# Patient Record
Sex: Female | Born: 1935 | Race: White | Hispanic: No | Smoking: Never smoker
Health system: Southern US, Community
[De-identification: ages and names within clinical notes are randomized; demographics above are authoritative.]

## PROBLEM LIST (undated history)

## (undated) DIAGNOSIS — R9431 Abnormal electrocardiogram [ECG] [EKG]: Secondary | ICD-10-CM

## (undated) DIAGNOSIS — R51 Headache: Secondary | ICD-10-CM

## (undated) DIAGNOSIS — K219 Gastro-esophageal reflux disease without esophagitis: Secondary | ICD-10-CM

## (undated) DIAGNOSIS — N289 Disorder of kidney and ureter, unspecified: Secondary | ICD-10-CM

## (undated) DIAGNOSIS — Z860100 Personal history of colon polyps, unspecified: Secondary | ICD-10-CM

## (undated) DIAGNOSIS — M1990 Unspecified osteoarthritis, unspecified site: Secondary | ICD-10-CM

## (undated) DIAGNOSIS — E210 Primary hyperparathyroidism: Secondary | ICD-10-CM

## (undated) DIAGNOSIS — R0989 Other specified symptoms and signs involving the circulatory and respiratory systems: Secondary | ICD-10-CM

## (undated) DIAGNOSIS — E039 Hypothyroidism, unspecified: Secondary | ICD-10-CM

## (undated) DIAGNOSIS — F419 Anxiety disorder, unspecified: Secondary | ICD-10-CM

## (undated) DIAGNOSIS — I10 Essential (primary) hypertension: Secondary | ICD-10-CM

## (undated) HISTORY — DX: Primary hyperparathyroidism: E21.0

## (undated) HISTORY — DX: Anxiety disorder, unspecified: F41.9

## (undated) HISTORY — PX: TONSILLECTOMY AND ADENOIDECTOMY: SUR1326

## (undated) HISTORY — PX: APPENDECTOMY: SHX54

## (undated) HISTORY — DX: Disorder of kidney and ureter, unspecified: N28.9

## (undated) HISTORY — DX: Personal history of colonic polyps: Z86.010

## (undated) HISTORY — DX: Hypothyroidism, unspecified: E03.9

## (undated) HISTORY — DX: Headache: R51

## (undated) HISTORY — DX: Abnormal electrocardiogram (ECG) (EKG): R94.31

## (undated) HISTORY — DX: Essential (primary) hypertension: I10

## (undated) HISTORY — DX: Unspecified osteoarthritis, unspecified site: M19.90

## (undated) HISTORY — DX: Personal history of colon polyps, unspecified: Z86.0100

## (undated) HISTORY — DX: Other specified symptoms and signs involving the circulatory and respiratory systems: R09.89

## (undated) HISTORY — DX: Gastro-esophageal reflux disease without esophagitis: K21.9

---

## 2004-09-23 ENCOUNTER — Ambulatory Visit: Payer: Self-pay | Admitting: Pulmonary Disease

## 2004-10-05 ENCOUNTER — Ambulatory Visit: Payer: Self-pay | Admitting: Pulmonary Disease

## 2005-09-22 ENCOUNTER — Ambulatory Visit: Payer: Self-pay | Admitting: Pulmonary Disease

## 2006-10-16 ENCOUNTER — Ambulatory Visit: Payer: Self-pay | Admitting: Pulmonary Disease

## 2006-10-16 LAB — CONVERTED CEMR LAB
Albumin: 3.7 g/dL (ref 3.5–5.2)
Basophils Relative: 1 % (ref 0.0–1.0)
CO2: 27 meq/L (ref 19–32)
Calcium, Total (PTH): 10.7 mg/dL — ABNORMAL HIGH (ref 8.4–10.5)
Calcium: 11 mg/dL — ABNORMAL HIGH (ref 8.4–10.5)
Eosinophils Relative: 2.8 % (ref 0.0–5.0)
HCT: 41.8 % (ref 36.0–46.0)
Hemoglobin: 14.5 g/dL (ref 12.0–15.0)
Hgb A1c MFr Bld: 6.5 % — ABNORMAL HIGH (ref 4.6–6.0)
MCHC: 34.8 g/dL (ref 30.0–36.0)
MCV: 83.1 fL (ref 78.0–100.0)
Monocytes Absolute: 0.8 10*3/uL — ABNORMAL HIGH (ref 0.2–0.7)
Neutrophils Relative %: 62.1 % (ref 43.0–77.0)
PTH: 42.4 pg/mL (ref 14.0–72.0)
Platelets: 309 10*3/uL (ref 150–400)
Potassium: 3.7 meq/L (ref 3.5–5.1)
RBC: 5.03 M/uL (ref 3.87–5.11)
RDW: 12.7 % (ref 11.5–14.6)
Total Bilirubin: 0.9 mg/dL (ref 0.3–1.2)
Total Protein: 8.1 g/dL (ref 6.0–8.3)
WBC: 10.6 10*3/uL — ABNORMAL HIGH (ref 4.5–10.5)

## 2007-12-10 DIAGNOSIS — J45909 Unspecified asthma, uncomplicated: Secondary | ICD-10-CM

## 2007-12-10 DIAGNOSIS — K219 Gastro-esophageal reflux disease without esophagitis: Secondary | ICD-10-CM | POA: Insufficient documentation

## 2007-12-10 DIAGNOSIS — I10 Essential (primary) hypertension: Secondary | ICD-10-CM

## 2007-12-10 DIAGNOSIS — F419 Anxiety disorder, unspecified: Secondary | ICD-10-CM

## 2007-12-11 ENCOUNTER — Ambulatory Visit: Payer: Self-pay | Admitting: Family Medicine

## 2007-12-11 ENCOUNTER — Ambulatory Visit: Payer: Self-pay | Admitting: Pulmonary Disease

## 2007-12-11 DIAGNOSIS — D126 Benign neoplasm of colon, unspecified: Secondary | ICD-10-CM | POA: Insufficient documentation

## 2007-12-11 DIAGNOSIS — E039 Hypothyroidism, unspecified: Secondary | ICD-10-CM

## 2007-12-12 ENCOUNTER — Telehealth: Payer: Self-pay | Admitting: Pulmonary Disease

## 2007-12-23 DIAGNOSIS — M1990 Unspecified osteoarthritis, unspecified site: Secondary | ICD-10-CM | POA: Insufficient documentation

## 2007-12-23 LAB — CONVERTED CEMR LAB
BUN: 32 mg/dL — ABNORMAL HIGH (ref 6–23)
Basophils Absolute: 0.1 10*3/uL (ref 0.0–0.1)
Basophils Relative: 0.8 % (ref 0.0–1.0)
Bilirubin, Direct: 0.1 mg/dL (ref 0.0–0.3)
Calcium, Total (PTH): 10.6 mg/dL — ABNORMAL HIGH (ref 8.4–10.5)
Chloride: 105 meq/L (ref 96–112)
Eosinophils Absolute: 0.2 10*3/uL (ref 0.0–0.6)
Eosinophils Relative: 2.1 % (ref 0.0–5.0)
GFR calc Af Amer: 35 mL/min
GFR calc non Af Amer: 29 mL/min
Lymphocytes Relative: 29.9 % (ref 12.0–46.0)
MCHC: 31.9 g/dL (ref 30.0–36.0)
Monocytes Absolute: 0.5 10*3/uL (ref 0.2–0.7)
Neutro Abs: 5.8 10*3/uL (ref 1.4–7.7)
PTH: 50.2 pg/mL (ref 14.0–72.0)
Phosphorus: 3.2 mg/dL (ref 2.3–4.6)
Platelets: 247 10*3/uL (ref 150–400)
Potassium: 3.8 meq/L (ref 3.5–5.1)
RDW: 13.5 % (ref 11.5–14.6)
Sodium: 140 meq/L (ref 135–145)
Total Bilirubin: 0.8 mg/dL (ref 0.3–1.2)
VLDL: 28 mg/dL (ref 0–40)
Vit D, 1,25-Dihydroxy: 34 (ref 30–89)

## 2008-01-22 ENCOUNTER — Encounter: Payer: Self-pay | Admitting: Pulmonary Disease

## 2009-02-25 ENCOUNTER — Ambulatory Visit: Payer: Self-pay | Admitting: Pulmonary Disease

## 2009-02-26 DIAGNOSIS — N259 Disorder resulting from impaired renal tubular function, unspecified: Secondary | ICD-10-CM

## 2009-02-26 DIAGNOSIS — M810 Age-related osteoporosis without current pathological fracture: Secondary | ICD-10-CM | POA: Insufficient documentation

## 2009-02-28 LAB — CONVERTED CEMR LAB
AST: 17 units/L (ref 0–37)
Albumin: 3.9 g/dL (ref 3.5–5.2)
Basophils Absolute: 0 10*3/uL (ref 0.0–0.1)
Basophils Relative: 0.4 % (ref 0.0–3.0)
Bilirubin, Direct: 0.1 mg/dL (ref 0.0–0.3)
Calcium, Total (PTH): 11.2 mg/dL — ABNORMAL HIGH (ref 8.4–10.5)
Eosinophils Absolute: 0.2 10*3/uL (ref 0.0–0.7)
Eosinophils Relative: 2.5 % (ref 0.0–5.0)
Glucose, Bld: 138 mg/dL — ABNORMAL HIGH (ref 70–99)
Hgb A1c MFr Bld: 6 % (ref 4.6–6.5)
Lymphocytes Relative: 26.3 % (ref 12.0–46.0)
Lymphs Abs: 2.2 10*3/uL (ref 0.7–4.0)
MCV: 83 fL (ref 78.0–100.0)
Monocytes Absolute: 0.5 10*3/uL (ref 0.1–1.0)
Monocytes Relative: 5.6 % (ref 3.0–12.0)
Neutrophils Relative %: 65.2 % (ref 43.0–77.0)
Phosphorus: 3.7 mg/dL (ref 2.3–4.6)
Platelets: 259 10*3/uL (ref 150.0–400.0)
RDW: 12.6 % (ref 11.5–14.6)
TSH: 1.27 microintl units/mL (ref 0.35–5.50)
Total Bilirubin: 0.8 mg/dL (ref 0.3–1.2)
Total Protein: 7.9 g/dL (ref 6.0–8.3)

## 2009-03-31 ENCOUNTER — Ambulatory Visit: Payer: Self-pay | Admitting: Pulmonary Disease

## 2010-01-27 ENCOUNTER — Ambulatory Visit: Payer: Self-pay | Admitting: Pulmonary Disease

## 2010-01-27 DIAGNOSIS — R9431 Abnormal electrocardiogram [ECG] [EKG]: Secondary | ICD-10-CM

## 2010-01-27 DIAGNOSIS — R0989 Other specified symptoms and signs involving the circulatory and respiratory systems: Secondary | ICD-10-CM | POA: Insufficient documentation

## 2010-01-31 LAB — CONVERTED CEMR LAB
Albumin: 4.1 g/dL (ref 3.5–5.2)
Alkaline Phosphatase: 67 units/L (ref 39–117)
CO2: 28 meq/L (ref 19–32)
Chloride: 105 meq/L (ref 96–112)
Creatinine, Ser: 1.5 mg/dL — ABNORMAL HIGH (ref 0.4–1.2)
Eosinophils Relative: 0.7 % (ref 0.0–5.0)
GFR calc non Af Amer: 35.54 mL/min (ref >60)
HCT: 38.3 % (ref 36.0–46.0)
Hemoglobin: 13 g/dL (ref 12.0–15.0)
Hgb A1c MFr Bld: 5.8 % (ref 4.6–6.5)
Lymphocytes Relative: 18.9 % (ref 12.0–46.0)
MCHC: 33.8 g/dL (ref 30.0–36.0)
Neutro Abs: 6.6 10*3/uL (ref 1.4–7.7)
Neutrophils Relative %: 76.9 % (ref 43.0–77.0)
Platelets: 215 10*3/uL (ref 150.0–400.0)
Potassium: 3.6 meq/L (ref 3.5–5.1)
RBC: 4.47 M/uL (ref 3.87–5.11)
Total Protein: 7 g/dL (ref 6.0–8.3)

## 2010-02-02 LAB — CONVERTED CEMR LAB
Calcium, Total (PTH): 10.5 mg/dL (ref 8.4–10.5)
Vit D, 25-Hydroxy: 61 ng/mL (ref 30–89)

## 2010-10-17 ENCOUNTER — Encounter: Payer: Self-pay | Admitting: Pulmonary Disease

## 2010-10-27 NOTE — Assessment & Plan Note (Signed)
Summary: rov/apc   CC:  10 month ROV & review of mult medical problems....  History of Present Illness: [name] here for a follow up visit... she has mult med problems as listed below...    ~  Jun10:  she states that BP & BS have been doing well at home- taking meds regularly and tolerating well... started on Alendronate for Osteoporosis 3/09 and taking this weekly & tol well... after her last visit 3/09 she was supposed to see her GYN for check up (never went) & get an appt w/ GI for a follow up colonoscopy (never went)... she was also supposed to f/u in 6 months and never returned as scheduled... she states she's had a good yr & been very busy raising her grandkids... her CC is "my feet" c/o pain and walks w/ crutches... prev eval by [facility]- DrCollins...      ** labs ret w/ BS=138, A1c=6.0, BUN=46, Creat=2.2, otherw OK- we decided to stop Maxzide, add Prednisone 10mg /d...  ~  Jul10:  she reports feeling much better w/ the 10mg Pred- breathing "the best in yrs" & arthritis improved... due for f/u lab off the Maxzide... try to wean the Pred to 06-30-09-5...      ** labs improved w/ BS=107, Creat=1.6 off Maxzide.   ~  Jan 27, 2010:  she never ret for f/u visit & weaned the Pred on her own to 1/2 tab=5mg  Qod... she notes breathing has remained good & arthritis improved- "all except my feet" & still walks w/ canes (offered eval by [facility] & she will think about it)...  she reports Asthma stable on meds;  BP controlled on Lisinopril w/o side effects;  remains on Metformin daily & not checking BS at home;  due for f/u thyroid & parathyroid labs, etc <SEE BELOW> she refuses yearly Flu vaccines but will accept a TDAP today.    Current Problems:   ASTHMA (ICD-493.90) - she is a non-smoker... takes TheoDur 300mg /d, PROAIR HFA 2spBid &Prn, FLOVENT 220 2spBid, & the PREDNISONE 10mg /d- now 1/2 Qod... no exas, doing well, no recent URI, etc... denies cough, sputum, hemoptysis, worsening dyspnea,   wheezing, chest pains, snoring, daytime hypersomnolence, etc...  ~  baseline CXR w/ hyperinflation, tort Ao, NAD... f/u film 3/09 showed COPD, NAD.Marland Kitchen.  ~  CXR 5/11 showed enlarged heart, ectatic Ao, hyperinflation, NAD...  HYPERTENSION (ICD-401.9) - on LISINOPRIL 10mg /d... BP=138/72, tol Rx well, no problems... denies HA, fatigue, visual changes, CP, palipit, dizziness, syncope, dyspnea, etc... notes tr edema intermittently.  ~  cardiac cath 1991 was neg- norm coronaries (she may have had spasm).  ~  labs 3/09 showed K=3.8, BUN= 32, Creat= 1.8  ~  labs 6/10 showed K= 4.4, BUN= 46, Creat= 2.2.Marland Kitchen. rec> stop Maxzide & KCl, ROV 39mo.  ~  labs 7/10 showed K= 4.6, BUN= 58, Creat= 1.6.Marland Kitchen. rec> incr fluid intake, stay off Maxzide.  ABNORMAL ELECTROCARDIOGRAM (ICD-794.31) - EKG shows NSR, IVCD, LAD, poor R progression...  ~  2DEcho 5/11 showed > pending  CAROTID BRUIT (ICD-785.9) - on ASA 81mg /d... she denies cerebral ischemic symptoms...  ~  CDopplers 5/11 showed > pending  DIABETES MELLITUS (ICD-250.00) - on METFORMIN ER 500mg /d... tol well, no hypogly reactions etc...  ~  labs 1/08 showed BS=172, HgA1c=6.5 & Metformin 500mg /d started...  ~  labs 3/09 showed BS= 99, A1c= 5.7  ~  labs 6/10 showed BS= 138, A1c= 6.0.Marland Kitchen. rec> same med.  ~  labs 5/11 showed BS= 141, A1c= 5.8  HYPOTHYROIDISM (ICD-244.9) - on LEVOTHYROID 75 micrograms/d... clinically Euthyroid w/ good energy.  ~  labs 1/08 showed TSH=1.84 on 75 micrograms/d...  ~  labs 3/09 showed TSH= 5.05  ~  labs 6/10 showed TSH= 1.27  ~  labs 5/11 showed TSH= 0.64  ? of PRIMARY HYPERPARATHYROIDISM (ICD-252.01) - Ca++ levels in the 10.6-11.0 range... PO4- levels in the 2.1-2.4 range... PTH levels slowly rising 40-65... therefore following clinically w/ serial labs due now.  ~  labs 1/08 showed:  ca++ 11.0, PTH 42.4.Marland KitchenMarland KitchenMarland Kitchen  ~  labs 3/09 showed Ca= 10.3, PO4= 3.2, PTH= 50  ~  labs 6/10 showed Ca= 10.9, PO4= 3.7, PTH= 65  ~  labs 5/11 showed Ca= 10.5-10.9,  Phos= 3.0, PTH= 111... rec> continue to follow closely.  GERD (ICD-530.81) - remote hx of PUD w/ GIB in the 80's... eval by DrDBrodie in the past... prev on Zantac and Reglan but hasn't filled these since 2009... denies nausea, vomiting, heartburn, diarrhea, constipation, blood in stool, abdominal pain, swelling, gas... she is rec to use OTC Prilosec vs Zantac Prn & avoid Tums etc...  COLONIC POLYPS (ICD-211.3) - there is a pos family history of colon cancer (in brother)... last colonoscopy was 5/98 [facility]) showing diminutive polyp (tubular adenoma)... f/u planned 3-5 yrs, but she never went... she is overdue and needs to sched this important procedure & we will refer to GI for their review...  RENAL INSUFFICIENCY (ICD-588.9)   ~  labs 1/08 showed BUN= 21, Creat= 1.2  ~  labs 3/09 showed BUN= 32, Creat= 1.8  ~  labs 6/10 showed BUN= 46, Creat= 2.2.Marland KitchenMarland Kitchen rec to stop Maxzide (on 1/2 daily)  ~  labs 7/10 showed BUN= 58, Creat= 1.6.Marland Kitchen. rec> incr fluid intake.  ~  labs 5/11 showed BUN= 28, Creat= 1.5  HEADACHE (ICD-784.0)  DEGENERATIVE JOINT DISEASE (ICD-715.90) - mod DJD and walks w/ crutches "because of my ankles"... she's seen DrCollins [facility] in the past... started on low dose Pred 6/10 for this & Asthma> still on PRED 10mg - 1/2 Qod...  ~  6/10: CC= "my feet" c/o arthritic pain in feet & ankles... rec> trial Pred 10mg /d w/ ROV 76mo.  ~  7/10: pt states improved on the Pred which has been weaned...  OSTEOPOROSIS (ICD-733.00) - currently taking FOSAMAX 70mg /wk, no calcium supplement due to borderline hypercalcemia, MVI + Vit D 1000 u OTC supplement.  ~  BMD 3/09 here showed TScores -2.9 in Spine, and -2.9 in right FemNeck... started on Alendronate 70mg /wk.  ~  Vit D level 3/09 = 34... rec to start Vit D 1000 u OTC supplement.  ~  Vit D level 6/10 = 50  ~  Vit D level 5/11 = 61  ANXIETY (ICD-300.00)  HEALTH MAINTENANCE:  ~  GYN: she hasn't seen GYN (DrCollins) in yrs and needs PAP,  Mammogram, etc... 3/09- reminded to call for GYN follow up but she never went... pt reminded again 6/10 & 5/11 to call & set this up.  ~  GI:  last colonoscopy 1998 at [facility] by [facility]- sm adenoma removed... advised f/u 29yrs and she never went... referral made to [facility] for this important procedure.  ~  Immunizations:  had Pneumovax 2001... Tetanus (TDAP) given 5/11... she refuses the seasonal Flu vaccines & is reminded of the importance of these yearly vaccinations.   Current Medications (verified): 1)  Prednisone 10 Mg Tabs (Prednisone) .... Take 1 Tab By Mouth One Day & 1/2 Tab The Next... 2)  Proair Hfa 108 (90  Base) Mcg/act  Aers (Albuterol Sulfate) .... 2 Puffs Four Times A Day As Directed... 3)  Flovent Hfa 220 Mcg/act  Aero (Fluticasone Propionate  Hfa) .... Inhale 2 Puffs Two Times A Day 4)  Theophylline Cr 300 Mg  Tb12 (Theophylline) .... Take 1 By Mouth Daily.Marland KitchenMarland Kitchen 5)  Adult Aspirin Low Strength 81 Mg  Tbdp (Aspirin) .... Take 1 By Mouth Once Daily 6)  Lisinopril 10 Mg  Tabs (Lisinopril) .... Take 1 By Mouth Once Daily 7)  Metformin Hcl 500 Mg  Tb24 (Metformin Hcl) .... Take 1 Tablet By Mouth Once A Day 8)  Levothroid 75 Mcg  Tabs (Levothyroxine Sodium) .... Take 1 By Mouth Once Daily 9)  Fosamax 70 Mg  Tabs (Alendronate Sodium) .... Take One Tablet By Mouth Every Week 10)  Vitamin D 1000 Unit Tabs (Cholecalciferol) .... Take 1 Tablet By Mouth Once A Day  Allergies (verified): 1)  ! Augmentin 2)  ! Codeine 3)  ! Keflex  Past History:  Past Medical History: ASTHMA (ICD-493.90) HYPERTENSION (ICD-401.9) ABNORMAL ELECTROCARDIOGRAM (ICD-794.31) CAROTID BRUIT (ICD-785.9) DIABETES MELLITUS (ICD-250.00) HYPOTHYROIDISM (ICD-244.9) ? of PRIMARY HYPERPARATHYROIDISM (ICD-252.01) GERD (ICD-530.81) COLONIC POLYPS (ICD-211.3) RENAL INSUFFICIENCY (ICD-588.9) HEADACHE (ICD-784.0) DEGENERATIVE JOINT DISEASE (ICD-715.90) OSTEOPOROSIS (ICD-733.00) ANXIETY (ICD-300.00)  Past Surgical  History: S/P T & A S/P appendectomy  Family History: Reviewed history from 03/31/2009 and no changes required. mother deceased age 84 from a cerebral hemorrhage father deceased age 55 from pneumonia/CHF 7 Siblings- 2 Sis & 5 Bro: 1 sister alive  1 sister deceased age 41 from chf 1 brother deceased age 62 from an accidental shooting 1 brother deceased in his 49's with lung cancer 1 brother deceased in his 40's with leukemia 1 brother deceased in his 55's with colon cancer 1 brother deceased in his 36's with stomach cancer  Social History: Reviewed history from 03/31/2009 and no changes required. never smoked never used alcohol no exercise due to arthritis no caffeine use  widowed 6 children  Review of Systems      See HPI       The patient complains of decreased hearing, dyspnea on exertion, and difficulty walking.  The patient denies anorexia, fever, weight loss, weight gain, vision loss, hoarseness, chest pain, syncope, peripheral edema, prolonged cough, headaches, hemoptysis, abdominal pain, melena, hematochezia, severe indigestion/heartburn, hematuria, incontinence, muscle weakness, suspicious skin lesions, transient blindness, depression, unusual weight change, abnormal bleeding, enlarged lymph nodes, and angioedema.    Vital Signs:  Patient profile:   77 year old female Height:      66 inches Weight:      142.0 pounds BMI:     23.00 O2 Sat:      98 % on room air Temp:     98.0 degrees F oral Pulse rate:   80 / minute BP sitting:   126 / 74  (left arm) Cuff size:   regular  Vitals Entered By: Denna Haggard, CMA (Jan 27, 2010 2:31 PM)  O2 Sat at Rest %:  98% O2 Flow:  room air  Physical Exam  Additional Exam:  WD, WN, [name] in NAD... GENERAL:  Alert & oriented; pleasant & cooperative... HEENT:  [place]/AT, EOM-wnl, PERRLA, EACs-clear, TMs-wnl, NOSE-clear, THROAT-clear & wnl. NECK:  Supple w/ fairROM; no JVD; normal carotid impulses w/ 1+ bilat bruits; no  thyromegaly or nodules palpated; no lymphadenopathy. CHEST:  decr BS bilat, clear to P & A; without wheezes or rales; + scat rhonchi heard...  HEART:  Regular Rhythm; +gr1-2  sys murmur, no rubs or gallops detected... ABDOMEN:  Soft & nontender; normal bowel sounds; no organomegaly or masses palpated... EXT: without deformities, mild arthritic changes; no varicose veins/ venous insuffic/ or edema. +gait abn using 2 canes, stooped over, etc... NEURO:  CN's intact;  no focal neuro deficits... DERM:  No lesions noted; no rash etc...    CXR  Procedure date:  01/27/2010  Findings:      CHEST - 2 VIEW Comparison:  chest radiograph 12/11/1998   Findings: Stable enlarged heart silhouette with ectatic aorta. Lungs are hyperinflated.  No evidence effusion, infiltrate, or pneumothorax.  No acute bony abnormality.   IMPRESSION:   1.  Stable exam of the chest. 2.  Hyperinflation suggests emphysema.   Read By:  Genevive Bi,  M.D.   EKG  Procedure date:  01/27/2010  Findings:      Normal sinus rhythm with rate of:  88/min... Abn tracing w/ IVCD, LAD, poor R progression...  SN   MISC. Report  Procedure date:  01/27/2010  Findings:      BMP (METABOL)   Sodium                    140 mEq/L                   135-145   Potassium                 3.6 mEq/L                   3.5-5.1   Chloride                  105 mEq/L                   96-112   Carbon Dioxide            28 mEq/L                    19-32   Glucose              [H]  141 mg/dL                   57-32   BUN                  [H]  28 mg/dL                    2-02   Creatinine           [H]  1.5 mg/dL                   5.4-2.7   Calcium              [H]  10.9 mg/dL                  0.6-23.7   GFR                       35.54 mL/min                >60  Hepatic/Liver Function Panel (HEPATIC)   Total Bilirubin           0.5 mg/dL                   6.2-8.3   Direct Bilirubin          0.1  mg/dL                   3.0-8.6    Alkaline Phosphatase      67 U/L                      39-117   AST                       17 U/L                      0-37   ALT                       13 U/L                      0-35   Total Protein             7.0 g/dL                    5.7-8.4   Albumin                   4.1 g/dL                    6.9-6.2  CBC Platelet w/Diff (CBCD)   White Cell Count          8.6 K/uL                    4.5-10.5   Red Cell Count            4.47 Mil/uL                 3.87-5.11   Hemoglobin                13.0 g/dL                   95.2-84.1   Hematocrit                38.3 %                      36.0-46.0   MCV                       85.8 fl                     78.0-100.0   Platelet Count            215.0 K/uL                  150.0-400.0   Neutrophil %              76.9 %                      43.0-77.0   Lymphocyte %              18.9 %                      12.0-46.0   Monocyte %                3.2 %                       3.0-12.0  Eosinophils%              0.7 %                       0.0-5.0   Basophils %               0.3 %                       0.0-3.0  Comments:      TSH (TSH)   FastTSH                   0.64 uIU/mL                 0.35-5.50  Phosphorus (PHOS)   Phosphorus                3.0 mg/dL                   1.6-1.0  Hemoglobin A1C (A1C)   Hemoglobin A1C            5.8 %                       4.6-6.5  Vitamin D (25-Hydroxy) (96045)  Vitamin D (25-Hydroxy)                             61 ng/mL                    30-89  Parathyroid Hormone, Intact with Ca (40981)   Parathyroid Hormone  [H]  110.6 pg/mL                 14.0-72.0   Calcium                   10.5 mg/dL                  1.9-14.7   Impression & Recommendations:  Problem # 1:  ASTHMA (ICD-493.90) Stable on the PRED 5mg  Qod, Advair Bid, Theodur (she wants to continue), & the Proair Prn... we decided to continue the same Rx. Her updated medication list for this problem includes:    Prednisone 10 Mg Tabs (Prednisone)  .Marland Kitchen... Take 1/2 tab by mouth every other day.    Proair Hfa 108 (90 Base) Mcg/act Aers (Albuterol sulfate) .Marland Kitchen... 2 puffs four times a day as directed...    Flovent Hfa 220 Mcg/act Aero (Fluticasone propionate  hfa) ..... Inhale 2 puffs two times a day    Theophylline Cr 300 Mg Tb12 (Theophylline) .Marland Kitchen... Take 1 by mouth daily...  Orders: T-2 View CXR (71020TC)  Problem # 2:  HYPERTENSION (ICD-401.9) Controlled on med & tol well-  continue the same for now. Her updated medication list for this problem includes:    Lisinopril 10 Mg Tabs (Lisinopril) .Marland Kitchen... Take 1 by mouth once daily  Orders: 12 Lead EKG (12 Lead EKG) T-2 View CXR (71020TC) TLB-BMP (Basic Metabolic Panel-BMET) (80048-METABOL) TLB-Hepatic/Liver Function Pnl (80076-HEPATIC) TLB-CBC Platelet - w/Differential (85025-CBCD) TLB-TSH (Thyroid Stimulating Hormone) (84443-TSH) TLB-Phosphorus (84100-PHOS) TLB-A1C / Hgb A1C (Glycohemoglobin) (83036-A1C) T-Parathyroid Hormone, Intact w/ Calcium (82956-21308) T-Vitamin D (25-Hydroxy) (65784-69629) 2 D Echo (2 D Echo)  Problem # 3:  ABNORMAL ELECTROCARDIOGRAM (ICD-794.31) She denies CP, palpit, ch in SOB, swelling, etc... we will check 2DEcho & see if further cardiac eval is [employer]  at this time... Orders: 2 D Echo (2 D Echo)  Problem # 4:  CAROTID BRUIT (ICD-785.9) She is asymptomatic on the ASA... we will check CDopplers & notify pt of the results when avail... Orders: 2 D Echo (2 D Echo) Carotid Doppler (Carotid doppler)  Problem # 5:  DIABETES MELLITUS (ICD-250.00) Stable on the Metformin + diet Rx... continue same for now. Her updated medication list for this problem includes:    Adult Aspirin Low Strength 81 Mg Tbdp (Aspirin) .Marland Kitchen... Take 1 by mouth once daily    Lisinopril 10 Mg Tabs (Lisinopril) .Marland Kitchen... Take 1 by mouth once daily    Metformin Hcl 500 Mg Tb24 (Metformin hcl) .Marland Kitchen... Take 1 tablet by mouth once a day  Problem # 6:  HYPOTHYROIDISM (ICD-244.9) Stable on the  Levothy75-  continue same for now. Her updated medication list for this problem includes:    Levothroid 75 Mcg Tabs (Levothyroxine sodium) .Marland Kitchen... Take 1 by mouth once daily  Problem # 7:  ? of PRIMARY HYPERPARATHYROIDISM (ICD-252.01) Calcium at 10.9= no change... PTH shows steady rise c/w parathyroid adenoma & we are following...  Problem # 8:  COLONIC POLYPS (ICD-211.3) GI>  she is overdue for colonoscopy w/ hx polyps and brother w/ colon cancer... Orders: Gastroenterology Referral (GI)  Problem # 9:  RENAL INSUFFICIENCY (ICD-588.9) Labs stable to improved...  Problem # 10:  DEGENERATIVE JOINT DISEASE (ICD-715.90) Offered referral to Ortho drBednarz to check feet & she will decide... Her updated medication list for this problem includes:    Adult Aspirin Low Strength 81 Mg Tbdp (Aspirin) .Marland Kitchen... Take 1 by mouth once daily  Complete Medication List: 1)  Prednisone 10 Mg Tabs (Prednisone) .... Take 1/2 tab by mouth every other day. 2)  Proair Hfa 108 (90 Base) Mcg/act Aers (Albuterol sulfate) .... 2 puffs four times a day as directed... 3)  Flovent Hfa 220 Mcg/act Aero (Fluticasone propionate  hfa) .... Inhale 2 puffs two times a day 4)  Theophylline Cr 300 Mg Tb12 (Theophylline) .... Take 1 by mouth daily.Marland KitchenMarland Kitchen 5)  Adult Aspirin Low Strength 81 Mg Tbdp (Aspirin) .... Take 1 by mouth once daily 6)  Lisinopril 10 Mg Tabs (Lisinopril) .... Take 1 by mouth once daily 7)  Metformin Hcl 500 Mg Tb24 (Metformin hcl) .... Take 1 tablet by mouth once a day 8)  Levothroid 75 Mcg Tabs (Levothyroxine sodium) .... Take 1 by mouth once daily 9)  Fosamax 70 Mg Tabs (Alendronate sodium) .... Take one tablet by mouth every week 10)  Womens Multivitamin Plus Tabs (Multiple vitamins-minerals) .... Take 1 tab by mouth once daily. 11)  Vitamin D 1000 Unit Tabs (Cholecalciferol) .... Take 1 tablet by mouth once a day  Other Orders: Tdap => 68yrs IM (64403) Admin 1st Vaccine (47425)  Patient Instructions: 1)   Today we updated your med list- see below.... 2)  Keep the Prednisone at 1/2 tab every other day... 3)  Today we did your follow up CXR, EKG, & blood work... please call the "phone tree" in a few days for your lab results.Marland KitchenMarland Kitchen 4)  We will sched for you a 2D Echocardiogram to better assess the heart murmur & a Carotid doppler to be sure you are not at increased risk of a stroke... we will call you w/ these reports when avail.Marland KitchenMarland Kitchen 5)  We gave you the combination Tetanus shot today called the TDAP (it should be good for 31yrs)... 6)  Try to stay as active as possible... 7)  Call  for any problems... 8)  Please schedule a follow-up appointment in 6 months.   [facility] ECG  ID: [id number] Patient: Ashley Norman, Ashley Norman DOB: [birth year] Age: 77 Years Old Sex: Female Race: White Physician: Dr Kriste Basque Technician: Denna Haggard, CMA Height: 66 Weight: 142.0 Status: Unconfirmed Past Medical History:  ASTHMA (ICD-493.90) HYPERTENSION (ICD-401.9) DIABETES MELLITUS (ICD-250.00) HYPOTHYROIDISM (ICD-244.9) ? of PRIMARY HYPERPARATHYROIDISM (ICD-252.01) GERD (ICD-530.81) COLONIC POLYPS (ICD-211.3) RENAL INSUFFICIENCY (ICD-588.9) HEADACHE (ICD-784.0) DEGENERATIVE JOINT DISEASE (ICD-715.90) OSTEOPOROSIS (ICD-733.00) ANXIETY (ICD-300.00)   Recorded: 01/27/2010 3:31 PM P/PR: 113 ms / 157 ms - Heart rate (maximum exercise) QRS: 110 QT/QTc/QTd: 363 ms / 410 ms / 54 ms - Heart rate (maximum exercise)  P/QRS/T axis: 79 deg / -68 deg / 75 deg - Heart rate (maximum exercise)  Heartrate: 87 bpm  Interpretation:  Normal sinus rhythm with rate of:  88/min... Abn tracing w/ IVCD, LAD, poor R progression...  SN     Orders Added: 1)  Est. Patient Level V [99215] 2)  12 Lead EKG [12 Lead EKG] 3)  T-2 View CXR [71020TC] 4)  TLB-BMP (Basic Metabolic Panel-BMET) [80048-METABOL] 5)  TLB-Hepatic/Liver Function Pnl [80076-HEPATIC] 6)  TLB-CBC Platelet - w/Differential [85025-CBCD] 7)  TLB-TSH  (Thyroid Stimulating Hormone) [84443-TSH] 8)  TLB-Phosphorus [84100-PHOS] 9)  TLB-A1C / Hgb A1C (Glycohemoglobin) [83036-A1C] 10)  T-Parathyroid Hormone, Intact w/ Calcium [74259-56387] 11)  T-Vitamin D (25-Hydroxy) [56433-29518] 12)  2 D Echo [2 D Echo] 13)  Carotid Doppler [Carotid doppler] 14)  Tdap => 53yrs IM [90715] 15)  Admin 1st Vaccine [84166] 16)  Gastroenterology Referral [GI]    Immunizations Administered:  Tetanus Vaccine:    Vaccine Type: Tdap    Site: left deltoid    Mfr: boostrix    Dose: 0.5 ml    Route: IM    Given by: Randell Loop CMA    Exp. Date: 12/19/2011    Lot #: [id number]    VIS given: 08/14/07 version given Jan 27, 2010.

## 2010-12-02 ENCOUNTER — Ambulatory Visit: Payer: Self-pay | Admitting: Pulmonary Disease

## 2010-12-03 ENCOUNTER — Telehealth: Payer: Self-pay | Admitting: Pulmonary Disease

## 2010-12-03 ENCOUNTER — Ambulatory Visit: Payer: Self-pay | Admitting: Pulmonary Disease

## 2010-12-07 NOTE — Progress Notes (Addendum)
Summary: nos appt  Phone Note Call from Patient   Caller: [email or web address]  Call For: Any Mcneice Summary of Call: ATC pt to rsc nos from 3/8 nos vm. Initial call taken by: Darletta Moll,  December 03, 2010 10:45 AM

## 2010-12-21 ENCOUNTER — Other Ambulatory Visit: Payer: Self-pay | Admitting: Pulmonary Disease

## 2011-01-24 ENCOUNTER — Ambulatory Visit (INDEPENDENT_AMBULATORY_CARE_PROVIDER_SITE_OTHER)
Admission: RE | Admit: 2011-01-24 | Discharge: 2011-01-24 | Disposition: A | Discharge status: Home / Self Care | Payer: Medicare Other | Source: Ambulatory Visit | Attending: Pulmonary Disease | Admitting: Pulmonary Disease

## 2011-01-24 ENCOUNTER — Other Ambulatory Visit (INDEPENDENT_AMBULATORY_CARE_PROVIDER_SITE_OTHER): Payer: Medicare Other

## 2011-01-24 ENCOUNTER — Ambulatory Visit (INDEPENDENT_AMBULATORY_CARE_PROVIDER_SITE_OTHER)
Admission: RE | Admit: 2011-01-24 | Discharge: 2011-01-24 | Disposition: A | Discharge status: Home / Self Care | Payer: Medicare Other | Source: Ambulatory Visit

## 2011-01-24 ENCOUNTER — Encounter: Payer: Self-pay | Admitting: Pulmonary Disease

## 2011-01-24 ENCOUNTER — Ambulatory Visit (INDEPENDENT_AMBULATORY_CARE_PROVIDER_SITE_OTHER): Payer: Medicare Other | Admitting: Pulmonary Disease

## 2011-01-24 DIAGNOSIS — R0989 Other specified symptoms and signs involving the circulatory and respiratory systems: Secondary | ICD-10-CM

## 2011-01-24 DIAGNOSIS — N259 Disorder resulting from impaired renal tubular function, unspecified: Secondary | ICD-10-CM

## 2011-01-24 DIAGNOSIS — F411 Generalized anxiety disorder: Secondary | ICD-10-CM

## 2011-01-24 DIAGNOSIS — M810 Age-related osteoporosis without current pathological fracture: Secondary | ICD-10-CM

## 2011-01-24 DIAGNOSIS — J45909 Unspecified asthma, uncomplicated: Secondary | ICD-10-CM

## 2011-01-24 DIAGNOSIS — I10 Essential (primary) hypertension: Secondary | ICD-10-CM

## 2011-01-24 DIAGNOSIS — M1990 Unspecified osteoarthritis, unspecified site: Secondary | ICD-10-CM

## 2011-01-24 DIAGNOSIS — E039 Hypothyroidism, unspecified: Secondary | ICD-10-CM

## 2011-01-24 DIAGNOSIS — E119 Type 2 diabetes mellitus without complications: Secondary | ICD-10-CM

## 2011-01-24 DIAGNOSIS — E7800 Pure hypercholesterolemia, unspecified: Secondary | ICD-10-CM

## 2011-01-24 DIAGNOSIS — K219 Gastro-esophageal reflux disease without esophagitis: Secondary | ICD-10-CM

## 2011-01-24 DIAGNOSIS — E213 Hyperparathyroidism, unspecified: Secondary | ICD-10-CM | POA: Insufficient documentation

## 2011-01-24 LAB — BASIC METABOLIC PANEL
Calcium: 11 mg/dL — ABNORMAL HIGH (ref 8.4–10.5)
GFR: 38.39 mL/min — ABNORMAL LOW (ref >60.00)
Glucose, Bld: 100 mg/dL — ABNORMAL HIGH (ref 70–99)
Sodium: 136 mEq/L (ref 135–145)

## 2011-01-24 LAB — CBC WITH DIFFERENTIAL/PLATELET
Basophils Absolute: 0 10*3/uL (ref 0.0–0.1)
Eosinophils Relative: 6.5 % — ABNORMAL HIGH (ref 0.0–5.0)
HCT: 39.5 % (ref 36.0–46.0)
Hemoglobin: 13.5 g/dL (ref 12.0–15.0)
Lymphs Abs: 3 10*3/uL (ref 0.7–4.0)
MCV: 83.6 fl (ref 78.0–100.0)
Monocytes Absolute: 0.7 10*3/uL (ref 0.1–1.0)
Monocytes Relative: 6.4 % (ref 3.0–12.0)
Neutro Abs: 6.6 10*3/uL (ref 1.4–7.7)
RDW: 15.3 % — ABNORMAL HIGH (ref 11.5–14.6)

## 2011-01-24 LAB — HEPATIC FUNCTION PANEL
Albumin: 3.7 g/dL (ref 3.5–5.2)
Alkaline Phosphatase: 67 U/L (ref 39–117)
Total Bilirubin: 0.5 mg/dL (ref 0.3–1.2)

## 2011-01-24 LAB — LIPID PANEL
HDL: 62.3 mg/dL (ref >39.00)
LDL Cholesterol: 80 mg/dL (ref 0–99)
Total CHOL/HDL Ratio: 3
VLDL: 20.6 mg/dL (ref 0.0–40.0)

## 2011-01-24 LAB — HEMOGLOBIN A1C: Hgb A1c MFr Bld: 5.9 % (ref 4.6–6.5)

## 2011-01-24 NOTE — Patient Instructions (Signed)
Today we updated your med list in our EPIC system...    Continue your current meds the same...    For the congestion:  Be sure you are taking OTC MUCINEX 600mg  2 tabs twice daily w/ plenty of water...  Today we did your follow up fasting blood work...    Please call the PHONE TREE in a few days for your results...    Dial [id number] & when prompted enter your patient number followed by the # symbol...    Your patient number is:  [id number]#  We will also sched a follow up Bone Density test; and a Carotid Doppler to assess your risk of a stroke...    We will call you w/ these reports when avail...  Stay as active as possible... Call for any questions... Let's plan a follow up visit in 6 months.Marland KitchenMarland Kitchen

## 2011-01-24 NOTE — Progress Notes (Signed)
Subjective:    Patient ID: Ashley Norman, female    DOB: 07-16-1928, 77 y.o.   MRN: [id number]  HPI [name] here for a follow up visit... she has mult med problems as listed below...   ~  Jan 27, 2010:  she never ret for f/u visit & weaned the Pred on her own to 1/2 tab=5mg  Qod... she notes breathing has remained good & arthritis improved- "all except my feet" & still walks w/ canes (offered eval by Princess Perna & she will think about it)...  she reports Asthma stable on meds;  BP controlled on Lisinopril w/o side effects;  remains on Metformin daily & not checking BS at home;  due for f/u thyroid & parathyroid labs, etc <SEE BELOW> she refuses yearly Flu vaccines but will accept a TDAP today.  ~  January 24, 2011:  Yearly ROV "just getting older" she says;  She weaned off her Pred & hasn't taken this in months, breathing fair- some congestion, wheezing, cough... BP controlled;  Denies CP, plapit, cerebral ischemic symptoms;  She never did the CDopplers as requested last yr "I'm getting forgetful" she says;  Not checking sugars at home (last a1c= 5.8 to 6.0);  Due for f/u thyroid & parathyroid blood work as well... We will sched the CDopplers and f/u BMD at her convenience...         Problem List:    ASTHMA (ICD-493.90) - she is a non-smoker... takes TheoDur 300mg /d, PROAIR HFA 2spBid &Prn, FLOVENT 220 2spBid, & now off Pred... ~  baseline CXR w/ hyperinflation, tort Ao, NAD... f/u film 3/09 showed COPD, NAD.Marland Kitchen. ~  CXR 5/11 showed enlarged heart, ectatic Ao, hyperinflation, NAD... ~  4/12:  Notes some cough, congestion, intermittent wheezing> on Theo/ Proair/ Flovent;  Rec- Mucinex/ Fluids;  CXR at baseline- COPD, NAD...  HYPERTENSION (ICD-401.9) - on LISINOPRIL 10mg /d... ~  cardiac cath 1991 was neg- norm coronaries (she may have had spasm). ~  labs 3/09 showed K=3.8, BUN=32, Creat=1.8 ~  labs 6/10 showed K=4.4, BUN=46, Creat=2.2.Marland Kitchen. rec> stop Maxzide & KCl, ROV 92mo. ~  labs 7/10 showed K=4.6,  BUN=58, Creat=1.6.Marland Kitchen. rec> incr fluid intake, stay off Maxzide. ~  5/11:  BP=138/72, tol Rx well, no problems; denies HA, visual changes, CP, palipit, syncope, ch in dyspnea, etc; notes tr edema intermittently. ~  4/12:  BP= 138/84 & she remains essentially asymptomatic; K=3.9, BUN=25, Creat=1.4  ABNORMAL ELECTROCARDIOGRAM (ICD-794.31) - EKG shows NSR, IVCD, LAD, poor R progression... ~  2DEcho 5/11 ==> ordered but pt never returned to have this done...  CAROTID BRUIT (ICD-785.9) - on ASA 81mg /d... she denies cerebral ischemic symptoms... ~  CDopplers 5/11 ==> ordered but pt never returned to have this done...  DIABETES MELLITUS (ICD-250.00) - on METFORMIN ER 500mg /d... tol well, no hypogly reactions etc... ~  labs 1/08 showed BS=172, HgA1c=6.5 & Metformin 500mg /d started... ~  labs 3/09 showed BS= 99, A1c= 5.7 ~  labs 6/10 showed BS= 138, A1c= 6.0.Marland Kitchen. rec> same med. ~  labs 5/11 (wt=142#) showed BS= 141, A1c= 5.8 ~  Labs 4/12 (wt=129#) showed BS= 100, A1c= 5.9.Marland KitchenMarland Kitchen rec to decr Metformin to 1/2 tab each AM.  HYPOTHYROIDISM (ICD-244.9) - on LEVOTHYROID 75 micrograms/d... clinically Euthyroid w/ good energy. ~  labs 1/08 showed TSH=1.84 on 75 micrograms/d... ~  labs 3/09 showed TSH= 5.05 ~  labs 6/10 showed TSH= 1.27 ~  labs 5/11 showed TSH= 0.64 ~  Labs 4/12 showed TSH= 1.61  ? of PRIMARY HYPERPARATHYROIDISM (  ICD-252.01) - Ca++ levels in the 10.6-11.0 range... PO4- levels in the 2.1-2.4 range... PTH levels slowly rising 40-65... therefore following clinically w/ serial labs due now. ~  labs 1/08 showed:  ca++ 11.0, PTH 42.4.Marland KitchenMarland KitchenMarland Kitchen ~  labs 3/09 showed Ca= 10.3, PO4= 3.2, PTH= 50 ~  labs 6/10 showed Ca= 10.9, PO4= 3.7, PTH= 65 ~  labs 5/11 showed Ca= 10.5-10.9, Phos= 3.0, PTH= 111... rec> continue to follow closely. ~  Labs 4/12 showed Ca= 11.0, PO4= not done, PTH= 80... NOT progressive- rec no calc supplements, incr fluids.  GERD (ICD-530.81) - remote hx of PUD w/ GIB in the 80's... eval by  DrDBrodie in the past... prev on Zantac and Reglan but hasn't filled these since 2009... denies nausea, vomiting, heartburn, diarrhea, constipation, blood in stool, abdominal pain, swelling, gas... she is rec to use OTC Prilosec vs Zantac Prn & avoid Tums etc...  COLONIC POLYPS (ICD-211.3) - there is a pos family history of colon cancer (in brother)... last colonoscopy was 5/98 [facility]) showing diminutive polyp (tubular adenoma)... f/u planned 3-5 yrs, but she never went... she is overdue and needs to sched this important procedure but she refuses GI referral...  RENAL INSUFFICIENCY (ICD-588.9)  ~  labs 1/08 showed BUN= 21, Creat= 1.2 ~  labs 3/09 showed BUN= 32, Creat= 1.8 ~  labs 6/10 showed BUN= 46, Creat= 2.2.Marland KitchenMarland Kitchen rec to stop Maxzide (on 1/2 daily) ~  labs 7/10 showed BUN= 58, Creat= 1.6.Marland Kitchen. rec> incr fluid intake. ~  labs 5/11 showed BUN= 28, Creat= 1.5 ~  Labs 4/12 showed BUN=25, Creat=1.4  HEADACHE (ICD-784.0)  DEGENERATIVE JOINT DISEASE (ICD-715.90) - mod DJD and walks w/ crutches "because of my ankles"... she's seen [facility] in the past... started on low dose Pred 6/10 for this & Asthma> improved & she weaned off the Pred on her own... ~  6/10: CC= "my feet" c/o arthritic pain in feet & ankles... rec> trial Pred 10mg /d w/ ROV 82mo. ~  7/10: pt states improved on the Pred which has been weaned...  OSTEOPOROSIS (ICD-733.00) - currently taking ALENDRONATE 70mg /wk, no calcium supplement due to borderline hypercalcemia, MVI + Vit D 1000 u OTC supplement. ~  BMD 3/09 here showed TScores -2.9 in Spine, and -2.9 in right FemNeck... started on Alendronate 70mg /wk. ~  Vit D level 3/09 = 34... rec to start Vit D 1000 u OTC supplement. ~  Vit D level 6/10 = 50 ~  Vit D level 5/11 = 61 ~  5/12:  She needs f/u BMD ==> pending  ANXIETY (ICD-300.00)  HEALTH MAINTENANCE: ~  GYN: she hasn't seen GYN (DrCollins) in yrs and needs PAP, Mammogram, etc (she has been reminded to set this up w/  each subseq visit here but declines to do so; asked to let us set this up for her but she declines our assistance). ~  GI:  last colonoscopy 1998 at [facility] by [facility]- sm adenoma removed; advised f/u w/ each subseq visit here but she declines GI follow up; referral made to [facility] for this important f/u procedure but she never went. ~  Immunizations:  had Pneumovax 2001... Tetanus (TDAP) given 5/11... she refuses the seasonal Flu vaccines & is reminded of the importance of these yearly vaccinations.   No past surgical history on file.   Outpatient Encounter Prescriptions as of 01/24/2011  Medication Sig Dispense Refill  . albuterol (PROAIR HFA) 108 (90 BASE) MCG/ACT inhaler Inhale 2 puffs into the lungs every 6 (six) hours as needed.        Marland Kitchen  alendronate (FOSAMAX) 70 MG tablet Take 70 mg by mouth every 7 (seven) days. Take with a full glass of water on an empty stomach.       Marland Kitchen aspirin 81 MG tablet Take 81 mg by mouth daily.        . Cholecalciferol (VITAMIN D) 1000 UNITS capsule Take 1,000 Units by mouth daily.        . fluticasone (FLOVENT HFA) 220 MCG/ACT inhaler Inhale 1 puff into the lungs 2 (two) times daily.        Marland Kitchen LEVOTHROID 75 MCG tablet TAKE 1 TABLET BY MOUTH ONCE DAILY  30 tablet  3  . lisinopril (PRINIVIL,ZESTRIL) 10 MG tablet TAKE 1 TABLET BY MOUTH ONCE DAILY  30 tablet  3  . metFORMIN (GLUCOPHAGE-XR) 500 MG 24 hr tablet TAKE 1 TABLET BY MOUTH ONCE DAILY ==> DECR to 1/2 tab Qam.  3  . THEOPHYLLINE 300 MG 12 hr tablet TAKE 1 TABLET BY MOUTH DAILY  30 tablet  3  . DISCONTD: predniSONE (DELTASONE) 10 MG tablet          Allergies  Allergen Reactions  . Cephalexin     REACTION: rash, itching  . Codeine     REACTION: rash, itching  . LKG:[id number]+ZDGLOVFIEP Acid+Aspartame     REACTION: rash, itching    Review of Systems         See HPI - all other systems neg except as noted... The patient complains of decreased hearing, dyspnea on exertion, and difficulty  walking.  The patient denies anorexia, fever, weight loss, weight gain, vision loss, hoarseness, chest pain, syncope, peripheral edema, prolonged cough, headaches, hemoptysis, abdominal pain, melena, hematochezia, severe indigestion/heartburn, hematuria, incontinence, muscle weakness, suspicious skin lesions, transient blindness, depression, unusual weight change, abnormal bleeding, enlarged lymph nodes, and angioedema.     Objective:   Physical Exam     WD, WN, [name] in NAD... GENERAL:  Alert & oriented; pleasant & cooperative... HEENT:  [place]/AT, EOM-wnl, PERRLA, EACs-clear, TMs-wnl, NOSE-clear, THROAT-clear & wnl. NECK:  Supple w/ fairROM; no JVD; normal carotid impulses w/ 1+ bilat bruits; no thyromegaly or nodules palpated; no lymphadenopathy. CHEST:  decr BS bilat, clear to P & A; without wheezes or rales; + scat rhonchi heard...  HEART:  Regular Rhythm; +gr1-2 sys murmur, no rubs or gallops detected... ABDOMEN:  Soft & nontender; normal bowel sounds; no organomegaly or masses palpated... EXT: without deformities, mild arthritic changes; no varicose veins/ venous insuffic/ or edema. +gait abn using 2 canes, stooped over, etc... NEURO:  CN's intact;  no focal neuro deficits... DERM:  No lesions noted; no rash etc...   Assessment & Plan:   ASTHMA>  She has a component of fixed airflow obstruction from her yrs of RADS; continue Proair, Flovent, TheoDur, & add MUCINEX/ Fluids...  HBP>  Controlled on ACE & tol well, continue same...  Abn EKG/ CBruit>  She never went for prev 2DEcho/ CDopplers & seems oblivious to recommendations; discussed w/ pt> stable on ASA 81mg /d & not interested in aggressive eval or treatments, asked to consider NCB/ living will & she will discuss w/ relatives (here by herself today); asked to bring family to follow up visits please...  DM>  She has lost 13# to 129# today, states she is OK, not checking BS at home (100 & 5.9 today), discussed diet, & REC> decr  Metformin 500mg  to 1/2 tab each AM...  Hypothy>  Stable on Synth19mcg dose & TSH= 1.61  Hyperpara>  Ca up  to 11 and PTH= 80 & not progressive, therefore continue to watch carefully & REC no calc supplements etc...  Renal insuffic>  Creat stable & 1.4 today...  DJD/ Osteoporosis>  Mod severe DJD & copes well, due for f/u BMD on the Fosamax ==> pending.  Other medical issues as noted.Marland KitchenMarland Kitchen

## 2011-01-25 ENCOUNTER — Other Ambulatory Visit: Payer: Self-pay | Admitting: Pulmonary Disease

## 2011-01-25 ENCOUNTER — Encounter: Payer: Self-pay | Admitting: Pulmonary Disease

## 2011-01-25 DIAGNOSIS — R0989 Other specified symptoms and signs involving the circulatory and respiratory systems: Secondary | ICD-10-CM

## 2011-01-26 ENCOUNTER — Ambulatory Visit (INDEPENDENT_AMBULATORY_CARE_PROVIDER_SITE_OTHER): Payer: Medicare Other | Admitting: Cardiology

## 2011-01-26 DIAGNOSIS — R0989 Other specified symptoms and signs involving the circulatory and respiratory systems: Secondary | ICD-10-CM

## 2011-01-26 DIAGNOSIS — I6529 Occlusion and stenosis of unspecified carotid artery: Secondary | ICD-10-CM

## 2011-01-28 ENCOUNTER — Telehealth: Payer: Self-pay | Admitting: Pulmonary Disease

## 2011-01-28 NOTE — Telephone Encounter (Signed)
Spoke with pt's son and he wants to know why the metformin is being decreased.  I advised looks like  that since her HBG A1C looks okay, it is okay for her to cut back on her med a little.  He states that he just wants to verify with SN that this is correct. Pls advise thanks

## 2011-01-28 NOTE — Telephone Encounter (Signed)
The decrease in the metformin was due to her a1c was at 5.9.  Per SN and the message from [facility] that SN recorded --SN does want pt to cut metformin to 1/2 tablet every morning. thanks

## 2011-01-28 NOTE — Telephone Encounter (Signed)
Spoke w/ Ashley Norman and advised him per SN pt was to cut metformin down to 1/2 tab q morning. He verbalized understanding and had no questions

## 2011-02-01 ENCOUNTER — Encounter: Payer: Self-pay | Admitting: Pulmonary Disease

## 2011-02-11 ENCOUNTER — Other Ambulatory Visit: Payer: Self-pay | Admitting: Pulmonary Disease

## 2011-03-11 ENCOUNTER — Other Ambulatory Visit: Payer: Self-pay | Admitting: Pulmonary Disease

## 2011-03-14 ENCOUNTER — Encounter: Payer: Self-pay | Admitting: Adult Health

## 2011-03-23 ENCOUNTER — Telehealth: Payer: Self-pay | Admitting: Pulmonary Disease

## 2011-03-23 NOTE — Telephone Encounter (Signed)
Spoke with pt's daughter. She states that she is concerned about her mother's loss of memory, seems to be getting worse and now pt is not taking her meds regularly. She also has loss of appetite. OV with TP sched for 03/24/11 at 10:45 am.

## 2011-03-23 NOTE — Telephone Encounter (Signed)
[email or web address].Swade Shonka, CMA  

## 2011-03-25 ENCOUNTER — Other Ambulatory Visit (INDEPENDENT_AMBULATORY_CARE_PROVIDER_SITE_OTHER): Payer: Medicare Other

## 2011-03-25 ENCOUNTER — Encounter: Payer: Self-pay | Admitting: Adult Health

## 2011-03-25 ENCOUNTER — Ambulatory Visit (INDEPENDENT_AMBULATORY_CARE_PROVIDER_SITE_OTHER): Payer: Medicare Other | Admitting: Adult Health

## 2011-03-25 DIAGNOSIS — R300 Dysuria: Secondary | ICD-10-CM

## 2011-03-25 DIAGNOSIS — E119 Type 2 diabetes mellitus without complications: Secondary | ICD-10-CM

## 2011-03-25 DIAGNOSIS — D126 Benign neoplasm of colon, unspecified: Secondary | ICD-10-CM

## 2011-03-25 DIAGNOSIS — R531 Weakness: Secondary | ICD-10-CM

## 2011-03-25 DIAGNOSIS — R5381 Other malaise: Secondary | ICD-10-CM

## 2011-03-25 LAB — CBC WITH DIFFERENTIAL/PLATELET
Basophils Absolute: 0 10*3/uL (ref 0.0–0.1)
Eosinophils Absolute: 0.2 10*3/uL (ref 0.0–0.7)
HCT: 42.8 % (ref 36.0–46.0)
Hemoglobin: 14.4 g/dL (ref 12.0–15.0)
Lymphs Abs: 2 10*3/uL (ref 0.7–4.0)
MCHC: 33.7 g/dL (ref 30.0–36.0)
Monocytes Absolute: 0.7 10*3/uL (ref 0.1–1.0)
Neutro Abs: 6.7 10*3/uL (ref 1.4–7.7)
Platelets: 223 10*3/uL (ref 150.0–400.0)
RDW: 15.1 % — ABNORMAL HIGH (ref 11.5–14.6)

## 2011-03-25 LAB — URINALYSIS, ROUTINE W REFLEX MICROSCOPIC
Ketones, ur: 15
Specific Gravity, Urine: >=1.03 (ref 1.000–1.030)
Urine Glucose: NEGATIVE
Urobilinogen, UA: 1 (ref 0.0–1.0)
pH: 5.5 (ref 5.0–8.0)

## 2011-03-25 LAB — BASIC METABOLIC PANEL
CO2: 27 mEq/L (ref 19–32)
Calcium: 10.4 mg/dL (ref 8.4–10.5)
Creatinine, Ser: 1.4 mg/dL — ABNORMAL HIGH (ref 0.4–1.2)
GFR: 38.06 mL/min — ABNORMAL LOW (ref >60.00)

## 2011-03-25 NOTE — Progress Notes (Signed)
Subjective:    Patient ID: Ashley Norman, female    DOB: [birth year], 77 y.o.   MRN: [id number]  HPI  [name] with known hx of HTN, Asthma, DM, and DJD  ~  Jan 27, 2010:  she never ret for f/u visit & weaned the Pred on her own to 1/2 tab=5mg  Qod... she notes breathing has remained good & arthritis improved- "all except my feet" & still walks w/ canes (offered eval by Princess Perna & she will think about it)...  she reports Asthma stable on meds;  BP controlled on Lisinopril w/o side effects;  remains on Metformin daily & not checking BS at home;  due for f/u thyroid & parathyroid labs, etc <SEE BELOW> she refuses yearly Flu vaccines but will accept a TDAP today.  ~  January 24, 2011:  Yearly ROV "just getting older" she says;  She weaned off her Pred & hasn't taken this in months, breathing fair- some congestion, wheezing, cough... BP controlled;  Denies CP, plapit, cerebral ischemic symptoms;  She never did the CDopplers as requested last yr "I'm getting forgetful" she says;  Not checking sugars at home (last a1c= 5.8 to 6.0);  Due for f/u thyroid & parathyroid blood work as well... We will sched the CDopplers and f/u BMD at her convenience...>>labs done.   ~March 25, 2011 Work in [employer] presents with daughter today due to family concerns. Pt is very adamant that she is fine and does not need anything "this is a waste of time". After long discussion with daughter and pt it appears there is some famiily stress in her living situation. Over last week she has been eating less, did not want to take her meds. Family is concerned she is depressed. It appears this is new and was not present few weeks ago. Several family members live with her in her home including her other daughters kid that are 8 and 40  Along with her son.  Having some trouble with the kids -she is helping raise them. They deny any violence or possible abuse.  No confusion . No urinary symptoms or chest pain.  Says she just did not want to  take her meds but will take them when she gets home "if I let her get out out of  here".            Problem List:    ASTHMA (ICD-493.90) - she is a non-smoker... takes TheoDur 300mg /d, PROAIR HFA 2spBid &Prn, FLOVENT 220 2spBid, & now off Pred... ~  baseline CXR w/ hyperinflation, tort Ao, NAD... f/u film 3/09 showed COPD, NAD.Marland Kitchen. ~  CXR 5/11 showed enlarged heart, ectatic Ao, hyperinflation, NAD... ~  4/12:  Notes some cough, congestion, intermittent wheezing> on Theo/ Proair/ Flovent;  Rec- Mucinex/ Fluids;  CXR at baseline- COPD, NAD...  HYPERTENSION (ICD-401.9) - on LISINOPRIL 10mg /d... ~  cardiac cath 1991 was neg- norm coronaries (she may have had spasm). ~  labs 3/09 showed K=3.8, BUN=32, Creat=1.8 ~  labs 6/10 showed K=4.4, BUN=46, Creat=2.2.Marland Kitchen. rec> stop Maxzide & KCl, ROV 75mo. ~  labs 7/10 showed K=4.6, BUN=58, Creat=1.6.Marland Kitchen. rec> incr fluid intake, stay off Maxzide. ~  5/11:  BP=138/72, tol Rx well, no problems; denies HA, visual changes, CP, palipit, syncope, ch in dyspnea, etc; notes tr edema intermittently. ~  4/12:  BP= 138/84 & she remains essentially asymptomatic; K=3.9, BUN=25, Creat=1.4  ABNORMAL ELECTROCARDIOGRAM (ICD-794.31) - EKG shows NSR, IVCD, LAD, poor R progression... ~  2DEcho 5/11 ==>  ordered but pt never returned to have this done...  CAROTID BRUIT (ICD-785.9) - on ASA 81mg /d... she denies cerebral ischemic symptoms... ~  CDopplers 5/11 ==> ordered but pt never returned to have this done...  DIABETES MELLITUS (ICD-250.00) - on METFORMIN ER 500mg /d... tol well, no hypogly reactions etc... ~  labs 1/08 showed BS=172, HgA1c=6.5 & Metformin 500mg /d started... ~  labs 3/09 showed BS= 99, A1c= 5.7 ~  labs 6/10 showed BS= 138, A1c= 6.0.Marland Kitchen. rec> same med. ~  labs 5/11 (wt=142#) showed BS= 141, A1c= 5.8 ~  Labs 4/12 (wt=129#) showed BS= 100, A1c= 5.9.Marland KitchenMarland Kitchen rec to decr Metformin to 1/2 tab each AM.  HYPOTHYROIDISM (ICD-244.9) - on LEVOTHYROID 75 micrograms/d... clinically  Euthyroid w/ good energy. ~  labs 1/08 showed TSH=1.84 on 75 micrograms/d... ~  labs 3/09 showed TSH= 5.05 ~  labs 6/10 showed TSH= 1.27 ~  labs 5/11 showed TSH= 0.64 ~  Labs 4/12 showed TSH= 1.61  ? of PRIMARY HYPERPARATHYROIDISM (ICD-252.01) - Ca++ levels in the 10.6-11.0 range... PO4- levels in the 2.1-2.4 range... PTH levels slowly rising 40-65... therefore following clinically w/ serial labs due now. ~  labs 1/08 showed:  ca++ 11.0, PTH 42.4.Marland KitchenMarland KitchenMarland Kitchen ~  labs 3/09 showed Ca= 10.3, PO4= 3.2, PTH= 50 ~  labs 6/10 showed Ca= 10.9, PO4= 3.7, PTH= 65 ~  labs 5/11 showed Ca= 10.5-10.9, Phos= 3.0, PTH= 111... rec> continue to follow closely. ~  Labs 4/12 showed Ca= 11.0, PO4= not done, PTH= 80... NOT progressive- rec no calc supplements, incr fluids.  GERD (ICD-530.81) - remote hx of PUD w/ GIB in the 80's... eval by DrDBrodie in the past... prev on Zantac and Reglan but hasn't filled these since 2009...    COLONIC POLYPS (ICD-211.3) - there is a pos family history of colon cancer (in brother)... last colonoscopy was 5/98 [facility]) showing diminutive polyp (tubular adenoma)... f/u planned 3-5 yrs, but she never went... she is overdue and needs to sched this important procedure but she refuses GI referral...  RENAL INSUFFICIENCY (ICD-588.9)  ~  labs 1/08 showed BUN= 21, Creat= 1.2 ~  labs 3/09 showed BUN= 32, Creat= 1.8 ~  labs 6/10 showed BUN= 46, Creat= 2.2.Marland KitchenMarland Kitchen rec to stop Maxzide (on 1/2 daily) ~  labs 7/10 showed BUN= 58, Creat= 1.6.Marland Kitchen. rec> incr fluid intake. ~  labs 5/11 showed BUN= 28, Creat= 1.5 ~  Labs 4/12 showed BUN=25, Creat=1.4  HEADACHE (ICD-784.0)  DEGENERATIVE JOINT DISEASE (ICD-715.90) - mod DJD and walks w/ crutches "because of my ankles"... she's seen [facility] in the past... started on low dose Pred 6/10 for this & Asthma> improved & she weaned off the Pred on her own... ~  6/10: CC= "my feet" c/o arthritic pain in feet & ankles... rec> trial Pred 10mg /d w/ ROV  41mo. ~  7/10: pt states improved on the Pred which has been weaned...  OSTEOPOROSIS (ICD-733.00) - currently taking ALENDRONATE 70mg /wk, no calcium supplement due to borderline hypercalcemia, MVI + Vit D 1000 u OTC supplement. ~  BMD 3/09 here showed TScores -2.9 in Spine, and -2.9 in right FemNeck... started on Alendronate 70mg /wk. ~  Vit D level 3/09 = 34... rec to start Vit D 1000 u OTC supplement. ~  Vit D level 6/10 = 50 ~  Vit D level 5/11 = 61 ~  5/12:  She needs f/u BMD ==> pending  ANXIETY (ICD-300.00)  HEALTH MAINTENANCE: ~  GYN: she hasn't seen GYN (DrCollins) in yrs and needs PAP, Mammogram, etc (she has  been reminded to set this up w/ each subseq visit here but declines to do so; asked to let us set this up for her but she declines our assistance). ~  GI:  last colonoscopy 1998 at [facility] by [facility]- sm adenoma removed; advised f/u w/ each subseq visit here but she declines GI follow up; referral made to [facility] for this important f/u procedure but she never went. ~  Immunizations:  had Pneumovax 2001... Tetanus (TDAP) given 5/11... she refuses the seasonal Flu vaccines & is reminded of the importance of these yearly vaccinations.   Past Surgical History  Procedure Date  . Tonsillectomy and adenoidectomy   . Appendectomy      Outpatient Encounter Prescriptions as of 01/24/2011  Medication Sig Dispense Refill  . albuterol (PROAIR HFA) 108 (90 BASE) MCG/ACT inhaler Inhale 2 puffs into the lungs every 6 (six) hours as needed.        Marland Kitchen alendronate (FOSAMAX) 70 MG tablet Take 70 mg by mouth every 7 (seven) days. Take with a full glass of water on an empty stomach.       Marland Kitchen aspirin 81 MG tablet Take 81 mg by mouth daily.        . Cholecalciferol (VITAMIN D) 1000 UNITS capsule Take 1,000 Units by mouth daily.        . fluticasone (FLOVENT HFA) 220 MCG/ACT inhaler Inhale 1 puff into the lungs 2 (two) times daily.        Marland Kitchen LEVOTHROID 75 MCG tablet TAKE 1 TABLET BY MOUTH ONCE DAILY  30  tablet  3  . lisinopril (PRINIVIL,ZESTRIL) 10 MG tablet TAKE 1 TABLET BY MOUTH ONCE DAILY  30 tablet  3  . metFORMIN (GLUCOPHAGE-XR) 500 MG 24 hr tablet TAKE 1 TABLET BY MOUTH ONCE DAILY ==> DECR to 1/2 tab Qam.  3  . THEOPHYLLINE 300 MG 12 hr tablet TAKE 1 TABLET BY MOUTH DAILY  30 tablet  3  . DISCONTD: predniSONE (DELTASONE) 10 MG tablet          Allergies  Allergen Reactions  . Cephalexin     REACTION: rash, itching  . Codeine     REACTION: rash, itching  . JYN:[id number]+VHQIONGEX+[id number] Acid+Aspartame     REACTION: rash, itching    Review of Systems Constitutional:   No  weight loss, night sweats,  Fevers, chills, + fatigue, or  lassitude.  HEENT:   No headaches,  Difficulty swallowing,  Tooth/dental problems, or  Sore throat,                No sneezing, itching, ear ache, nasal congestion, post nasal drip,   CV:  No chest pain,  Orthopnea, PND, swelling in lower extremities, anasarca, dizziness, palpitations, syncope.   GI  No heartburn, indigestion, abdominal pain, nausea, vomiting, diarrhea, change in bowel habits,  +loss of appetite  Resp: No shortness of breath with exertion or at rest.  No excess mucus, no productive cough,  No non-productive cough,  No coughing up of blood.  No change in color of mucus.  No wheezing.  No chest wall deformity  Skin: no rash or lesions.  GU: no dysuria, change in color of urine, no urgency or frequency.  No flank pain, no hematuria   MS:  No joint pain or swelling.  No decreased range of motion.     Psych:  No change in mood or affect. No depression or anxiety.  Objective:   Physical Exam      WD, WN, [name] in NAD... GENERAL:  Alert & oriented; pleasant & cooperative... HEENT:  [place]/AT, EOM-wnl, PERRLA, EACs-clear, TMs-wnl, NOSE-clear, THROAT-clear & wnl. NECK:  Supple w/ fairROM; no JVD; normal carotid impulses w/ 1+ bilat bruits; no thyromegaly or nodules palpated; no lymphadenopathy. CHEST:   decr BS bilat, clear to P & A; without wheezes or rales; + scat rhonchi heard...  HEART:  Regular Rhythm; +gr1-2 sys murmur, no rubs or gallops detected... ABDOMEN:  Soft & nontender; normal bowel sounds; no organomegaly or masses palpated... EXT: without deformities, mild arthritic changes; no varicose veins/ venous insuffic/ or edema. +gait abn using 2 canes, stooped over, etc... NEURO:  CN's intact;  no focal neuro deficits...a/ox3,  DERM:  No lesions noted; no rash etc...   Assessment & Plan:

## 2011-03-25 NOTE — Assessment & Plan Note (Signed)
?   Weakness, anorexia and mood disturbance  Will check her labs looking for explanation along with possible UTI  Daughter is planning to taker her to her home for few days to see if this helps  Advised if symptoms persist may need to try antidepressants to help

## 2011-03-25 NOTE — Patient Instructions (Signed)
Need to restart meds as directed.  Eat small frequent meals. -softer diet  I will call with lab results.  If not improving will need to be seen back in office sooner.  Please contact office for sooner follow up if symptoms do not improve or worsen or seek emergency care  follow up Dr. Kriste Basque  In 4-6 weeks and As needed

## 2011-03-26 LAB — URINE CULTURE: Colony Count: 35000

## 2011-03-28 ENCOUNTER — Telehealth: Payer: Self-pay | Admitting: Pulmonary Disease

## 2011-03-28 DIAGNOSIS — R1310 Dysphagia, unspecified: Secondary | ICD-10-CM

## 2011-03-28 NOTE — Telephone Encounter (Signed)
Referral to GI placed per TP's request.  Will close message as pt's daughter is aware of the referral.

## 2011-03-28 NOTE — Telephone Encounter (Signed)
Called spoke with patient's daughter, Lupita Leash, advised of lab results / recs as stated by TP.  Lupita Leash verbalized her understanding but reported that pt has not improved since ov > has not been taking meds and feels that she is eating less than she was at OV, stating that "it hurts when she swallows."  Will for to TP for recs.

## 2011-03-28 NOTE — Telephone Encounter (Signed)
Spoke with daughter pt continues to have decreased appetite , poor intake.  Now feels trouble swallowing , still very low intake.  Have recommended that if this does not improve will need admission for fluid rehydration We will refer to GI ASAP for trouble swallowing , poor intake, previous pt of Dr. Juanda Chance.  Please contact office for sooner follow up if symptoms do not improve or worsen or seek emergency care

## 2011-03-29 ENCOUNTER — Telehealth: Payer: Self-pay | Admitting: Internal Medicine

## 2011-03-29 NOTE — Telephone Encounter (Signed)
Patient scheduled on 03/31/11 at 9:30 AM with Mike Gip, PA(Dr. Juanda Chance supervising) Almyra Free to notify patient.

## 2011-03-31 ENCOUNTER — Ambulatory Visit (INDEPENDENT_AMBULATORY_CARE_PROVIDER_SITE_OTHER): Payer: Medicare Other | Admitting: Internal Medicine

## 2011-03-31 ENCOUNTER — Ambulatory Visit: Payer: Medicare Other | Admitting: Physician Assistant

## 2011-03-31 ENCOUNTER — Encounter: Payer: Self-pay | Admitting: Internal Medicine

## 2011-03-31 VITALS — BP 124/80 | HR 58 | Ht 67.0 in | Wt 119.0 lb

## 2011-03-31 DIAGNOSIS — R634 Abnormal weight loss: Secondary | ICD-10-CM

## 2011-03-31 DIAGNOSIS — R1319 Other dysphagia: Secondary | ICD-10-CM

## 2011-03-31 DIAGNOSIS — R1310 Dysphagia, unspecified: Secondary | ICD-10-CM

## 2011-03-31 MED ORDER — MEGESTROL ACETATE 40 MG/ML PO SUSP: 200.0000 mg | Freq: Every day | ORAL | Status: DC

## 2011-03-31 NOTE — Patient Instructions (Addendum)
You have been scheduled for an endoscopy with dilation. Please follow written instructions given to you at your visit today. We have sent the following medications to your pharmacy for you to pick up at your convenience: Megace- Take 5 ml by mouth once daily. Dr Jodelle Green

## 2011-03-31 NOTE — Progress Notes (Signed)
Ashley Norman 04-Apr-1928 MRN [id number]      History of Present Illness:  This is an 77 year old white female brought by her family with complaints of failure to thrive, weight loss and difficulty swallowing. She has been having pill dysphagia as well as dysphagia to liquids and solids. She has a cough when she lays down at night. She lives with her son most of the time and this week with her daughter. She admits to having no appetite and not being interested in eating at all. Her weight in 2006 was 144 pounds. Last weight was 120 pounds, and today her weight is118 pounds. There has been no vomiting or diarrhea. She has never had a GI evaluation. Her colonoscopy in 1998 for family history of colon cancer in her brother showed a small polyp which was a tubular adenoma. She never returned for a repeat colonoscopy. She has had depression. She multiple medical problems which include hypertension, diabetes, renal insufficiency and anxiety. She denies abdominal pain. She has been on Fosamax 70 mg weekly.   Past Medical History  Diagnosis Date  . Asthma   . Hypertension   . Abnormal electrocardiogram   . Carotid bruit   . Diabetes mellitus   . Hypothyroidism   . Primary hyperparathyroidism   . GERD (gastroesophageal reflux disease)   . Hx of colonic polyps   . Renal insufficiency   . Headache   . DJD (degenerative joint disease)   . Osteoporosis   . Anxiety    Past Surgical History  Procedure Date  . Tonsillectomy and adenoidectomy   . Appendectomy     reports that she has never smoked. She has never used smokeless tobacco. She reports that she does not drink alcohol or use illicit drugs. family history includes Cerebral aneurysm in her mother; Colon cancer in her brother; Heart failure in her father and sister; Leukemia in her brother; Lung cancer in her brother; and Stomach cancer in her brother. Allergies  Allergen Reactions  . Cephalexin     REACTION: rash, itching  . Codeine       REACTION: rash, itching  . VOZ:[id number] Acid+Aspartame     REACTION: rash, itching        Review of Systems: Positive for dysphagia to pills solids and liquids denies chest pain change in bowel habits or abdominal pain  The remainder of the 10  point ROS is negative except as outlined in H&P   Physical Exam: General appearance depressed appearing, chronically ill, small female, responds appropriately. Eyes- non icteric. HEENT nontraumatic, normocephalic. Mouth no lesions, tongue papillated, no cheilosis, edentulous. Neck supple without adenopathy, thyroid not enlarged, no carotid bruits, no JVD. Lungs Clear to auscultation bilaterally, thoracic kyphosis.  Cor normal S1 normal S2, regular rhythm , no murmur,  quiet precordium. Abdomen Soft, nontender abdomen, normal active bowel sounds. No distention. Liver edge at costal margin.  Rectal:Not done. Extremities no pedal edema. Skin no lesions. Neurological alert and oriented x 3. Psychological depressed mood and affect.  Assessment and Plan:  Problem #1 Gradual weight loss resulting from several problems; mainly depression but also difficulty in swallowing. I suspect esophageal dysmotility but cannot rule out an esophageal stricture. This needs to be further evaluated. I have spoken with patient's daughter and granddaughter who brought her. We will start patient on Megace 200 mg daily and schedule her for an upper endoscopy with possible dilation. She at some point may need a speech pathology evaluation to rule out LPR or aspiration.  She will hold Fosamax for now because it is contraindicated in esophageal disorders. We may need to consider an antidepressant.  Problem #2 Family history of colon cancer in her brother. Her last colonoscopy was in 1998. At some point when she is able to swallow, we may consider a colonoscopy although it may not be necessary at her age of 66.     03/31/2011 [name]

## 2011-04-15 ENCOUNTER — Ambulatory Visit (HOSPITAL_COMMUNITY)
Admission: RE | Admit: 2011-04-15 | Discharge: 2011-04-15 | Disposition: A | Discharge status: Home / Self Care | Payer: Medicare Other | Source: Ambulatory Visit | Attending: Internal Medicine | Admitting: Internal Medicine

## 2011-04-15 ENCOUNTER — Encounter: Payer: Medicare Other | Admitting: Internal Medicine

## 2011-04-15 DIAGNOSIS — R1310 Dysphagia, unspecified: Secondary | ICD-10-CM | POA: Insufficient documentation

## 2011-04-15 DIAGNOSIS — R627 Adult failure to thrive: Secondary | ICD-10-CM | POA: Insufficient documentation

## 2011-04-15 DIAGNOSIS — R1319 Other dysphagia: Secondary | ICD-10-CM

## 2011-04-19 ENCOUNTER — Other Ambulatory Visit: Payer: Self-pay | Admitting: Pulmonary Disease

## 2011-04-21 ENCOUNTER — Other Ambulatory Visit: Payer: Self-pay | Admitting: Pulmonary Disease

## 2011-04-21 ENCOUNTER — Encounter: Payer: Self-pay | Admitting: Pulmonary Disease

## 2011-04-21 ENCOUNTER — Telehealth: Payer: Self-pay | Admitting: Pulmonary Disease

## 2011-04-21 ENCOUNTER — Ambulatory Visit (INDEPENDENT_AMBULATORY_CARE_PROVIDER_SITE_OTHER): Payer: Medicare Other | Admitting: Pulmonary Disease

## 2011-04-21 ENCOUNTER — Other Ambulatory Visit (INDEPENDENT_AMBULATORY_CARE_PROVIDER_SITE_OTHER): Payer: Medicare Other

## 2011-04-21 DIAGNOSIS — E538 Deficiency of other specified B group vitamins: Secondary | ICD-10-CM

## 2011-04-21 DIAGNOSIS — F32A Depression, unspecified: Secondary | ICD-10-CM

## 2011-04-21 DIAGNOSIS — E119 Type 2 diabetes mellitus without complications: Secondary | ICD-10-CM

## 2011-04-21 DIAGNOSIS — R5381 Other malaise: Secondary | ICD-10-CM

## 2011-04-21 DIAGNOSIS — E213 Hyperparathyroidism, unspecified: Secondary | ICD-10-CM

## 2011-04-21 DIAGNOSIS — R627 Adult failure to thrive: Secondary | ICD-10-CM

## 2011-04-21 DIAGNOSIS — N259 Disorder resulting from impaired renal tubular function, unspecified: Secondary | ICD-10-CM

## 2011-04-21 DIAGNOSIS — F329 Major depressive disorder, single episode, unspecified: Secondary | ICD-10-CM | POA: Insufficient documentation

## 2011-04-21 DIAGNOSIS — I10 Essential (primary) hypertension: Secondary | ICD-10-CM

## 2011-04-21 DIAGNOSIS — R531 Weakness: Secondary | ICD-10-CM

## 2011-04-21 DIAGNOSIS — K219 Gastro-esophageal reflux disease without esophagitis: Secondary | ICD-10-CM

## 2011-04-21 DIAGNOSIS — D126 Benign neoplasm of colon, unspecified: Secondary | ICD-10-CM

## 2011-04-21 DIAGNOSIS — M810 Age-related osteoporosis without current pathological fracture: Secondary | ICD-10-CM

## 2011-04-21 DIAGNOSIS — M1990 Unspecified osteoarthritis, unspecified site: Secondary | ICD-10-CM

## 2011-04-21 DIAGNOSIS — E039 Hypothyroidism, unspecified: Secondary | ICD-10-CM

## 2011-04-21 DIAGNOSIS — F3289 Other specified depressive episodes: Secondary | ICD-10-CM

## 2011-04-21 DIAGNOSIS — J45909 Unspecified asthma, uncomplicated: Secondary | ICD-10-CM

## 2011-04-21 LAB — HEPATIC FUNCTION PANEL
AST: 19 U/L (ref 0–37)
Bilirubin, Direct: 0.1 mg/dL (ref 0.0–0.3)
Total Bilirubin: 1 mg/dL (ref 0.3–1.2)

## 2011-04-21 LAB — TSH: TSH: 5.4 u[IU]/mL (ref 0.35–5.50)

## 2011-04-21 LAB — CBC WITH DIFFERENTIAL/PLATELET
Basophils Relative: 0.4 % (ref 0.0–3.0)
Eosinophils Relative: 1.6 % (ref 0.0–5.0)
HCT: 39.2 % (ref 36.0–46.0)
Lymphs Abs: 2.2 10*3/uL (ref 0.7–4.0)
MCHC: 33.4 g/dL (ref 30.0–36.0)
MCV: 84.2 fl (ref 78.0–100.0)
Monocytes Absolute: 0.6 10*3/uL (ref 0.1–1.0)
Platelets: 297 10*3/uL (ref 150.0–400.0)
WBC: 10.5 10*3/uL (ref 4.5–10.5)

## 2011-04-21 LAB — HEMOGLOBIN A1C: Hgb A1c MFr Bld: 6.2 % (ref 4.6–6.5)

## 2011-04-21 LAB — BASIC METABOLIC PANEL
BUN: 23 mg/dL (ref 6–23)
GFR: 42.55 mL/min — ABNORMAL LOW (ref >60.00)
Potassium: 3.3 mEq/L — ABNORMAL LOW (ref 3.5–5.1)
Sodium: 141 mEq/L (ref 135–145)

## 2011-04-21 LAB — VITAMIN B12: Vitamin B-12: 1373 pg/mL — ABNORMAL HIGH (ref 211–911)

## 2011-04-21 MED ORDER — SERTRALINE HCL 25 MG PO TABS: 25.0000 mg | ORAL_TABLET | Freq: Every day | ORAL | Status: DC

## 2011-04-21 NOTE — Telephone Encounter (Signed)
Per SN---he will be more than happy to see the pt and discuss issues with her and her family.  Called and spoke with donna --pts daughter and she will bring pt on over to the office for ov.

## 2011-04-21 NOTE — Progress Notes (Signed)
Subjective:    Patient ID: Ashley Norman, female    DOB: May 05, 1928, 77 y.o.   MRN: [id number]  HPI [name] here for a follow up visit... she has mult med problems as listed below...   ~  Jan 27, 2010:  she never ret for f/u visit & weaned the Pred on her own to 1/2 tab=5mg  Qod... she notes breathing has remained good & arthritis improved- "all except my feet" & still walks w/ canes (offered eval by Princess Perna & she will think about it)...  she reports Asthma stable on meds;  BP controlled on Lisinopril w/o side effects;  remains on Metformin daily & not checking BS at home;  due for f/u thyroid & parathyroid labs, etc <SEE BELOW> she refuses yearly Flu vaccines but will accept a TDAP today.  ~  January 24, 2011:  Yearly ROV "just getting older" she says;  She weaned off her Pred & hasn't taken this in months, breathing fair- some congestion, wheezing, cough... BP controlled;  Denies CP, plapit, cerebral ischemic symptoms;  She never did the CDopplers as requested last yr "I'm getting forgetful" she says;  Not checking sugars at home (last A1c= 5.8 to 6.0);  Due for f/u thyroid & parathyroid blood work as well... We will sched the CDopplers (neg w/ 0-39% bilat ICAstenoses) and f/u BMD (TScores -2.8 Spine, & -2.7 FemNecks) at her convenience...  ~  April 21, 2011:  75mo ROV & add on at family request> She has hx of Asthma/COPD, HBP, DM, Hypothyroid, Hyperparathyroid, Renal Insuffic, DJD, Osteoporosis> they relate a bizarre story going back over 4-6wks of confusion, very stubborn, refusing to take meds/ eat much/ or drink fluids; sounding depressed & some of her conversation w/ daughter had vailed suicide threats; family didn't seek med attention/ ER/ Psyche & tried to encourage her, changed her venue, & accommodate her in any way they could> but all to no avail...  They present today after an ordeal getting her into the office (took 1-2H to get her dressed & into the car, then wouldn't get out at the  office); all [name] can say is "can I leave yet", her biggest concern being that we will put her in the hosp which she refuses;  Exam reveals VS- ok, Chest- clear, gr 1/6 SEM, thin abd- nontender, diffusely weak, not making good sense, delay relaxation phase of DTRs;  They are conflicted & not sure which way to go> I suggested HOSP for full eval & Psychiatric consultation, but they prefer to try outpt RX if poss> we discussed an agreement= she will take meds, eat some, & drink Glucerna & fluids; and we will check labs & try to keep her out of the hosp if poss...    Family brought her in to see DrDBrodie w/ adult FTT, wt loss & difficulty swallowing (?dysphagia for solids, pills, & liqs vs really just refusing to swallow)> Given Megace (pt wouldn't take it due to smell & taste), & did EGD> reported neg & dilatation performed...          Problem List:    ASTHMA (ICD-493.90) - she is a non-smoker> Prev on TheoDur 300mg /d, PROAIR HFA 2spBid &Prn, FLOVENT 220 2spBid (off prev Pred);  She stopped all of her meds on her own in June2012 (?why)... ~  baseline CXR w/ hyperinflation, tort Ao, NAD... ~  4/12:  Notes some cough, congestion, intermittent wheezing> on Theo/ Proair/ Flovent;  Rec- Mucinex/ Fluids... ~  CXR 4/12  showed ectatic Ao, hyperinflation c/w emphysema, clear/ NAD.Marland Kitchen. ~  7/12:  We decided to treat w/ PRED 10mg  Bid for now + PROAIR prn wheezing...  HYPERTENSION (ICD-401.9) - prev on LISINOPRIL 10mg /d; she stopped all of her meds on her own June2012>  ~  cardiac cath 1991 was neg- norm coronaries (she may have had spasm). ~  labs 3/09 showed K=3.8, BUN=32, Creat=1.8 ~  labs 6/10 showed K=4.4, BUN=46, Creat=2.2.Marland Kitchen. rec> stop Maxzide & KCl, ROV 75mo. ~  labs 7/10 showed K=4.6, BUN=58, Creat=1.6.Marland Kitchen. rec> incr fluid intake, stay off Maxzide. ~  5/11:  BP=138/72, tol Rx well, no problems; denies HA, visual changes, CP, palipit, syncope, ch in dyspnea, etc; notes tr edema intermittently. ~  4/12:   BP= 138/84 & she remains essentially asymptomatic; K=3.9, BUN=25, Creat=1.4 ~  7/12:  BP= 122/82 off meds but poor intake, weak, wt loss, etc; K=3.3, BUN=23, Creat=1.3> rec K10caps two daily...  ABNORMAL ELECTROCARDIOGRAM (ICD-794.31) - EKG shows NSR, IVCD, LAD, poor R progression... ~  2DEcho 5/11 ==> ordered but pt never returned to have this done...  CAROTID BRUIT (ICD-785.9) - prev on ASA 81mg /d> she denies cerebral ischemic symptoms; asked to resume ASA 81mg /d... ~  CDopplers 5/11 ==> ordered but pt never returned to have this done... ~  CDopplers completed 5/12 & showed mild plaque in bulbs, 0-39% bilat ICA stenoses, antegrade vertebral flow.  DIABETES MELLITUS (ICD-250.00) - prev on METFORMIN ER 500mg - 1/2 tab daily & appeared to tolerate well (she stopped all meds on her own June2012). ~  labs 1/08 showed BS=172, HgA1c=6.5 & Metformin 500mg /d started... ~  labs 3/09 showed BS= 99, A1c= 5.7 ~  labs 6/10 showed BS= 138, A1c= 6.0.Marland Kitchen. rec> same med. ~  labs 5/11 (wt=142#) showed BS= 141, A1c= 5.8 ~  Labs 4/12 (wt=129#) showed BS= 100, A1c= 5.9.Marland KitchenMarland Kitchen rec to decr Metformin to 1/2 tab each AM. ~  Labs 7/12 (wt=?<120#) & off all meds for 1+mo showed BS=77, A1c=6.2; rec continue MetformER- 1/2Qam (pt on Pred).  HYPOTHYROIDISM (ICD-244.9) - prev on LEVOTHYROID 75 micrograms/d (w/ good energy & clinically euthyroid); she stopped all meds on her own 6/12. ~  labs 1/08 showed TSH=1.84 on 75 micrograms/d... ~  labs 3/09 showed TSH= 5.05 ~  labs 6/10 showed TSH= 1.27 ~  labs 5/11 showed TSH= 0.64 ~  Labs 4/12 on Levo75 showed TSH= 1.61 ~  Labs 7/12 off meds showed TSH= 5.40 & rec to restart 1/2 of the tab daily...  ? of PRIMARY HYPERPARATHYROIDISM (ICD-252.01) - Ca++ levels in the 10.6-11.0 range... PO4- levels in the 2.1-2.4 range... PTH levels slowly rising 40-65... therefore following clinically w/ serial labs >> ~  labs 1/08 showed:  Ca++ 11.0, PTH= 42.4.Marland KitchenMarland KitchenMarland Kitchen ~  labs 3/09 showed Ca= 10.3,  PO4= 3.2, PTH= 50 ~  labs 6/10 showed Ca= 10.9, PO4= 3.7, PTH= 65 ~  labs 5/11 showed Ca= 10.5-10.9, Phos= 3.0, PTH= 111... rec> continue to follow closely. ~  Labs 4/12 showed Ca= 11.0, PO4= not done, PTH= 80... NOT progressive- rec no calc supplements, incr fluids. ~  Labs 7/12 showe4d Ca= 9.9  GERD (ICD-530.81) - remote hx of PUD w/ GIB in the 80's... eval by DrDBrodie in the past... prev on Zantac and Reglan but hasn't filled these since 2009... denies nausea, vomiting, heartburn, diarrhea, constipation, blood in stool, abdominal pain, swelling, gas... she is rec to use OTC Prilosec vs Zantac Prn & avoid Tums etc... ~  7/12:  GI eval by DrDBrodie for adult  FTT, wt loss & difficulty swallowing (?dysphagia for solids/pills/ liqs vs really just refusing to swallow)> Given Megace (pt wouldn't take it due to smell & taste), & did EGD> reported neg & dilatation performed...   COLONIC POLYPS (ICD-211.3) - there is a pos family history of colon cancer (in brother)... last colonoscopy was 5/98 [facility]) showing diminutive polyp (tubular adenoma)... f/u planned 3-5 yrs, but she never went... she is overdue and needs to sched this important procedure but she has repeatedly refused GI referral...  RENAL INSUFFICIENCY (ICD-588.9)  ~  labs 1/08 showed BUN= 21, Creat= 1.2 ~  labs 3/09 showed BUN= 32, Creat= 1.8 ~  labs 6/10 showed BUN= 46, Creat= 2.2.Marland KitchenMarland Kitchen rec to stop Maxzide (on 1/2 daily) ~  labs 7/10 showed BUN= 58, Creat= 1.6.Marland Kitchen. rec> incr fluid intake. ~  labs 5/11 showed BUN= 28, Creat= 1.5 ~  Labs 4/12 showed BUN=25, Creat=1.4 ~  Labs 7/12 showed BUN= 23, Creat= 1.3  HEADACHE (ICD-784.0)  DEGENERATIVE JOINT DISEASE (ICD-715.90) - mod DJD and walks w/ crutches "because of my ankles"... she's seen [facility] in the past... started on low dose Pred 6/10 for this & Asthma> improved & she weaned off the Pred on her own... ~  6/10: CC= "my feet" c/o arthritic pain in feet & ankles... rec> trial  Pred 10mg /d w/ ROV 83mo. ~  7/10: pt states improved on the Pred which has been weaned... ~  7/12:  She is in wheelchair> can't vs won't stand  OSTEOPOROSIS (ICD-733.00) - currently taking ALENDRONATE 70mg /wk, no calcium supplement due to borderline hypercalcemia, MVI + Vit D 1000 u OTC supplement. ~  BMD 3/09 here showed TScores -2.9 in Spine, and -2.9 in right FemNeck... started on Alendronate 70mg /wk. ~  Vit D level 3/09 = 34... rec to start Vit D 1000 u OTC supplement. ~  Vit D level 6/10 = 50 ~  Vit D level 5/11 = 61 ~  5/12:  She needs f/u BMD ==> TScores showed -2.8 in Spine, and -2.7 in bilat FemNecks... Continue Alendronate.  ANXIETY (ICD-300.00)  HEALTH MAINTENANCE: ~  GYN: she hasn't seen GYN (DrCollins) in yrs and needs PAP, Mammogram, etc (she has been reminded to set this up w/ each subseq visit here but declines to do so; asked to let us set this up for her but she declines our assistance). ~  GI:  last colonoscopy 1998 at [facility] by [facility]- sm adenoma removed; advised f/u w/ each subseq visit here but she declines GI follow up; referral made to [facility] for this important f/u procedure but she never went. ~  Immunizations:  had Pneumovax 2001... Tetanus (TDAP) given 5/11... she refuses the seasonal Flu vaccines & is reminded of the importance of these yearly vaccinations.   Past Surgical History  Procedure Date  . Tonsillectomy and adenoidectomy   . Appendectomy     Outpatient Encounter Prescriptions as of 04/21/2011  Medication Sig Dispense Refill  . albuterol (PROAIR HFA) 108 (90 BASE) MCG/ACT inhaler  Take 2 puffs every 4-6H as needed for wheezing...      . Multiple Vitamin (MULTIVITAMIN PO) Take 1 tablet by mouth daily.        Marland Kitchen alendronate (FOSAMAX) 70 MG tablet TAKE 1 TABLET BY MOUTH EVERY WEEK  4 tablet  5  . aspirin 81 MG tablet Take 81 mg by mouth daily.        . Cholecalciferol (VITAMIN D) 1000 UNITS capsule Take 1,000 Units by mouth daily.        Marland Kitchen  LEVOTHROID 75  MCG tablet TAKE 1 TABLET BY MOUTH ONCE DAILY  30 tablet  3  . metFORMIN (GLUCOPHAGE-XR) 500 MG 24 hr tablet TAKE 1/2 TABLET BY MOUTH ONCE DAILY  30 tablet  3  . predniSONE (DELTASONE) 10 MG tablet  Take 1 tab by mouth twice daily...      . sertraline (ZOLOFT) 25 MG tablet Take 1 tablet (25 mg total) by mouth daily.  30 tablet  0  . STOP  FLOVENT      . STOP  Lisinopril      . STOP  MEGACE ORAL       . STOP  THEOPHYLLINE         Allergies  Allergen Reactions  . Cephalexin     REACTION: rash, itching  . Codeine     REACTION: rash, itching  . ZOX:[id number] Acid+Aspartame     REACTION: rash, itching    Review of Systems         See HPI - all other systems neg except as noted... The patient complains of difficulty walking, otherw she is very stoic & denies all symptoms.  The patient denies anorexia, fever, weight loss, weight gain, vision loss, hoarseness, chest pain, syncope, peripheral edema, prolonged cough, headaches, hemoptysis, abdominal pain, melena, hematochezia, severe indigestion/heartburn, hematuria, incontinence, muscle weakness, suspicious skin lesions, transient blindness, depression, unusual weight change, abnormal bleeding, enlarged lymph nodes, and angioedema.     Objective:   Physical Exam     WD, WN, [name], chronically ill appearing, in wheelchair, can't stand unaided. GENERAL:  Alert & sl confused, sl agitated & just wanted to leave... HEENT:  [place]/AT, EOM-wnl, PERRLA, EACs-clear, TMs-wnl, NOSE-clear, THROAT-clear & wnl. NECK:  Supple w/ fairROM; no JVD; normal carotid impulses w/ 1+ bilat bruits; no thyromegaly or nodules palpated; no lymphadenopathy. CHEST:  decr BS bilat, clear to P & A; without wheezes or rales; + scat rhonchi heard...  HEART:  Regular Rhythm; +gr1-2 sys murmur, no rubs or gallops detected... ABDOMEN:  Soft & nontender; normal bowel sounds; no organomegaly or masses palpated... EXT: without deformities, mild arthritic  changes; no varicose veins/ venous insuffic/ or edema. She has bilat canes but unable to stand unaided... NEURO:  CN's intact;  no focal neuro deficits, diffusely weak... DERM:  No lesions noted; no rash etc...   Assessment & Plan:   DEPRESSION/ ?Psychiatric illness/ Adult FTT/ Weakness> Change in personality, mental status, mood, family relations, etc over the past 1-2 months per family; I suggested Hosp for full eval, Brain scans, Psychiatric consultation, etc but they want to keep her out of the hosp (per pt wish) if poss> we discussed plan that REQUIRES her to take meds as outlined, take in adeq nutrition & Glucerna supplements, & drink fluids... Try ZOLOFT 25mg  Qhs to start;  If she cooperates she may be a good patient for Dr. Erick Blinks to see...  ASTHMA>  She has a component of fixed airflow obstruction from her yrs of RADS; she stopped all her prev meds (Pred, Theophyllin, Proair, Flovent); chest sounds OK & we bargained for PRED 10mg  Bid & PROAIR Q4H prn...  HBP>  Controlled off her ACE & we will continue to monitor her BP as her intake (hopefully) improves; K=3.3 & rec to start K10caps- 2 daily...  Abn EKG/ CBruit>  She never went for prev 2DEcho/ CDopplers (ordered 5/11) & seemed oblivious to recommendations; discussed w/ pt> stable on ASA 81mg /d & not interested in aggressive eval or treatments; She  finally had CDopplers done 5/12 & they were OK= mild plaque & 0-39% ICA stenoses...  DM>  She has lost more weight w/ poor intake & has been off all meds for 4-6wks; BS=77, A1c=6.2> we are going to treat w/ Pred10mg Bid & will therefore continue the Metformin ER 500mg - 1/2 tab Qam...  Hypothy>  Prev stable on Synth61mcg dose & TSH 4/12 was 1.61; TSH today off meds shows TSH= 5.40 & she is rec to restart 1/2 of the tab daily...  Hyperpara>  Ca up to 11 and PTH= 80 & not progressive, therefore continue to watch carefully & REC no calc supplements etc; Ca= 9.9 currently.  Renal  insuffic>  Creat stable & she does not appear dehydrated> Creat= 1.3 (stable)...  DJD/ Osteoporosis>  Mod severe DJD & copes well, due for f/u BMD on the Fosamax ==> sl improved, continue same.  Other medical issues as noted >> SED rate is elev at 67, nonspecific & we will monitor in light of decision to treat empirically w/ Pred 10mg  Bid for now.Marland KitchenMarland Kitchen

## 2011-04-21 NOTE — Patient Instructions (Signed)
We reviewed your medications & discussed your situation...  If you want to stay out of the hospital you MUST take your meds and you MUST take in fluids and nutrition...    If you don't- then we will have to hospitalize you for treatment...  Your meds are listed on the med sheet...    We started Zoloft 25mg /d (take at bedtime) for depression...  Today we did some follow up blood work since she has been off all her meds for the last month...    We will call you w/ the results when avail...  Call for any questions.Marland KitchenMarland Kitchen

## 2011-04-21 NOTE — Telephone Encounter (Signed)
Spoke with the pt daughter and she states that Dr. Juanda Chance spoke with Dr. Kriste Basque on Friday when she did the pt endoscopy and was told to have pt seen Dr. Kriste Basque asap. Daughter states the pt is not eating or taking her meds and she wants her to see Dr. Kriste Basque only. Please advise on possible appt time. Thanks. Carron Curie, CMA

## 2011-04-21 NOTE — Telephone Encounter (Signed)
Pt calling because she hasnt heard anything yet.

## 2011-04-22 ENCOUNTER — Other Ambulatory Visit: Payer: Self-pay | Admitting: *Deleted

## 2011-04-22 MED ORDER — POTASSIUM CHLORIDE 10 MEQ PO TBCR: 20.0000 meq | EXTENDED_RELEASE_TABLET | Freq: Every day | ORAL | Status: DC

## 2011-04-22 MED ORDER — METFORMIN HCL ER 500 MG PO TB24: ORAL_TABLET | ORAL | Status: DC

## 2011-04-25 ENCOUNTER — Telehealth: Payer: Self-pay | Admitting: Pulmonary Disease

## 2011-04-25 NOTE — Telephone Encounter (Signed)
Granddaughter is waiting in the lobby for this letter.

## 2011-04-28 ENCOUNTER — Telehealth: Payer: Self-pay | Admitting: Pulmonary Disease

## 2011-04-28 ENCOUNTER — Ambulatory Visit (INDEPENDENT_AMBULATORY_CARE_PROVIDER_SITE_OTHER): Payer: Medicare Other | Admitting: Pulmonary Disease

## 2011-04-28 DIAGNOSIS — K219 Gastro-esophageal reflux disease without esophagitis: Secondary | ICD-10-CM

## 2011-04-28 DIAGNOSIS — R627 Adult failure to thrive: Secondary | ICD-10-CM

## 2011-04-28 DIAGNOSIS — R1310 Dysphagia, unspecified: Secondary | ICD-10-CM

## 2011-04-28 DIAGNOSIS — M810 Age-related osteoporosis without current pathological fracture: Secondary | ICD-10-CM

## 2011-04-28 DIAGNOSIS — E213 Hyperparathyroidism, unspecified: Secondary | ICD-10-CM

## 2011-04-28 DIAGNOSIS — M1990 Unspecified osteoarthritis, unspecified site: Secondary | ICD-10-CM

## 2011-04-28 DIAGNOSIS — E039 Hypothyroidism, unspecified: Secondary | ICD-10-CM

## 2011-04-28 DIAGNOSIS — I10 Essential (primary) hypertension: Secondary | ICD-10-CM

## 2011-04-28 DIAGNOSIS — J45909 Unspecified asthma, uncomplicated: Secondary | ICD-10-CM

## 2011-04-28 DIAGNOSIS — F329 Major depressive disorder, single episode, unspecified: Secondary | ICD-10-CM

## 2011-04-28 DIAGNOSIS — N259 Disorder resulting from impaired renal tubular function, unspecified: Secondary | ICD-10-CM

## 2011-04-28 DIAGNOSIS — E119 Type 2 diabetes mellitus without complications: Secondary | ICD-10-CM

## 2011-04-28 MED ORDER — POTASSIUM CHLORIDE 20 MEQ/15ML (10%) PO LIQD: ORAL | Status: DC

## 2011-04-28 MED ORDER — SERTRALINE HCL 50 MG PO TABS: 50.0000 mg | ORAL_TABLET | Freq: Every day | ORAL | Status: DC

## 2011-04-28 MED ORDER — METFORMIN HCL 500 MG/5ML PO SOLN: ORAL | Status: DC

## 2011-04-28 NOTE — Telephone Encounter (Signed)
Called and spoke with the pharamcy and they are aware of change in potassium and to keep the metformin the same directions.

## 2011-04-28 NOTE — Progress Notes (Signed)
Subjective:    Patient ID: Ashley Norman, female    DOB: Sep 16, 1928, 77 y.o.   MRN: [id number]  HPI [name] here for a follow up visit... she has mult med problems as listed below...   ~  Jan 27, 2010:  she never ret for f/u visit & weaned the Pred on her own to 1/2 tab=5mg  Qod... she notes breathing has remained good & arthritis improved- "all except my feet" & still walks w/ canes (offered eval by Ashley Norman & she will think about it)...  she reports Asthma stable on meds;  BP controlled on Lisinopril w/o side effects;  remains on Metformin daily & not checking BS at home;  due for f/u thyroid & parathyroid labs, etc <SEE BELOW> she refuses yearly Flu vaccines but will accept a TDAP today.  ~  January 24, 2011:  Yearly ROV "just getting older" she says;  She weaned off her Pred & hasn't taken this in months, breathing fair- some congestion, wheezing, cough... BP controlled;  Denies CP, plapit, cerebral ischemic symptoms;  She never did the CDopplers as requested last yr "I'm getting forgetful" she says;  Not checking sugars at home (last A1c= 5.8 to 6.0);  Due for f/u thyroid & parathyroid blood work as well... We will sched the CDopplers (neg w/ 0-39% bilat ICAstenoses) and f/u BMD (TScores -2.8 Spine, & -2.7 FemNecks) at her convenience...  ~  April 21, 2011:  20mo ROV & add on at family request> She has hx of Asthma/COPD, HBP, DM, Hypothyroid, Hyperparathyroid, Renal Insuffic, DJD, Osteoporosis> they relate a bizarre story going back over 4-6wks of confusion, very stubborn, refusing to take meds/ eat much/ or drink fluids; sounding depressed & some of her conversation w/ daughter had vailed suicide threats; family didn't seek med attention/ ER/ Psyche & tried to encourage her, changed her venue, & accommodate her in any way they could> but all to no avail...  They present today after an ordeal getting her into the office (took 1-2H to get her dressed & into the car, then wouldn't get out at the  office); all Ashley Norman can say is "can I leave yet", her biggest concern being that we will put her in the hosp which she refuses;  Exam reveals VS- ok, Chest- clear, gr 1/6 SEM, thin abd- nontender, diffusely weak, not making good sense, delay relaxation phase of DTRs;  They are conflicted & not sure which way to go> I suggested HOSP for full eval & Psychiatric consultation, but they prefer to try outpt RX if poss> we discussed an agreement= she will take meds, eat some, & drink Glucerna & fluids; and we will check labs & try to keep her out of the hosp if poss...    Family brought her in to see Ashley Norman w/ adult FTT, wt loss & difficulty swallowing (?dysphagia for solids, pills, & liqs vs really just refusing to swallow)> Given Megace (pt wouldn't take it due to smell & taste), & did EGD> reported neg & dilatation performed...   ~  April 28, 2011:  1wk ROV & the promise was that she would coop w/ eating, drinking, taking meds & we would endeavor to keep her out of the hosp> family reports that she is somewhat improved- taking meds, eating, drinking, etc; but difficulty w/ big pills & wants Liq for Metformin (give RIOMET) & KCl elixir;  She is making it to the bathroom & they use depends prn (they do not want BSC);  Resting ok, bowel movements ok; on Zoloft 25mg  & seems to be helping...    We discussed incr Zoloft to 50mg /d, order home PT/ OT, she is asked to continue the better intake, etc...         Problem List:    ASTHMA (ICD-493.90) - she is a non-smoker> Prev on TheoDur 300mg /d, PROAIR HFA 2spBid &Prn, FLOVENT 220 2spBid (off prev Pred);  She stopped all of her meds on her own in June2012 (?why)... ~  baseline CXR w/ hyperinflation, tort Ao, NAD... ~  4/12:  Notes some cough, congestion, intermittent wheezing> on Theo/ Proair/ Flovent;  Rec- Mucinex/ Fluids... ~  CXR 4/12 showed ectatic Ao, hyperinflation c/w emphysema, clear/ NAD.Marland Kitchen. ~  7/12:  We decided to treat w/ PRED 10mg  Bid for now +  PROAIR prn wheezing...  HYPERTENSION (ICD-401.9) - prev on LISINOPRIL 10mg /d; she stopped all of her meds on her own June2012>  ~  cardiac cath 1991 was neg- norm coronaries (she may have had spasm). ~  labs 3/09 showed K=3.8, BUN=32, Creat=1.8 ~  labs 6/10 showed K=4.4, BUN=46, Creat=2.2.Marland Kitchen. rec> stop Maxzide & KCl, ROV 55mo. ~  labs 7/10 showed K=4.6, BUN=58, Creat=1.6.Marland Kitchen. rec> incr fluid intake, stay off Maxzide. ~  5/11:  BP=138/72, tol Rx well, no problems; denies HA, visual changes, CP, palipit, syncope, ch in dyspnea, etc; notes tr edema intermittently. ~  4/12:  BP= 138/84 & she remains essentially asymptomatic; K=3.9, BUN=25, Creat=1.4 ~  7/12:  BP= 122/82 off meds but poor intake, weak, wt loss, etc; K=3.3, BUN=23, Creat=1.3> rec K10caps two daily...  ABNORMAL ELECTROCARDIOGRAM (ICD-794.31) - EKG shows NSR, IVCD, LAD, poor R progression... ~  2DEcho 5/11 ==> ordered but pt never returned to have this done...  CAROTID BRUIT (ICD-785.9) - prev on ASA 81mg /d> she denies cerebral ischemic symptoms; asked to resume ASA 81mg /d... ~  CDopplers 5/11 ==> ordered but pt never returned to have this done... ~  CDopplers completed 5/12 & showed mild plaque in bulbs, 0-39% bilat ICA stenoses, antegrade vertebral flow.  DIABETES MELLITUS (ICD-250.00) - prev on METFORMIN ER 500mg - 1/2 tab daily & appeared to tolerate well (she stopped all meds on her own June2012). ~  labs 1/08 showed BS=172, HgA1c=6.5 & Metformin 500mg /d started... ~  labs 3/09 showed BS= 99, A1c= 5.7 ~  labs 6/10 showed BS= 138, A1c= 6.0.Marland Kitchen. rec> same med. ~  labs 5/11 (wt=142#) showed BS= 141, A1c= 5.8 ~  Labs 4/12 (wt=129#) showed BS= 100, A1c= 5.9.Marland KitchenMarland Kitchen rec to decr Metformin to 1/2 tab each AM. ~  Labs 7/12 (wt=?<120#) & off all meds for 1+mo showed BS=77, A1c=6.2; rec continue MetformER- 1/2Qam (pt on Pred).  HYPOTHYROIDISM (ICD-244.9) - prev on LEVOTHYROID 75 micrograms/d (w/ good energy & clinically euthyroid); she stopped all  meds on her own 6/12. ~  labs 1/08 showed TSH=1.84 on 75 micrograms/d... ~  labs 3/09 showed TSH= 5.05 ~  labs 6/10 showed TSH= 1.27 ~  labs 5/11 showed TSH= 0.64 ~  Labs 4/12 on Levo75 showed TSH= 1.61 ~  Labs 7/12 off meds showed TSH= 5.40 & rec to restart 1/2 of the tab daily...  ? of PRIMARY HYPERPARATHYROIDISM (ICD-252.01) - Ca++ levels in the 10.6-11.0 range... PO4- levels in the 2.1-2.4 range... PTH levels slowly rising 40-65... therefore following clinically w/ serial labs >> ~  labs 1/08 showed:  Ca++ 11.0, PTH= 42.4.Marland KitchenMarland KitchenMarland Kitchen ~  labs 3/09 showed Ca= 10.3, PO4= 3.2, PTH= 50 ~  labs 6/10 showed Ca= 10.9, PO4=  3.7, PTH= 65 ~  labs 5/11 showed Ca= 10.5-10.9, Phos= 3.0, PTH= 111... rec> continue to follow closely. ~  Labs 4/12 showed Ca= 11.0, PO4= not done, PTH= 80... NOT progressive- rec no calc supplements, incr fluids. ~  Labs 7/12 showe4d Ca= 9.9  GERD (ICD-530.81) - remote hx of PUD w/ GIB in the 80's... eval by Ashley Norman in the past... prev on Zantac and Reglan but hasn't filled these since 2009... denies nausea, vomiting, heartburn, diarrhea, constipation, blood in stool, abdominal pain, swelling, gas... she is rec to use OTC Prilosec vs Zantac Prn & avoid Tums etc... ~  7/12:  GI eval by Ashley Norman for adult FTT, wt loss & difficulty swallowing (?dysphagia for solids/pills/ liqs vs really just refusing to swallow)> Given Megace (pt wouldn't take it due to smell & taste), & did EGD> reported neg & dilatation performed...   COLONIC POLYPS (ICD-211.3) - there is a pos family history of colon cancer (in brother)... last colonoscopy was 5/98 [facility]) showing diminutive polyp (tubular adenoma)... f/u planned 3-5 yrs, but she never went... she is overdue and needs to sched this important procedure but she has repeatedly refused GI referral...  RENAL INSUFFICIENCY (ICD-588.9)  ~  labs 1/08 showed BUN= 21, Creat= 1.2 ~  labs 3/09 showed BUN= 32, Creat= 1.8 ~  labs 6/10 showed BUN= 46,  Creat= 2.2.Marland KitchenMarland Kitchen rec to stop Maxzide (on 1/2 daily) ~  labs 7/10 showed BUN= 58, Creat= 1.6.Marland Kitchen. rec> incr fluid intake. ~  labs 5/11 showed BUN= 28, Creat= 1.5 ~  Labs 4/12 showed BUN=25, Creat=1.4 ~  Labs 7/12 showed BUN= 23, Creat= 1.3  HEADACHE (ICD-784.0)  DEGENERATIVE JOINT DISEASE (ICD-715.90) - mod DJD and walks w/ crutches "because of my ankles"... she's seen [facility] in the past... started on low dose Pred 6/10 for this & Asthma> improved & she weaned off the Pred on her own... ~  6/10: CC= "my feet" c/o arthritic pain in feet & ankles... rec> trial Pred 10mg /d w/ ROV 76mo. ~  7/10: pt states improved on the Pred which has been weaned... ~  7/12:  She is in wheelchair> can't vs won't stand  OSTEOPOROSIS (ICD-733.00) - currently taking ALENDRONATE 70mg /wk, no calcium supplement due to borderline hypercalcemia, MVI + Vit D 1000 u OTC supplement. ~  BMD 3/09 here showed TScores -2.9 in Spine, and -2.9 in right FemNeck... started on Alendronate 70mg /wk. ~  Vit D level 3/09 = 34... rec to start Vit D 1000 u OTC supplement. ~  Vit D level 6/10 = 50 ~  Vit D level 5/11 = 61 ~  5/12:  She needs f/u BMD ==> TScores showed -2.8 in Spine, and -2.7 in bilat FemNecks... Continue Alendronate.  ANXIETY (ICD-300.00)  HEALTH MAINTENANCE: ~  GYN: she hasn't seen GYN (DrCollins) in yrs and needs PAP, Mammogram, etc (she has been reminded to set this up w/ each subseq visit here but declines to do so; asked to let us set this up for her but she declines our assistance). ~  GI:  last colonoscopy 1998 at [facility] by [facility]- sm adenoma removed; advised f/u w/ each subseq visit here but she declines GI follow up; referral made to [facility] for this important f/u procedure but she never went. ~  Immunizations:  had Pneumovax 2001... Tetanus (TDAP) given 5/11... she refuses the seasonal Flu vaccines & is reminded of the importance of these yearly vaccinations.   Past Surgical History  Procedure Date  .  Tonsillectomy and adenoidectomy   .  Appendectomy     Outpatient Encounter Prescriptions as of 04/21/2011  Medication Sig Dispense Refill  . albuterol (PROAIR HFA) 108 (90 BASE) MCG/ACT inhaler  Take 2 puffs every 4-6H as needed for wheezing...      . Multiple Vitamin (MULTIVITAMIN PO) Take 1 tablet by mouth daily.        Marland Kitchen alendronate (FOSAMAX) 70 MG tablet TAKE 1 TABLET BY MOUTH EVERY WEEK  4 tablet  5  . aspirin 81 MG tablet Take 81 mg by mouth daily.        . Cholecalciferol (VITAMIN D) 1000 UNITS capsule Take 1,000 Units by mouth daily.        Marland Kitchen LEVOTHROID 75 MCG tablet TAKE 1 TABLET BY MOUTH ONCE DAILY  30 tablet  3  . metFORMIN (GLUCOPHAGE-XR) 500 MG 24 hr tablet TAKE 1/2 TABLET BY MOUTH ONCE DAILY  30 tablet  3  . predniSONE (DELTASONE) 10 MG tablet  Take 1 tab by mouth twice daily...      . sertraline (ZOLOFT) 25 MG tablet Take 1 tablet (25 mg total) by mouth daily.  30 tablet  0  . STOP  FLOVENT      . STOP  Lisinopril      . STOP  MEGACE ORAL       . STOP  THEOPHYLLINE         Outpatient Encounter Prescriptions as of 04/28/2011  Medication Sig Dispense Refill  . potassium chloride 20 MEQ/15ML (10%) solution 1 tablesp by mouth every day  500 mL  5  . albuterol (PROAIR HFA) 108 (90 BASE) MCG/ACT inhaler        . alendronate (FOSAMAX) 70 MG tablet TAKE 1 TABLET BY MOUTH EVERY WEEK  4 tablet  5  . aspirin 81 MG tablet Take 81 mg by mouth daily.        . Cholecalciferol (VITAMIN D) 1000 UNITS capsule Take 1,000 Units by mouth daily.        . ferrous sulfate 325 (65 FE) MG tablet Take 325 mg by mouth daily with breakfast.        . levothyroxine (LEVOTHROID) 75 MCG tablet        . Metformin HCl 500 MG/5ML SOLN Take 1/2 tsp by mouth two times daily  473 mL  5  . Multiple Vitamin (MULTIVITAMIN PO) Take 1 tablet by mouth daily.        . predniSONE (DELTASONE) 10 MG tablet        . sertraline (ZOLOFT) 50 MG tablet Take 1 tablet (50 mg total) by mouth daily.  30 tablet  5      Allergies  Allergen Reactions  . Cephalexin     REACTION: rash, itching  . Codeine     REACTION: rash, itching  . JXB:[id number] Acid+Aspartame     REACTION: rash, itching    Review of Systems         See HPI - all other systems neg except as noted... The patient complains of difficulty walking, otherw she is very stoic & denies all symptoms.  The patient denies anorexia, fever, weight loss, weight gain, vision loss, hoarseness, chest pain, syncope, peripheral edema, prolonged cough, headaches, hemoptysis, abdominal pain, melena, hematochezia, severe indigestion/heartburn, hematuria, incontinence, muscle weakness, suspicious skin lesions, transient blindness, depression, unusual weight change, abnormal bleeding, enlarged lymph nodes, and angioedema.     Objective:   Physical Exam     WD, WN, [name], chronically ill appearing, in wheelchair> able to stand  w/ support... GENERAL:  Alert & sl confused, less agitated & more alert... HEENT:  Show Low/AT, EOM-wnl, PERRLA, EACs-clear, TMs-wnl, NOSE-clear, THROAT-clear & wnl. NECK:  Supple w/ fairROM; no JVD; normal carotid impulses w/ 1+ bilat bruits; no thyromegaly or nodules palpated; no lymphadenopathy. CHEST:  decr BS bilat, clear to P & A; without wheezes or rales; + scat rhonchi heard...  HEART:  Regular Rhythm; +gr1-2 sys murmur, no rubs or gallops detected... ABDOMEN:  Soft & nontender; normal bowel sounds; no organomegaly or masses palpated... EXT: without deformities, mild arthritic changes; no varicose veins/ venous insuffic/ or edema. She has bilat canes but unable to stand unaided... NEURO:  CN's intact;  no focal neuro deficits, diffusely weak... DERM:  No lesions noted; no rash etc...   Assessment & Plan:   ASTHMA>  She has a component of fixed airflow obstruction from her yrs of RADS; she stopped all her prev meds (Pred, Theophyllin, Proair, Flovent); chest sounds OK & we bargained for PRED 10mg  Qd  & PROAIR Q4H prn...  HBP>  Controlled off her ACE & we will continue to monitor her BP as her intake (hopefully) improves; K=3.3 & rec to start K10caps- 2 daily ==> ch to liquid per request.  Abn EKG/ CBruit>  She never went for prev 2DEcho/ CDopplers (ordered 5/11) & seemed oblivious to recommendations; discussed w/ pt> stable on ASA 81mg /d & not interested in aggressive eval or treatments; She finally had CDopplers done 5/12 & they were OK= mild plaque & 0-39% ICA stenoses...  DM>  She has lost more weight w/ poor intake & has been off all meds for 4-6wks; BS=77, A1c=6.2> we are going to treat w/ Pred10mg Bid & will therefore continue the Metformin ER 500mg - 1/2 tab Qam ==> change to RIOMET liquid 1/2 tsp bid.  Hypothy>  Prev stable on Synth59mcg dose & TSH 4/12 was 1.61; TSH today off meds shows TSH= 5.40 & she is rec to restart 1/2 of the tab daily...  Hyperpara>  Ca up to 11 and PTH= 80 & not progressive, therefore continue to watch carefully & REC no calc supplements etc; Ca= 9.9 currently.  Renal insuffic>  Creat stable & she does not appear dehydrated> Creat= 1.3 (stable)...  DJD/ Osteoporosis>  Mod severe DJD & copes well, due for f/u BMD on the Fosamax ==> sl improved, continue same.  DEPRESSION/ ?Psychiatric illness/ Adult FTT/ Weakness> Change in personality, mental status, mood, family relations, etc over the past 1-2 months per family; I suggested Hosp for full eval, Brain scans, Psychiatric consultation, etc but they want to keep her out of the hosp (per pt wish) if poss> we discussed plan that REQUIRES her to take meds as outlined, take in adeq nutrition & Glucerna supplements, & drink fluids... Try ZOLOFT 25mg  Qhs to start ==> now incr to 50mg /d.  Other medical issues as noted >> SED rate is elev at 67, nonspecific & we will monitor in light of decision to treat empirically w/ Pred 10mg  Bid for now.Marland KitchenMarland Kitchen

## 2011-04-28 NOTE — Patient Instructions (Signed)
Today we updated your meds in EPIC...    We changed the Metformin to liquid RIOMET- 1/2 tsp twice daily...    We changed the Potassium pill to liquid- 1 tsp daily...    We also increased the ZOLOFT/ Sertraline to 50mg /d (2 of the 25mg  tabs, & new Rx for the 50's)...  Keep up the good work w/ the nutrition, fluids, etc...  We will ask the home care company to do an assessment for physical therapy & safety...  Let's plan a recheck in 2 weeks, call sooner for questions or problems.Marland KitchenMarland Kitchen

## 2011-04-29 ENCOUNTER — Telehealth: Payer: Self-pay | Admitting: Pulmonary Disease

## 2011-04-29 NOTE — Telephone Encounter (Signed)
Ashley Norman called to check on the dosing of the potassium.  She stated that her paper said 1 tsp and the rx she picked up said tbsp.  i explained to her that the tbsp dose once daily was correct to give the pt the same amount of potassium that she was taking before.  Ashley Norman voiced her understanding of this and will give as directed

## 2011-05-01 ENCOUNTER — Encounter: Payer: Self-pay | Admitting: Pulmonary Disease

## 2011-05-04 ENCOUNTER — Encounter: Payer: Self-pay | Admitting: Pulmonary Disease

## 2011-05-12 ENCOUNTER — Telehealth: Payer: Self-pay | Admitting: Pulmonary Disease

## 2011-05-12 NOTE — Telephone Encounter (Signed)
I spoke with pt daughter and she states the pt has not c/o any headache, dizziness, chest pain, or vision disturbances. She states she feels like  Her BP has been "creeping" up since she stopped her meds several months ago. Today her BP was 170/90 when the physical therapists was there. Pt daughter aware that SN out of office until AM. Please advise. Carron Curie, CMA Allergies  Allergen Reactions  . Cephalexin     REACTION: rash, itching  . Codeine     REACTION: rash, itching  . ZOX:[id number] Acid+Aspartame     REACTION: rash, itching

## 2011-05-13 MED ORDER — LISINOPRIL 20 MG PO TABS: 20.0000 mg | ORAL_TABLET | Freq: Every day | ORAL | Status: DC

## 2011-05-13 NOTE — Telephone Encounter (Signed)
Spoke with pt's daughter, Betsey Holiday at [phone] and she is aware pt should restart Lisinopril 20 mg daily and pt should keep f/u with SN on Mon., 8/20. RX sent to pharmacy.

## 2011-05-13 NOTE — Telephone Encounter (Signed)
Will ask for tp's suggestions

## 2011-05-13 NOTE — Telephone Encounter (Signed)
Needs to restart lisinopril 20mg  #30 with prn refills, if not improving needs ov, call if above 160--per Jessicalynn Deshong parrett

## 2011-05-13 NOTE — Telephone Encounter (Deleted)
SN out of office today, TP - can you pls help with this?  Thanks!

## 2011-05-16 ENCOUNTER — Other Ambulatory Visit (INDEPENDENT_AMBULATORY_CARE_PROVIDER_SITE_OTHER): Payer: Medicare Other

## 2011-05-16 ENCOUNTER — Encounter: Payer: Self-pay | Admitting: Pulmonary Disease

## 2011-05-16 ENCOUNTER — Ambulatory Visit (INDEPENDENT_AMBULATORY_CARE_PROVIDER_SITE_OTHER): Payer: Medicare Other | Admitting: Pulmonary Disease

## 2011-05-16 DIAGNOSIS — M810 Age-related osteoporosis without current pathological fracture: Secondary | ICD-10-CM

## 2011-05-16 DIAGNOSIS — F329 Major depressive disorder, single episode, unspecified: Secondary | ICD-10-CM

## 2011-05-16 DIAGNOSIS — E039 Hypothyroidism, unspecified: Secondary | ICD-10-CM

## 2011-05-16 DIAGNOSIS — N259 Disorder resulting from impaired renal tubular function, unspecified: Secondary | ICD-10-CM

## 2011-05-16 DIAGNOSIS — E213 Hyperparathyroidism, unspecified: Secondary | ICD-10-CM

## 2011-05-16 DIAGNOSIS — E119 Type 2 diabetes mellitus without complications: Secondary | ICD-10-CM

## 2011-05-16 DIAGNOSIS — F32A Depression, unspecified: Secondary | ICD-10-CM

## 2011-05-16 DIAGNOSIS — I10 Essential (primary) hypertension: Secondary | ICD-10-CM

## 2011-05-16 DIAGNOSIS — M1990 Unspecified osteoarthritis, unspecified site: Secondary | ICD-10-CM

## 2011-05-16 DIAGNOSIS — J45909 Unspecified asthma, uncomplicated: Secondary | ICD-10-CM

## 2011-05-16 DIAGNOSIS — R627 Adult failure to thrive: Secondary | ICD-10-CM

## 2011-05-16 LAB — CBC WITH DIFFERENTIAL/PLATELET
Basophils Absolute: 0 10*3/uL (ref 0.0–0.1)
Eosinophils Absolute: 0 10*3/uL (ref 0.0–0.7)
HCT: 39.5 % (ref 36.0–46.0)
Lymphs Abs: 1.5 10*3/uL (ref 0.7–4.0)
MCHC: 33.5 g/dL (ref 30.0–36.0)
Monocytes Absolute: 0.5 10*3/uL (ref 0.1–1.0)
Monocytes Relative: 5.1 % (ref 3.0–12.0)
Platelets: 188 10*3/uL (ref 150.0–400.0)
RDW: 17.3 % — ABNORMAL HIGH (ref 11.5–14.6)

## 2011-05-16 LAB — SEDIMENTATION RATE: Sed Rate: 22 mm/hr (ref 0–22)

## 2011-05-16 LAB — BASIC METABOLIC PANEL
BUN: 23 mg/dL (ref 6–23)
GFR: 55.92 mL/min — ABNORMAL LOW (ref >60.00)
Glucose, Bld: 116 mg/dL — ABNORMAL HIGH (ref 70–99)
Potassium: 4.1 mEq/L (ref 3.5–5.1)

## 2011-05-16 LAB — TSH: TSH: 3.47 u[IU]/mL (ref 0.35–5.50)

## 2011-05-16 NOTE — Progress Notes (Signed)
Subjective:    Patient ID: Ashley Norman, female    DOB: 25-Jun-1928, 77 y.o.   MRN: [id number]  HPI [name] here for a follow up visit... she has mult med problems as listed below...   ~  Jan 27, 2010:  she never ret for f/u visit & weaned the Pred on her own to 1/2 tab=5mg  Qod... she notes breathing has remained good & arthritis improved- "all except my feet" & still walks w/ canes (offered eval by Princess Perna & she will think about it)...  she reports Asthma stable on meds;  BP controlled on Lisinopril w/o side effects;  remains on Metformin daily & not checking BS at home;  due for f/u thyroid & parathyroid labs, etc <SEE BELOW> she refuses yearly Flu vaccines but will accept a TDAP today.  ~  January 24, 2011:  Yearly ROV "just getting older" she says;  She weaned off her Pred & hasn't taken this in months, breathing fair- some congestion, wheezing, cough... BP controlled;  Denies CP, plapit, cerebral ischemic symptoms;  She never did the CDopplers as requested last yr "I'm getting forgetful" she says;  Not checking sugars at home (last A1c= 5.8 to 6.0);  Due for f/u thyroid & parathyroid blood work as well... We will sched the CDopplers (neg w/ 0-39% bilat ICAstenoses) and f/u BMD (TScores -2.8 Spine, & -2.7 FemNecks) at her convenience...  ~  April 21, 2011:  11mo ROV & add on at family request> She has hx of Asthma/COPD, HBP, DM, Hypothyroid, Hyperparathyroid, Renal Insuffic, DJD, Osteoporosis> they relate a bizarre story going back over 4-6wks of confusion, very stubborn, refusing to take meds/ eat much/ or drink fluids; sounding depressed & some of her conversation w/ daughter had vailed suicide threats; family didn't seek med attention/ ER/ Psyche & tried to encourage her, changed her venue, & accommodate her in any way they could> but all to no avail...  They present today after an ordeal getting her into the office (took 1-2H to get her dressed & into the car, then wouldn't get out at the  office); all [name] can say is "can I leave yet", her biggest concern being that we will put her in the hosp which she refuses;  Exam reveals VS- ok, Chest- clear, gr 1/6 SEM, thin abd- nontender, diffusely weak, not making good sense, delay relaxation phase of DTRs;  They are conflicted & not sure which way to go> I suggested HOSP for full eval & Psychiatric consultation, but they prefer to try outpt RX if poss> we discussed an agreement= she will take meds, eat some, & drink Glucerna & fluids; and we will check labs & try to keep her out of the hosp if poss...    Family brought her in to see DrDBrodie w/ adult FTT, wt loss & difficulty swallowing (?dysphagia for solids, pills, & liqs vs really just refusing to swallow)> Given Megace (pt wouldn't take it due to smell & taste), & did EGD> reported neg & dilatation performed...   ~  April 28, 2011:  1wk ROV & the promise was that she would coop w/ eating, drinking, taking meds & we would endeavor to keep her out of the hosp> family reports that she is somewhat improved- taking meds, eating, drinking, etc; but difficulty w/ big pills & wants Liq for Metformin (give RIOMET) & KCl elixir;  She is making it to the bathroom & they use depends prn (they do not want BSC);  Resting ok, bowel movements ok; on Zoloft 25mg  & seems to be helping...    We discussed incr Zoloft to 50mg /d, order home PT/ OT, she is asked to continue the better intake, etc...  ~  May 16, 2011:  2wk ROV & she says "I feel OK, can I leave now"; she continues to improve> seems sl more cooperative & interactive, home PT helping some per family; apparently no pain, no dyspnea, ambulating w/ her canes better;  They called w/ incr BP at home per PT nurse ~170/90 so we restarted her Lisinopril 10mg /d & her BP is better 110/72 today & she denies CP, palpit, syncope, edema, etc...  Still taking Zoloft 50mg /d & they note good days & bad...    LABS today> Chems wnl, BS=116 on her RIOMET 1/2 tsp  Bid, K=4.1 on KCl elixir 1T in juice daily; CBC wnl w/ Hg= 13.2; TSH norm on 1/2 of the daily; & Sed=22 now on the Pred 10mg /d...         Problem List:    ASTHMA (ICD-493.90) - she is a non-smoker> Prev on TheoDur 300mg /d, PROAIR HFA 2spBid &Prn, FLOVENT 220 2spBid;  She stopped all of her meds on her own in June2012 (?why)... ~  baseline CXR w/ hyperinflation, tort Ao, NAD... ~  4/12:  Notes some cough, congestion, intermittent wheezing> on Theo/ Proair/ Flovent;  Rec- Mucinex/ Fluids... ~  CXR 4/12 showed ectatic Ao, hyperinflation c/w emphysema, clear/ NAD.Marland Kitchen. ~  7/12:  We decided to treat w/ PRED 10mg  Bid (weaned to 10mg /d) + PROAIR prn wheezing...  HYPERTENSION (ICD-401.9) - back on LISINOPRIL 10mg /d; she had stopped all of her meds on her own June2012>  ~  cardiac cath 1991 was neg- norm coronaries (she may have had spasm). ~  labs 3/09 showed K=3.8, BUN=32, Creat=1.8 ~  labs 6/10 showed K=4.4, BUN=46, Creat=2.2.Marland Kitchen. rec> stop Maxzide & KCl, ROV 63mo. ~  labs 7/10 showed K=4.6, BUN=58, Creat=1.6.Marland Kitchen. rec> incr fluid intake, stay off Maxzide. ~  5/11:  BP=138/72, tol Rx well, no problems; denies HA, visual changes, CP, palipit, syncope, ch in dyspnea, etc; notes tr edema intermittently. ~  4/12:  BP= 138/84 & she remains essentially asymptomatic; K=3.9, BUN=25, Creat=1.4 ~  7/12:  BP= 122/82 off meds but poor intake, weak, wt loss, etc; K=3.3, BUN=23, Creat=1.3> rec K10caps two daily. ~  8/12:  BP= 110/72 back on Lisinopril 10mg /d due to incr BP at home; K=4.1 on 1tablesp KCL elixir daily.  ABNORMAL ELECTROCARDIOGRAM (ICD-794.31) - EKG shows NSR, IVCD, LAD, poor R progression... ~  2DEcho 5/11 ==> ordered but pt never returned to have this done...  CAROTID BRUIT (ICD-785.9) - back on ASA 81mg /d> she denies cerebral ischemic symptoms... ~  CDopplers 5/11 ==> ordered but pt never returned to have this done... ~  CDopplers completed 5/12 & showed mild plaque in bulbs, 0-39% bilat ICA  stenoses, antegrade vertebral flow.  DIABETES MELLITUS (ICD-250.00) - back on METFORMIN but taking RIOMET 500mg /12ml (1/2 tsp Bid) & appeared to tolerate well... ~  labs 1/08 showed BS=172, HgA1c=6.5 & Metformin 500mg /d started... ~  labs 3/09 showed BS= 99, A1c= 5.7 ~  labs 6/10 showed BS= 138, A1c= 6.0.Marland Kitchen. rec> same med. ~  labs 5/11 (wt=142#) showed BS= 141, A1c= 5.8 ~  Labs 4/12 (wt=129#) showed BS= 100, A1c= 5.9.Marland KitchenMarland Kitchen rec to decr Metformin to 1/2 tab each AM. ~  Labs 7/12 (wt=?<120#) & off all meds for 1+mo showed BS=77, A1c=6.2; rec continue MetformER- 1/2Qam (pt on  Pred). ~  8/12:  Pt switched to Liq RIOMET 1/2 tsp Bid;  Labs showed BS= 116  HYPOTHYROIDISM (ICD-244.9) - prev on LEVOTHYROID 75 micrograms/d (w/ good energy & clinically euthyroid); she stopped all meds on her own 6/12; we restarted LEVOTHYROID - 1/2 tab daily 7/12... ~  labs 1/08 showed TSH=1.84 on 75 micrograms/d... ~  labs 3/09 showed TSH= 5.05 ~  labs 6/10 showed TSH= 1.27 ~  labs 5/11 showed TSH= 0.64 ~  Labs 4/12 on Levo75 showed TSH= 1.61 ~  Labs 7/12 off meds showed TSH= 5.40 & rec to restart 1/2 of the tab daily. ~  Labs 8/12 on Levo75-1/2 showed TSH= 3.47 & rec to continue same.  ? of PRIMARY HYPERPARATHYROIDISM (ICD-252.01) - Ca++ levels in the 10.6-11.0 range... PO4- levels in the 2.1-2.4 range... PTH levels slowly rising 40-65... therefore following clinically w/ serial labs >> ~  labs 1/08 showed:  Ca++ 11.0, PTH= 42.4.Marland KitchenMarland KitchenMarland Kitchen ~  labs 3/09 showed Ca= 10.3, PO4= 3.2, PTH= 50 ~  labs 6/10 showed Ca= 10.9, PO4= 3.7, PTH= 65 ~  labs 5/11 showed Ca= 10.5-10.9, Phos= 3.0, PTH= 111... rec> continue to follow closely. ~  Labs 4/12 showed Ca= 11.0, PO4= not done, PTH= 80... NOT progressive- rec no calc supplements, incr fluids. ~  Labs 7/12 & 8/12 showe4d Ca= 9.9  GERD (ICD-530.81) - remote hx of PUD w/ GIB in the 80's... eval by DrDBrodie in the past... prev on Zantac and Reglan but hasn't filled these since  2009... denies nausea, vomiting, heartburn, diarrhea, constipation, blood in stool, abdominal pain, swelling, gas... she is rec to use OTC Prilosec vs Zantac Prn & avoid Tums etc... ~  7/12:  GI eval by DrDBrodie for adult FTT, wt loss & difficulty swallowing (?dysphagia for solids/pills/ liqs vs really just refusing to swallow)> Given Megace (pt wouldn't take it due to smell & taste), & did EGD> reported neg & dilatation performed...   COLONIC POLYPS (ICD-211.3) - there is a pos family history of colon cancer (in brother)... last colonoscopy was 5/98 [facility]) showing diminutive polyp (tubular adenoma)... f/u planned 3-5 yrs, but she never went... she is overdue and needs to sched this important procedure but she has repeatedly refused GI referral...  RENAL INSUFFICIENCY (ICD-588.9)  ~  labs 1/08 showed BUN= 21, Creat= 1.2 ~  labs 3/09 showed BUN= 32, Creat= 1.8 ~  labs 6/10 showed BUN= 46, Creat= 2.2.Marland KitchenMarland Kitchen rec to stop Maxzide (on 1/2 daily) ~  labs 7/10 showed BUN= 58, Creat= 1.6.Marland Kitchen. rec> incr fluid intake. ~  labs 5/11 showed BUN= 28, Creat= 1.5 ~  Labs 4/12 showed BUN=25, Creat=1.4 ~  Labs 7/12 showed BUN= 23, Creat= 1.3 ~  Labs 8/12 showed BUN= 23, Creat= 1.0  HEADACHE (ICD-784.0)  DEGENERATIVE JOINT DISEASE (ICD-715.90) - mod DJD and walks w/ crutches "because of my ankles"... she's seen [facility] in the past... started on low dose Pred 6/10 for this & Asthma> improved & she weaned off the Pred on her own... ~  6/10: CC= "my feet" c/o arthritic pain in feet & ankles... rec> trial Pred 10mg /d w/ ROV 90mo. ~  7/10: pt states improved on the Pred which has been weaned... ~  7/12:  She is in wheelchair> can't vs won't stand  OSTEOPOROSIS (ICD-733.00) - currently taking ALENDRONATE 70mg /wk, no calcium supplement due to borderline hypercalcemia, MVI + Vit D 1000 u OTC supplement. ~  BMD 3/09 here showed TScores -2.9 in Spine, and -2.9 in right FemNeck... started  on Alendronate  70mg /wk. ~  Vit D level 3/09 = 34... rec to start Vit D 1000 u OTC supplement. ~  Vit D level 6/10 = 50 ~  Vit D level 5/11 = 61 ~  5/12:  She needs f/u BMD ==> TScores showed -2.8 in Spine, and -2.7 in bilat FemNecks> Continue Alendronate, MVI, Vit D.  ANXIETY (ICD-300.00) >> she has a mild dementia as well w/ odd affect etc; on ZOLOFT 50mg /d & seems improved per family...  HEALTH MAINTENANCE: ~  GYN: she hasn't seen GYN (DrCollins) in yrs and needs PAP, Mammogram, etc (she has been reminded to set this up w/ each subseq visit here but declines to do so; asked to let us set this up for her but she declines our assistance). ~  GI:  last colonoscopy 1998 at [facility] by [facility]- sm adenoma removed; advised f/u w/ each subseq visit here but she declines GI follow up; referral made to [facility] for this important f/u procedure but she never went. ~  Immunizations:  had Pneumovax 2001... Tetanus (TDAP) given 5/11... she refuses the seasonal Flu vaccines & is reminded of the importance of these yearly vaccinations.   Past Surgical History  Procedure Date  . Tonsillectomy and adenoidectomy   . Appendectomy     Outpatient Encounter Prescriptions as of 05/16/2011  Medication Sig Dispense Refill  . albuterol (PROAIR HFA) 108 (90 BASE) MCG/ACT inhaler        . alendronate (FOSAMAX) 70 MG tablet TAKE 1 TABLET BY MOUTH EVERY WEEK  4 tablet  5  . aspirin 81 MG tablet Take 81 mg by mouth daily.        . Cholecalciferol (VITAMIN D) 1000 UNITS capsule Take 1,000 Units by mouth daily.        . ferrous sulfate 325 (65 FE) MG tablet Take 325 mg by mouth daily with breakfast.        . levothyroxine (LEVOTHROID) 75 MCG tablet 1/2 tablet daily      . lisinopril (PRINIVIL,ZESTRIL) 10 MG tablet Take 10 mg by mouth daily.        . Metformin HCl (RIOMET) 500 MG/5ML SOLN Take 1/2 teaspoon twice daily       . Multiple Vitamin (MULTIVITAMIN PO) Take 1 tablet by mouth daily.        . potassium chloride 20 MEQ/15ML (10%)  solution 1 tbs by mouth every day  450 mL  5  . predniSONE (DELTASONE) 10 MG tablet Take 1 tab daily...      . sertraline (ZOLOFT) 50 MG tablet Take 1 tablet (50 mg total) by mouth daily.  30 tablet  5     Allergies  Allergen Reactions  . Cephalexin     REACTION: rash, itching  . Codeine     REACTION: rash, itching  . ZOX:[id number] Acid+Aspartame     REACTION: rash, itching    Review of Systems         See HPI - all other systems neg except as noted... The patient complains of difficulty walking, otherw she is very stoic & denies all symptoms.  The patient denies anorexia, fever, weight loss, weight gain, vision loss, hoarseness, chest pain, syncope, peripheral edema, prolonged cough, headaches, hemoptysis, abdominal pain, melena, hematochezia, severe indigestion/heartburn, hematuria, incontinence, muscle weakness, suspicious skin lesions, transient blindness, depression, unusual weight change, abnormal bleeding, enlarged lymph nodes, and angioedema.     Objective:   Physical Exam     WD, WN, [name], chronically  ill appearing, in wheelchair> able to stand w/ support... GENERAL:  Alert & sl confused, less agitated & more alert... HEENT:  [place]/AT, EOM-wnl, PERRLA, EACs-clear, TMs-wnl, NOSE-clear, THROAT-clear & wnl. NECK:  Supple w/ fairROM; no JVD; normal carotid impulses w/ 1+ bilat bruits; no thyromegaly or nodules palpated; no lymphadenopathy. CHEST:  decr BS bilat, clear to P & A; without wheezes or rales; + scat rhonchi heard...  HEART:  Regular Rhythm; +gr1-2 sys murmur, no rubs or gallops detected... ABDOMEN:  Soft & nontender; normal bowel sounds; no organomegaly or masses palpated... EXT: without deformities, mild arthritic changes; no varicose veins/ venous insuffic/ or edema. She has bilat canes but unable to stand unaided... NEURO:  CN's intact;  no focal neuro deficits, diffusely weak... DERM:  No lesions noted; no rash etc...   Assessment &  Plan:   ADULT FTT>  She is definitely better, slowly improving in all spheres; continue current meds, PT, nutrition, etc...  ASTHMA>  She has a component of fixed airflow obstruction from her yrs of RADS; she stopped all her prev meds (Pred, Theophyllin, Proair, Flovent); chest sounds OK & we bargained for PRED 10mg  Qd & PROAIR Q4H prn> continue same for now...  HBP>  Controlled back on her ACE & we will continue to monitor her BP as her intake (hopefully) improves; K= 4.1 on the elixir 1 tablesp daily...  Abn EKG/ CBruit>  She never went for prev 2DEcho/ CDopplers (ordered 5/11) & seemed oblivious to recommendations; discussed w/ pt> stable on ASA 81mg /d & not interested in aggressive eval or treatments; She finally had CDopplers done 5/12 & they were OK= mild plaque & 0-39% ICA stenoses> rec continue ASA.  DM>  Her wt today= 116# & she looks better, eating better per family; BS= 116 on the Pred 10mg /d & the RIOMET 500mg /26ml taking 1/2 tsp bid, continue same...  Hypothy>  Prev stable on Synth9mcg dose & TSH 4/12 was 1.61; TSH today off meds = 5.40 & she restarted 1/2 of the tab daily; f/u TSH 8/12 = 3.47 & stable, continue same...  Hyperpara>  Prev Ca up to 11 and PTH= 80 & not progressive, therefore continue to watch carefully & REC no calc supplements etc; Ca= 9.9 currently & stable...  Renal insuffic>  Creat has improved w/ better intake to 1.0 today...  DJD/ Osteoporosis>  Mod severe DJD & copes well, due for f/u BMD on the Fosamax ==> sl improved, continue same.  DEPRESSION/ ?Psychiatric illness/ Adult FTT/ Weakness> Change in personality, mental status, mood, family relations, etc over the past 1-2 months per family; I suggested Hosp for full eval, Brain scans, Psychiatric consultation, etc but they want to keep her out of the hosp (per pt wish)> we discussed plan that REQUIRES her to take meds as outlined, take in adeq nutrition & Glucerna supplements, & drink fluids... Try ZOLOFT  25mg  Qhs to start ==> now incr to 50mg /d & slowly improved...  Other medical issues as noted >> SED rate is elev at 67 ==> 22 on the Pred etc..Marland Kitchen

## 2011-05-16 NOTE — Patient Instructions (Signed)
Today we updated your med list in EPIC...    Continue your current meds the same for now...  Today we did your follow up blood work...    Please call the PHONE TREE in a few days for your results...    Dial [id number] & when prompted enter your patient number followed by the # symbol...    Your patient number is:   [id number]#  Call for any questions...  Let's plan a follow up visit in about 1 month.Marland KitchenMarland Kitchen

## 2011-05-27 ENCOUNTER — Telehealth: Payer: Self-pay | Admitting: Pulmonary Disease

## 2011-05-27 NOTE — Telephone Encounter (Signed)
Spoke with [employer]. She states that she has been unable to reach the pt after several attempts. Wanted to let SN know this as FYI.

## 2011-05-27 NOTE — Telephone Encounter (Signed)
[id number] 

## 2011-06-04 ENCOUNTER — Other Ambulatory Visit: Payer: Self-pay | Admitting: Pulmonary Disease

## 2011-06-20 ENCOUNTER — Ambulatory Visit: Payer: Medicare Other | Admitting: Pulmonary Disease

## 2011-07-27 ENCOUNTER — Ambulatory Visit: Payer: Medicare Other | Admitting: Pulmonary Disease

## 2011-08-03 ENCOUNTER — Other Ambulatory Visit: Payer: Self-pay | Admitting: Pulmonary Disease

## 2011-09-01 ENCOUNTER — Other Ambulatory Visit: Payer: Self-pay | Admitting: Pulmonary Disease

## 2011-09-07 ENCOUNTER — Other Ambulatory Visit: Payer: Self-pay | Admitting: Pulmonary Disease

## 2011-09-09 ENCOUNTER — Other Ambulatory Visit: Payer: Self-pay | Admitting: Pulmonary Disease

## 2011-09-13 ENCOUNTER — Other Ambulatory Visit: Payer: Self-pay | Admitting: Pulmonary Disease

## 2011-09-21 ENCOUNTER — Other Ambulatory Visit: Payer: Self-pay | Admitting: Pulmonary Disease

## 2011-09-26 ENCOUNTER — Other Ambulatory Visit (INDEPENDENT_AMBULATORY_CARE_PROVIDER_SITE_OTHER): Payer: Medicare Other

## 2011-09-26 ENCOUNTER — Ambulatory Visit (INDEPENDENT_AMBULATORY_CARE_PROVIDER_SITE_OTHER): Payer: Medicare Other | Admitting: Pulmonary Disease

## 2011-09-26 ENCOUNTER — Ambulatory Visit (INDEPENDENT_AMBULATORY_CARE_PROVIDER_SITE_OTHER)
Admission: RE | Admit: 2011-09-26 | Discharge: 2011-09-26 | Disposition: A | Discharge status: Home / Self Care | Payer: Medicare Other | Source: Ambulatory Visit | Attending: Pulmonary Disease | Admitting: Pulmonary Disease

## 2011-09-26 ENCOUNTER — Encounter: Payer: Self-pay | Admitting: Pulmonary Disease

## 2011-09-26 ENCOUNTER — Other Ambulatory Visit: Payer: Self-pay | Admitting: Pulmonary Disease

## 2011-09-26 VITALS — BP 110/68 | HR 113 | Temp 97.8°F | Ht 68.0 in | Wt 113.0 lb

## 2011-09-26 DIAGNOSIS — E119 Type 2 diabetes mellitus without complications: Secondary | ICD-10-CM

## 2011-09-26 DIAGNOSIS — F411 Generalized anxiety disorder: Secondary | ICD-10-CM

## 2011-09-26 DIAGNOSIS — M1990 Unspecified osteoarthritis, unspecified site: Secondary | ICD-10-CM

## 2011-09-26 DIAGNOSIS — R0989 Other specified symptoms and signs involving the circulatory and respiratory systems: Secondary | ICD-10-CM

## 2011-09-26 DIAGNOSIS — R0600 Dyspnea, unspecified: Secondary | ICD-10-CM

## 2011-09-26 DIAGNOSIS — J45901 Unspecified asthma with (acute) exacerbation: Secondary | ICD-10-CM

## 2011-09-26 DIAGNOSIS — M810 Age-related osteoporosis without current pathological fracture: Secondary | ICD-10-CM

## 2011-09-26 DIAGNOSIS — I10 Essential (primary) hypertension: Secondary | ICD-10-CM

## 2011-09-26 DIAGNOSIS — E213 Hyperparathyroidism, unspecified: Secondary | ICD-10-CM

## 2011-09-26 DIAGNOSIS — D126 Benign neoplasm of colon, unspecified: Secondary | ICD-10-CM

## 2011-09-26 DIAGNOSIS — K219 Gastro-esophageal reflux disease without esophagitis: Secondary | ICD-10-CM

## 2011-09-26 DIAGNOSIS — E039 Hypothyroidism, unspecified: Secondary | ICD-10-CM

## 2011-09-26 DIAGNOSIS — R1310 Dysphagia, unspecified: Secondary | ICD-10-CM

## 2011-09-26 DIAGNOSIS — R627 Adult failure to thrive: Secondary | ICD-10-CM

## 2011-09-26 DIAGNOSIS — R0609 Other forms of dyspnea: Secondary | ICD-10-CM

## 2011-09-26 LAB — CBC WITH DIFFERENTIAL/PLATELET
Basophils Relative: 0.5 % (ref 0.0–3.0)
Eosinophils Absolute: 0.6 10*3/uL (ref 0.0–0.7)
MCHC: 33.2 g/dL (ref 30.0–36.0)
MCV: 87 fl (ref 78.0–100.0)
Monocytes Absolute: 0.3 10*3/uL (ref 0.1–1.0)
Neutrophils Relative %: 58 % (ref 43.0–77.0)
RBC: 4.77 Mil/uL (ref 3.87–5.11)

## 2011-09-26 LAB — BASIC METABOLIC PANEL
BUN: 19 mg/dL (ref 6–23)
CO2: 28 mEq/L (ref 19–32)
Chloride: 104 mEq/L (ref 96–112)
Creatinine, Ser: 1.1 mg/dL (ref 0.4–1.2)

## 2011-09-26 LAB — BRAIN NATRIURETIC PEPTIDE: Pro B Natriuretic peptide (BNP): 61 pg/mL (ref 0.0–100.0)

## 2011-09-26 MED ORDER — FLUTICASONE PROPIONATE HFA 220 MCG/ACT IN AERO: 2.0000 | INHALATION_SPRAY | Freq: Two times a day (BID) | RESPIRATORY_TRACT | Status: DC

## 2011-09-26 MED ORDER — ALBUTEROL SULFATE HFA 108 (90 BASE) MCG/ACT IN AERS: 2.0000 | INHALATION_SPRAY | Freq: Four times a day (QID) | RESPIRATORY_TRACT | Status: DC | PRN

## 2011-09-26 MED ORDER — TIOTROPIUM BROMIDE MONOHYDRATE 18 MCG IN CAPS: 18.0000 ug | ORAL_CAPSULE | Freq: Every day | RESPIRATORY_TRACT | Status: DC

## 2011-09-26 MED ORDER — AZITHROMYCIN 250 MG PO TABS: ORAL_TABLET | ORAL | Status: AC

## 2011-09-26 NOTE — Patient Instructions (Signed)
Today we updated your med list in our EPIC system...    We decided to add SPIRIVA to your COPD regimen> inhale the contents of 1 capsule via HANDIHALER every day...    Increase the PREDNISONE back to 10mg  per day...    Add the OTC MUCINEX DM 1-2 tabs twice daily w/ fluids...    Continue the Proair/ Flovent combination twice daily on a regular schedule...  We also wrote a new prescription for a ZPak to take over the next 5d as directed on the pack...  Today we did your follow up CXR & blood work...    Please call the PHONE TREE in a few days for your results...    Dial [id number] & when prompted enter your patient number followed by the # symbol...    Your patient number is:  [id number]#  Be sure to eat a little something at each mealtime & take the ENSURE betw meals as a supplement...  Call for any questions...  Let's plan a follow up visit in 2 months for recheck.Marland KitchenMarland Kitchen

## 2011-09-26 NOTE — Progress Notes (Signed)
Subjective:    Patient ID: Ashley Norman, female    DOB: 07-04-1928, 77 y.o.   MRN: [id number]  HPI [name] here for a follow up visit... she has mult med problems as listed below...   ~  January 24, 2011:  Yearly ROV "just getting older" she says;  She weaned off her Pred & hasn't taken this in months, breathing fair- some congestion, wheezing, cough... BP controlled;  Denies CP, plapit, cerebral ischemic symptoms;  She never did the CDopplers as requested last yr "I'm getting forgetful" she says;  Not checking sugars at home (last A1c= 5.8 to 6.0);  Due for f/u thyroid & parathyroid blood work as well... We will sched the CDopplers (neg w/ 0-39% bilat ICAstenoses) and f/u BMD (TScores -2.8 Spine, & -2.7 FemNecks) at her convenience...  ~  April 21, 2011:  32mo ROV & add on at family request> She has hx of Asthma/COPD, HBP, DM, Hypothyroid, Hyperparathyroid, Renal Insuffic, DJD, Osteoporosis> they relate a bizarre story going back over 4-6wks of confusion, very stubborn, refusing to take meds/ eat much/ or drink fluids; sounding depressed & some of her conversation w/ daughter had vailed suicide threats; family didn't seek med attention/ ER/ Psyche & tried to encourage her, changed her venue, & accommodate her in any way they could> but all to no avail...  They present today after an ordeal getting her into the office (took 1-2H to get her dressed & into the car, then wouldn't get out at the office); all Ashley Norman can say is "can I leave yet", her biggest concern being that we will put her in the hosp which she refuses;  Exam reveals VS- ok, Chest- clear, gr 1/6 SEM, thin abd- nontender, diffusely weak, not making good sense, delay relaxation phase of DTRs;  They are conflicted & not sure which way to go> I suggested HOSP for full eval & Psychiatric consultation, but they prefer to try outpt RX if poss> we discussed an agreement= she will take meds, eat some, & drink Glucerna & fluids; and we will check  labs & try to keep her out of the hosp if poss...    Family brought her in to see Ashley Norman w/ adult FTT, wt loss & difficulty swallowing (?dysphagia for solids, pills, & liqs vs really just refusing to swallow)> Given Ashley Norman (pt wouldn't take it due to smell & taste), & did EGD> reported neg & dilatation performed...   ~  April 28, 2011:  1wk ROV & the promise was that she would coop w/ eating, drinking, taking meds & we would endeavor to keep her out of the hosp> family reports that she is somewhat improved- taking meds, eating, drinking, etc; but difficulty w/ big pills & wants Liq for Metformin (give RIOMET) & KCl elixir;  She is making it to the bathroom & they use depends prn (they do not want BSC);  Resting ok, bowel movements ok; on Zoloft 25mg  & seems to be helping...    We discussed incr Zoloft to 50mg /d, order home PT/ OT, she is asked to continue the better intake, etc...  ~  May 16, 2011:  2wk ROV & she says "I feel OK, can I leave now"; she continues to improve> seems sl more cooperative & interactive, home PT helping some per family; apparently no pain, no dyspnea, ambulating w/ her canes better;  They called w/ incr BP at home per PT nurse ~170/90 so we restarted her Lisinopril 10mg /d & her  BP is better 110/72 today & she denies CP, palpit, syncope, edema, etc...  Still taking Zoloft 50mg /d & they note good days & bad...    LABS today> Chems wnl, BS=116 on her RIOMET 1/2 tsp Bid, K=4.1 on KCl elixir 1T in juice daily; CBC wnl w/ Hg= 13.2; TSH norm on 1/2 of the daily; & Sed=22 now on the Pred 10mg /d...  ~  September 26, 2011:  22mo ROV & she's had a mild exac w/ sl cough, sm amt beige sputum, & incr SOB w/ wheezing; they have restarted most of her prev meds including: PROAIR/ FLOVENT220- 2spbid, PRED10/d, THEODUR 300mg /d;  but they weaned the Pred10 to 1/2 tab daily;  We discussed adding Spiriva, incr Pred back to 10mg /d, continue Proair/ Flovent 2sp Bid, & add ZPak + MucinexDM +  Fluids etc... Reminded to bing all meds to each office visit>>  Asthma> as above; she has a mild AB exac & we will incr med rx as indicated; CXR today showed hyperinflation but clear lungs...  HBP> on Lisinopril10; BP=110/68 & she denies CP, palpit, syncope, edema; she is SOB due to the AB exac... BNP is normal at 61.  CBruit> on ASA 81; she denies cerebral ischemic symptoms...  DM> on RIOMET 500mg /64ml taking 1/2 tspBid; prev A1c=6.2... BS today= 134.  Hypothy> on Levothyroid78mcg- taking 1/2 daily... TSH today= 4.87  Primary Hyperpara> last 2 calcium readings have been wnl...  GERD/ Polyps> she has trouble swallowing big pills; eating soft foods ok; they note appetite fair & wt down 3# to 113#; asked to eat 3 meals/d & take Ensure between meals as supplement...  DJD/ Osteoporosis> on Fosamax weekly + MVI/ VitD1000u; ?family has been letting her take her own meds & I indicated that they nneded to dispense & control her meds for her...  Dementia/ Anxiety> on Zoloft50         Problem List:    ASTHMA (ICD-493.90) - she is a non-smoker> Prev on TheoDur 300mg /d, PROAIR HFA 2spBid &Prn, FLOVENT 220 2spBid;  She stopped all of her meds on her own in June2012 (?why)... ~  baseline CXR w/ hyperinflation, tort Ao, NAD... ~  4/12:  Notes some cough, congestion, intermittent wheezing> on Theo/ Proair/ Flovent;  Rec- Mucinex/ Fluids... ~  CXR 4/12 showed ectatic Ao, hyperinflation c/w emphysema, clear/ NAD.Marland Kitchen. ~  7/12:  We decided to treat w/ PRED 10mg  Bid (weaned to 10mg /d) + PROAIR prn wheezing... ~  12/12: she presented w/ a mild AB exac; family needs to control all her meds; we added ZPak, Spiriva, Mucinex, Fluids... ~  CXR 12/12 showed hyperinflation but clear, osteopenia, NAD...  HYPERTENSION (ICD-401.9) - back on LISINOPRIL 10mg /d; she had stopped all of her meds on her own June2012>  ~  cardiac cath 1991 was neg- norm coronaries (she may have had spasm). ~  labs 3/09 showed K=3.8, BUN=32,  Creat=1.8 ~  labs 6/10 showed K=4.4, BUN=46, Creat=2.2.Marland Kitchen. rec> stop Maxzide & KCl, ROV 68mo. ~  labs 7/10 showed K=4.6, BUN=58, Creat=1.6.Marland Kitchen. rec> incr fluid intake, stay off Maxzide. ~  5/11:  BP=138/72, tol Rx well, no problems; denies HA, visual changes, CP, palipit, syncope, ch in dyspnea, etc; notes tr edema intermittently. ~  4/12:  BP= 138/84 & she remains essentially asymptomatic; K=3.9, BUN=25, Creat=1.4 ~  7/12:  BP= 122/82 off meds but poor intake, weak, wt loss, etc; K=3.3, BUN=23, Creat=1.3> rec K10caps two daily. ~  8/12:  BP= 110/72 back on Lisinopril 10mg /d due to incr BP  at home; K=4.1 on 1tablesp KCL elixir daily. ~  12/12:  BP= 110/68 on Lisinopril monotherapy... Note: BNP= 61.  ABNORMAL ELECTROCARDIOGRAM (ICD-794.31) - EKG shows NSR, IVCD, LAD, poor R progression... ~  2DEcho 5/11 ==> ordered but pt never returned to have this done...  CAROTID BRUIT (ICD-785.9) - back on ASA 81mg /d> she denies cerebral ischemic symptoms... ~  CDopplers 5/11 ==> ordered but pt never returned to have this done... ~  CDopplers completed 5/12 & showed mild plaque in bulbs, 0-39% bilat ICA stenoses, antegrade vertebral flow.  DIABETES MELLITUS (ICD-250.00) - back on METFORMIN but taking RIOMET 500mg /66ml (1/2 tsp Bid) & appeared to tolerate well... ~  labs 1/08 showed BS=172, HgA1c=6.5 & Metformin 500mg /d started... ~  labs 3/09 showed BS= 99, A1c= 5.7 ~  labs 6/10 showed BS= 138, A1c= 6.0.Marland Kitchen. rec> same med. ~  labs 5/11 (wt=142#) showed BS= 141, A1c= 5.8 ~  Labs 4/12 (wt=129#) showed BS= 100, A1c= 5.9.Marland KitchenMarland Kitchen rec to decr Metformin to 1/2 tab each AM. ~  Labs 7/12 (wt=?<120#) & off all meds for 1+mo showed BS=77, A1c=6.2; rec continue MetformER- 1/2Qam (pt on Pred). ~  8/12:  Pt switched to Liq RIOMET 1/2 tsp Bid;  Labs showed BS= 116 ~  Labs 12/12 on Riomet-1/2tspBid showed BS= 134  HYPOTHYROIDISM (ICD-244.9) - prev on LEVOTHYROID 75 micrograms/d (w/ good energy & clinically euthyroid); she stopped  all meds on her own 6/12; we restarted LEVOTHYROID - 1/2 tab daily 7/12... ~  labs 1/08 showed TSH=1.84 on 75 micrograms/d... ~  labs 3/09 showed TSH= 5.05 ~  labs 6/10 showed TSH= 1.27 ~  labs 5/11 showed TSH= 0.64 ~  Labs 4/12 on Levo75 showed TSH= 1.61 ~  Labs 7/12 off meds showed TSH= 5.40 & rec to restart 1/2 of the tab daily. ~  Labs 8/12 on Levo75-1/2 showed TSH= 3.47 & rec to continue same. ~  Labs 12/12 on Levothy75-1/2 showed TSH= 4.87  ? of PRIMARY HYPERPARATHYROIDISM (ICD-252.01) - Ca++ levels in the 10.6-11.0 range... PO4- levels in the 2.1-2.4 range... PTH levels slowly rising 40-65... therefore following clinically w/ serial labs >> ~  labs 1/08 showed:  Ca++ 11.0, PTH= 42.4.Marland KitchenMarland KitchenMarland Kitchen ~  labs 3/09 showed Ca= 10.3, PO4= 3.2, PTH= 50 ~  labs 6/10 showed Ca= 10.9, PO4= 3.7, PTH= 65 ~  labs 5/11 showed Ca= 10.5-10.9, Phos= 3.0, PTH= 111... rec> continue to follow closely. ~  Labs 4/12 showed Ca= 11.0, PO4= not done, PTH= 80... NOT progressive- rec no calc supplements, incr fluids. ~  Labs 7/12 & 8/12 showed Ca= 9.9 ~  Labs 12/12 showed Ca= 10.2  GERD (ICD-530.81) - remote hx of PUD w/ GIB in the 80's... eval by Ashley Norman in the past... prev on Zantac and Reglan but hasn't filled these since 2009... denies nausea, vomiting, heartburn, diarrhea, constipation, blood in stool, abdominal pain, swelling, gas... she is rec to use OTC Prilosec vs Zantac Prn & avoid Tums etc... ~  7/12:  GI eval by Ashley Norman for adult FTT, wt loss & difficulty swallowing (?dysphagia for solids/pills/ liqs vs really just refusing to swallow)> Given Ashley Norman (pt wouldn't take it due to smell & taste), & did EGD> reported neg & dilatation performed...   COLONIC POLYPS (ICD-211.3) - there is a pos family history of colon cancer (in brother)... last colonoscopy was 5/98 [facility]) showing diminutive polyp (tubular adenoma)... f/u planned 3-5 yrs, but she never went... she is overdue and needs to sched this  important procedure but she has  repeatedly refused GI referral...  RENAL INSUFFICIENCY (ICD-588.9)  ~  labs 1/08 showed BUN= 21, Creat= 1.2 ~  labs 3/09 showed BUN= 32, Creat= 1.8 ~  labs 6/10 showed BUN= 46, Creat= 2.2.Marland KitchenMarland Kitchen rec to stop Maxzide (on 1/2 daily) ~  labs 7/10 showed BUN= 58, Creat= 1.6.Marland Kitchen. rec> incr fluid intake. ~  labs 5/11 showed BUN= 28, Creat= 1.5 ~  Labs 4/12 showed BUN=25, Creat=1.4 ~  Labs 7/12 showed BUN= 23, Creat= 1.3 ~  Labs 8/12 showed BUN= 23, Creat= 1.0 ~  Labs 12/12 showed BUN= 19, Creat= 1.1  HEADACHE (ICD-784.0)  DEGENERATIVE JOINT DISEASE (ICD-715.90) - mod DJD and walks w/ crutches "because of my ankles"... she's seen [facility] in the past... started on low dose Pred 6/10 for this & Asthma> improved & she weaned off the Pred on her own... ~  6/10: CC= "my feet" c/o arthritic pain in feet & ankles... rec> trial Pred 10mg /d w/ ROV 55mo. ~  7/10: pt states improved on the Pred which has been weaned... ~  7/12:  She is in wheelchair> can't vs won't stand  OSTEOPOROSIS (ICD-733.00) - currently taking ALENDRONATE 70mg /wk, no calcium supplement due to borderline hypercalcemia, MVI + Vit D 1000 u OTC supplement. ~  BMD 3/09 here showed TScores -2.9 in Spine, and -2.9 in right FemNeck... started on Alendronate 70mg /wk. ~  Vit D level 3/09 = 34... rec to start Vit D 1000 u OTC supplement. ~  Vit D level 6/10 = 50 ~  Vit D level 5/11 = 61 ~  5/12:  She needs f/u BMD ==> TScores showed -2.8 in Spine, and -2.7 in bilat FemNecks> Continue Alendronate, MVI, Vit D.  ANXIETY (ICD-300.00) >> she has a mild dementia as well w/ odd affect etc; on ZOLOFT 50mg /d & seems improved per family...  HEALTH MAINTENANCE: ~  GYN: she hasn't seen GYN (DrCollins) in yrs and needs PAP, Mammogram, etc (she has been reminded to set this up w/ each subseq visit here but declines to do so; asked to let us set this up for her but she declines our assistance). ~  GI:  last colonoscopy  1998 at [facility] by [facility]- sm adenoma removed; advised f/u w/ each subseq visit here but she declines GI follow up; referral made to [facility] for this important f/u procedure but she never went. ~  Immunizations:  had Pneumovax 2001... Tetanus (TDAP) given 5/11... she refuses the seasonal Flu vaccines & is reminded of the importance of these yearly vaccinations.   Past Surgical History  Procedure Date  . Tonsillectomy and adenoidectomy   . Appendectomy     Outpatient Encounter Prescriptions as of 09/26/2011  Medication Sig Dispense Refill  . albuterol (PROAIR HFA) 108 (90 BASE) MCG/ACT inhaler        . alendronate (FOSAMAX) 70 MG tablet TAKE 1 TABLET BY MOUTH EVERY WEEK  4 tablet  5  . aspirin 81 MG tablet Take 81 mg by mouth daily.        . Cholecalciferol (VITAMIN D) 1000 UNITS capsule Take 1,000 Units by mouth daily.        . ferrous sulfate 325 (65 FE) MG tablet Take 325 mg by mouth daily with breakfast.        . FLOVENT HFA 220 MCG/ACT inhaler inhale 2 puffs by mouth twice a day  12 g  0  . levothyroxine (LEVOTHROID) 75 MCG tablet 1/2 tablet daily      . lisinopril (PRINIVIL,ZESTRIL) 20 MG tablet  Take 10 mg by mouth daily.        . Metformin HCl (RIOMET) 500 MG/5ML SOLN Take 1/2 teaspoon twice daily       . Multiple Vitamin (MULTIVITAMIN PO) Take 1 tablet by mouth daily.        . potassium chloride 20 MEQ/15ML (10%) solution 1 tbs  by mouth every day  450 mL  5  . predniSONE (DELTASONE) 10 MG tablet 5 mg daily.       . predniSONE (DELTASONE) 10 MG tablet TAKE 1 TABLET BY MOUTH ONCE DAILY IN THE MORNING  30 tablet  3  . PROAIR HFA 108 (90 BASE) MCG/ACT inhaler INHALE 2 PUFFS BY MOUTH FOUR TIMES A DAY AS DIRECTED  8.5 g  4  . sertraline (ZOLOFT) 50 MG tablet Take 1 tablet (50 mg total) by mouth daily.  30 tablet  5  . theophylline (THEODUR) 300 MG 12 hr tablet TAKE 1 TABLET BY MOUTH DAILY  30 tablet  0    Allergies  Allergen Reactions  . Cephalexin     REACTION: rash, itching  .  Codeine     REACTION: rash, itching  . ZOX:[id number] Acid+Aspartame     REACTION: rash, itching    Current Medications, Allergies, Past Medical History, Past Surgical History, Family History, and Social History were reviewed in [employer] record.    Review of Systems         See HPI - all other systems neg except as noted... The patient complains of difficulty walking, otherw she is very stoic & denies all symptoms.  The patient denies anorexia, fever, weight loss, weight gain, vision loss, hoarseness, chest pain, syncope, peripheral edema, prolonged cough, headaches, hemoptysis, abdominal pain, melena, hematochezia, severe indigestion/heartburn, hematuria, incontinence, muscle weakness, suspicious skin lesions, transient blindness, depression, unusual weight change, abnormal bleeding, enlarged lymph nodes, and angioedema.     Objective:   Physical Exam     WD, Thin, [name], chronically ill appearing, but stronger & now able to ambulate w/ her canes... GENERAL:  Alert & sl confused, less agitated & more alert... HEENT:  [place]/AT, EOM-wnl, PERRLA, EACs-clear, TMs-wnl, NOSE-clear, THROAT-clear & wnl. NECK:  Supple w/ fairROM; no JVD; normal carotid impulses w/ 1+ bilat bruits; no thyromegaly or nodules palpated; no lymphadenopathy. CHEST:  decr BS bilat, clear to P & A; without wheezes or rales; + scat rhonchi heard...  HEART:  Regular Rhythm; +gr1-2 sys murmur, no rubs or gallops detected... ABDOMEN:  Soft & nontender; normal bowel sounds; no organomegaly or masses palpated... EXT: without deformities, mild arthritic changes; no varicose veins/ venous insuffic/ or edema. She has bilat canes but unable to stand unaided... NEURO:  CN's intact;  no focal neuro deficits, diffusely weak... DERM:  No lesions noted; no rash etc...  RADIOLOGY DATA:  Reviewed in the EPIC EMR & discussed w/ the patient...  LABORATORY DATA:  Reviewed in the EPIC EMR  & discussed w/ the patient...   Assessment & Plan:   ADULT FTT>  She is definitely better, slowly improving in all spheres; continue current meds, PT, nutrition, etc...  ASTHMA>  She has a component of fixed airflow obstruction from her yrs of RADS; she prev stopped all her meds (Pred, Theophyllin, Proair, Flovent); we prev bargained for PRED 10mg  Qd & PROAIR Q4H prn> then they gradually restarted all her other meds in the interim; now w/ a mild exac & we discussed the need to incr Pred back to 10mg /d, Add  Spiriva, continue Symbicort, Proair, etc...  HBP>  Controlled back on her ACE & we will continue to monitor her BP as her intake (hopefully) improves; K= 4.1 on the elixir 1 tablesp daily...  Abn EKG/ CBruit>  She never went for prev 2DEcho/ CDopplers (ordered 5/11) & seemed oblivious to recommendations; discussed w/ pt> stable on ASA 81mg /d & not interested in aggressive eval or treatments; She finally had CDopplers done 5/12 & they were OK= mild plaque & 0-39% ICA stenoses> rec continue ASA.  DM>  Her wt today= 113# & she looks better, eating better per family; BS=134  on the Pred 5mg /d now & the RIOMET 500mg /62ml taking 1/2 tsp bid, continue same...  Hypothy>  Prev stable on Synth45mcg dose & TSH 4/12 was 1.61; TSH today off meds = 5.40 & she restarted 1/2 of the tab daily; f/u TSH 12/12 = 4.87 & stable, continue same...  Hyperpara>  Prev Ca up to 11 and PTH= 80 & not progressive, therefore continue to watch carefully & REC no calc supplements etc; Ca= 10.2 currently & stable...  Renal insuffic>  Creat has improved w/ better intake to 1.0 today...  DJD/ Osteoporosis>  Mod severe DJD & copes well, due for f/u BMD on the Fosamax ==> sl improved, continue same.  DEPRESSION/ ?Psychiatric illness/ Adult FTT/ Weakness> Change in personality, mental status, mood, family relations, etc over the past several months; we discussed plan that REQUIRES her to take meds as outlined, take in adeq  nutrition & Glucerna supplements, & drink fluids... On Zoloft 50mg /d & slowly improved...  Other medical issues as noted >> SED rate is elev at 67 ==> 22 on the Pred etc...   Patient's Medications  New Prescriptions   AZITHROMYCIN (ZITHROMAX) 250 MG TABLET    Take 2 tablets (500 mg) on  Day 1,  followed by 1 tablet (250 mg) once daily on Days 2 through 5.   TIOTROPIUM (SPIRIVA HANDIHALER) 18 MCG INHALATION CAPSULE    Place 1 capsule (18 mcg total) into inhaler and inhale daily.  Previous Medications   ALENDRONATE (FOSAMAX) 70 MG TABLET    TAKE 1 TABLET BY MOUTH EVERY WEEK   ASPIRIN 81 MG TABLET    Take 81 mg by mouth daily.     CHOLECALCIFEROL (VITAMIN D) 1000 UNITS CAPSULE    Take 1,000 Units by mouth daily.     FERROUS SULFATE 325 (65 FE) MG TABLET    Take 325 mg by mouth daily with breakfast.     LEVOTHYROXINE (LEVOTHROID) 75 MCG TABLET    1/2 tablet daily   LISINOPRIL (PRINIVIL,ZESTRIL) 20 MG TABLET    Take 10 mg by mouth daily.     METFORMIN HCL (RIOMET) 500 MG/5ML SOLN    Take 1/2 teaspoon twice daily    MULTIPLE VITAMIN (MULTIVITAMIN PO)    Take 1 tablet by mouth daily.     POTASSIUM CHLORIDE 20 MEQ/15ML (10%) SOLUTION    1 tbs  by mouth every day   PREDNISONE (DELTASONE) 10 MG TABLET    Take 10 mg by mouth daily.    SERTRALINE (ZOLOFT) 50 MG TABLET    Take 1 tablet (50 mg total) by mouth daily.   THEOPHYLLINE (THEODUR) 300 MG 12 HR TABLET    TAKE 1 TABLET BY MOUTH DAILY  Modified Medications   Modified Medication Previous Medication   ALBUTEROL (PROAIR HFA) 108 (90 BASE) MCG/ACT INHALER PROAIR HFA 108 (90 BASE) MCG/ACT inhaler      Inhale 2  puffs into the lungs every 6 (six) hours as needed for wheezing.    INHALE 2 PUFFS BY MOUTH FOUR TIMES A DAY AS DIRECTED   FLUTICASONE (FLOVENT HFA) 220 MCG/ACT INHALER FLOVENT HFA 220 MCG/ACT inhaler      Inhale 2 puffs into the lungs 2 (two) times daily.    inhale 2 puffs by mouth twice a day  Discontinued Medications

## 2011-10-27 ENCOUNTER — Telehealth: Payer: Self-pay | Admitting: Pulmonary Disease

## 2011-10-27 MED ORDER — LEVOFLOXACIN 500 MG PO TABS: 500.0000 mg | ORAL_TABLET | Freq: Every day | ORAL | Status: AC

## 2011-10-27 NOTE — Telephone Encounter (Signed)
Spoke with pt's daughter Lupita Leash. She states that the pt has had cough x 3 days- prod for the past 2 days with large amounts of green to yellow sputum. She states no fever or changes in her breathing. Would like something called in. Please advise, thanks! Allergies  Allergen Reactions  . Cephalexin     REACTION: rash, itching  . Codeine     REACTION: rash, itching  . JXB:[id number] Acid+Aspartame     REACTION: rash, itching

## 2011-10-27 NOTE — Telephone Encounter (Signed)
Per SN-give patient Levaquin 500mg  #7 take 1 po qd no refills and use Mucinex OTC BID, and increased fluids. Pt is aware that Rx's have been sent to pharmacy-Jennifer called the patient for me.

## 2011-11-28 ENCOUNTER — Encounter: Payer: Self-pay | Admitting: Pulmonary Disease

## 2011-11-28 ENCOUNTER — Ambulatory Visit (INDEPENDENT_AMBULATORY_CARE_PROVIDER_SITE_OTHER): Payer: TRICARE For Life (TFL) | Admitting: Pulmonary Disease

## 2011-11-28 DIAGNOSIS — K219 Gastro-esophageal reflux disease without esophagitis: Secondary | ICD-10-CM

## 2011-11-28 DIAGNOSIS — R627 Adult failure to thrive: Secondary | ICD-10-CM

## 2011-11-28 DIAGNOSIS — F32A Depression, unspecified: Secondary | ICD-10-CM

## 2011-11-28 DIAGNOSIS — I10 Essential (primary) hypertension: Secondary | ICD-10-CM

## 2011-11-28 DIAGNOSIS — D126 Benign neoplasm of colon, unspecified: Secondary | ICD-10-CM

## 2011-11-28 DIAGNOSIS — M1990 Unspecified osteoarthritis, unspecified site: Secondary | ICD-10-CM

## 2011-11-28 DIAGNOSIS — F329 Major depressive disorder, single episode, unspecified: Secondary | ICD-10-CM

## 2011-11-28 DIAGNOSIS — E039 Hypothyroidism, unspecified: Secondary | ICD-10-CM

## 2011-11-28 DIAGNOSIS — F411 Generalized anxiety disorder: Secondary | ICD-10-CM

## 2011-11-28 DIAGNOSIS — J45909 Unspecified asthma, uncomplicated: Secondary | ICD-10-CM

## 2011-11-28 DIAGNOSIS — M810 Age-related osteoporosis without current pathological fracture: Secondary | ICD-10-CM

## 2011-11-28 DIAGNOSIS — E119 Type 2 diabetes mellitus without complications: Secondary | ICD-10-CM

## 2011-11-28 DIAGNOSIS — E213 Hyperparathyroidism, unspecified: Secondary | ICD-10-CM

## 2011-11-28 MED ORDER — MOMETASONE FURO-FORMOTEROL FUM 200-5 MCG/ACT IN AERO: 2.0000 | INHALATION_SPRAY | Freq: Two times a day (BID) | RESPIRATORY_TRACT | Status: DC

## 2011-11-28 MED ORDER — MEGESTROL ACETATE 40 MG/ML PO SUSP: ORAL | Status: DC

## 2011-11-28 NOTE — Patient Instructions (Addendum)
Today we updated your med list in our EPIC system...    We decided to change the Flovent to [facility]- 2 sprays twice daily...    And use the PROAIR as a rescue inhaler as needed if tight & wheezing...  We wrote a new prescription for MEGACE to take 1 tsp w/ each meal for appetite...    Don't forget the ALIGN probiotic one daily for her bowels (& the Activia yogurt if she likes it)...   Encourage oral intake & nutritional support w/ the Ensure etc...  Call for any problems...  Let's plan a recheck in 2-3 months & we will repeat her blood work at that time.Marland KitchenMarland Kitchen

## 2011-11-29 ENCOUNTER — Encounter: Payer: Self-pay | Admitting: Pulmonary Disease

## 2011-11-29 NOTE — Progress Notes (Signed)
Subjective:    Patient ID: Ashley Norman, female    DOB: 07-04-1928, 78 y.o.   MRN: [id number]  HPI 78 y/o WF here for a follow up visit... she has mult med problems as listed below...   ~  January 24, 2011:  Yearly ROV "just getting older" she says;  She weaned off her Pred & hasn't taken this in months, breathing fair- some congestion, wheezing, cough... BP controlled;  Denies CP, plapit, cerebral ischemic symptoms;  She never did the CDopplers as requested last yr "I'm getting forgetful" she says;  Not checking sugars at home (last A1c= 5.8 to 6.0);  Due for f/u thyroid & parathyroid blood work as well... We will sched the CDopplers (neg w/ 0-39% bilat ICAstenoses) and f/u BMD (TScores -2.8 Spine, & -2.7 FemNecks) at her convenience...  ~  April 21, 2011:  32mo ROV & add on at family request> She has hx of Asthma/COPD, HBP, DM, Hypothyroid, Hyperparathyroid, Renal Insuffic, DJD, Osteoporosis> they relate a bizarre story going back over 4-6wks of confusion, very stubborn, refusing to take meds/ eat much/ or drink fluids; sounding depressed & some of her conversation w/ daughter had vailed suicide threats; family didn't seek med attention/ ER/ Psyche & tried to encourage her, changed her venue, & accommodate her in any way they could> but all to no avail...  They present today after an ordeal getting her into the office (took 1-2H to get her dressed & into the car, then wouldn't get out at the office); all Ashley Norman can say is "can I leave yet", her biggest concern being that we will put her in the hosp which she refuses;  Exam reveals VS- ok, Chest- clear, gr 1/6 SEM, thin abd- nontender, diffusely weak, not making good sense, delay relaxation phase of DTRs;  They are conflicted & not sure which way to go> I suggested HOSP for full eval & Psychiatric consultation, but they prefer to try outpt RX if poss> we discussed an agreement= she will take meds, eat some, & drink Glucerna & fluids; and we will check  labs & try to keep her out of the hosp if poss...    Family brought her in to see DrDBrodie w/ adult FTT, wt loss & difficulty swallowing (?dysphagia for solids, pills, & liqs vs really just refusing to swallow)> Given Megace (pt wouldn't take it due to smell & taste), & did EGD> reported neg & dilatation performed...   ~  April 28, 2011:  1wk ROV & the promise was that she would coop w/ eating, drinking, taking meds & we would endeavor to keep her out of the hosp> family reports that she is somewhat improved- taking meds, eating, drinking, etc; but difficulty w/ big pills & wants Liq for Metformin (give RIOMET) & KCl elixir;  She is making it to the bathroom & they use depends prn (they do not want BSC);  Resting ok, bowel movements ok; on Zoloft 25mg  & seems to be helping...    We discussed incr Zoloft to 50mg /d, order home PT/ OT, she is asked to continue the better intake, etc...  ~  May 16, 2011:  2wk ROV & she says "I feel OK, can I leave now"; she continues to improve> seems sl more cooperative & interactive, home PT helping some per family; apparently no pain, no dyspnea, ambulating w/ her canes better;  They called w/ incr BP at home per PT nurse ~170/90 so we restarted her Lisinopril 10mg /d & her  BP is better 110/72 today & she denies CP, palpit, syncope, edema, etc...  Still taking Zoloft 50mg /d & they note good days & bad...    LABS today> Chems wnl, BS=116 on her RIOMET 1/2 tsp Bid, K=4.1 on KCl elixir 1T in juice daily; CBC wnl w/ Hg= 13.2; TSH norm on 1/2 of the daily; & Sed=22 now on the Pred 10mg /d...  ~  September 26, 2011:  20mo ROV & she's had a mild exac w/ sl cough, sm amt beige sputum, & incr SOB w/ wheezing; they have restarted most of her prev meds including: PROAIR/ FLOVENT220- 2spbid, PRED10/d, THEODUR 300mg /d;  but they weaned the Pred10 to 1/2 tab daily;  We discussed adding Spiriva, incr Pred back to 10mg /d, continue Proair/ Flovent 2sp Bid, & add ZPak + MucinexDM +  Fluids etc... Reminded to bing all meds to each office visit>>  Asthma> as above; she has a mild AB exac & we will incr med rx as indicated; CXR today showed hyperinflation but clear lungs...  HBP> on Lisinopril10; BP=110/68 & she denies CP, palpit, syncope, edema; she is SOB due to the AB exac... BNP is normal at 61.  CBruit> on ASA 81; she denies cerebral ischemic symptoms...  DM> on RIOMET 500mg /32ml taking 1/2 tspBid; prev A1c=6.2... BS today= 134.  Hypothy> on Levothyroid52mcg- taking 1/2 daily... TSH today= 4.87  Primary Hyperpara> last 2 calcium readings have been wnl...  GERD/ Polyps> she has trouble swallowing big pills; eating soft foods ok; they note appetite fair & wt down 3# to 113#; asked to eat 3 meals/d & take Ensure between meals as supplement...  DJD/ Osteoporosis> on Fosamax weekly + MVI/ VitD1000u; ?family has been letting her take her own meds & I indicated that they nneded to dispense & control her meds for her...  Dementia/ Anxiety> on Zoloft50  ~  November 28, 2011:  73mo ROV & Ashley Norman has really "fallen off" not eating well per family, trying to take Ensure but obviously not enough her wt is down 10# to 103# today;  We discussed restarting MEGACE for appetite & careful supervision of meals for encouragement, food prefs, etc and continue the supplements;  She is still very negative & contrary> clearly not exercising or even moving much & appears to have a good bit of musc atrophy> she refuses formal PT & wouldn't allow home therapy in the past; says she knows what to do just doesn't want to do it & want to be left alone to sleep as much as she can; on Zoloft but still asking for DrKavorkian; offered Psychiatric referral but they decline & prefer to continue conservative supportive therapy & comfort measures...    Last OV we added Spiriva + Pred 10mg /d and Mucinex 1-2Bid; she has had monthly antibiotic calls w/ ZPak the Levaquin called in for bronchitic exac; chest remains sl  congested w/ rhonchi bilat & they are encouraged to use the Mucinex 2Bid + Fluids and we decided to change the Flovent/ Proair to DULERA200- 2spBid + Proair rescue..    BP is low at 94/60 w/ her wt loss on Lisinopril 10mg /d; asked to monitor BP at home & hold Lisinopril if BP<110 sys... Prev LABS & XRays are reviewed...         Problem List:    ADULT FTT >> see above  ASTHMA (ICD-493.90) - she is a non-smoker> Prev on TheoDur 300mg /d, PROAIR HFA 2spBid &Prn, FLOVENT 220 2spBid;  She stopped all of her meds on her  own in June2012 (?why); then we restarted her Rx as below>> ~  baseline CXR w/ hyperinflation, tort Ao, NAD... ~  4/12:  Notes some cough, congestion, intermittent wheezing> on Theo/ Proair/ Flovent;  Rec- Mucinex/ Fluids... ~  CXR 4/12 showed ectatic Ao, hyperinflation c/w emphysema, clear/ NAD.Marland Kitchen. ~  7/12:  We decided to treat w/ PRED 10mg  Bid (weaned to 10mg /d) + PROAIR prn wheezing... ~  12/12: she presented w/ a mild AB exac; family needs to control all her meds; we added ZPak, Spiriva, Mucinex, Fluids... ~  CXR 12/12 showed hyperinflation but clear, osteopenia, NAD... ~  3/13:  We adjusted meds to DULERA200-2spBid, SPIRIVA-once daily, PRED 10mg /d, THEODUR 300mg /d, PROAIR-prn, & MUCINEX-2Bid w/ fluids...  HYPERTENSION (ICD-401.9) - back on LISINOPRIL 10mg /d; she had stopped all of her meds on her own June2012>  ~  cardiac cath 1991 was neg- norm coronaries (she may have had spasm). ~  labs 3/09 showed K=3.8, BUN=32, Creat=1.8 ~  labs 6/10 showed K=4.4, BUN=46, Creat=2.2.Marland Kitchen. rec> stop Maxzide & KCl, ROV 3mo. ~  labs 7/10 showed K=4.6, BUN=58, Creat=1.6.Marland Kitchen. rec> incr fluid intake, stay off Maxzide. ~  5/11:  BP=138/72, tol Rx well, no problems; denies HA, visual changes, CP, palipit, syncope, ch in dyspnea, etc; notes tr edema intermittently. ~  4/12:  BP= 138/84 & she remains essentially asymptomatic; K=3.9, BUN=25, Creat=1.4 ~  7/12:  BP= 122/82 off meds but poor intake, weak, wt  loss, etc; K=3.3, BUN=23, Creat=1.3> rec K10caps two daily. ~  8/12:  BP= 110/72 back on Lisinopril 10mg /d due to incr BP at home; K=4.1 on 1tablesp KCL elixir daily. ~  12/12:  BP= 110/68 on Lisinopril monotherapy... Note: BNP= 61. ~  3/13:  BP= 94/60 on Lisin10 & asked to monitor BP at home & hold if BP<110 sys...  ABNORMAL ELECTROCARDIOGRAM (ICD-794.31) - EKG shows NSR, IVCD, LAD, poor R progression... ~  2DEcho 5/11 ==> ordered but pt never returned to have this done...  CAROTID BRUIT (ICD-785.9) - back on ASA 81mg /d> she denies cerebral ischemic symptoms... ~  CDopplers 5/11 ==> ordered but pt never returned to have this done... ~  CDopplers completed 5/12 & showed mild plaque in bulbs, 0-39% bilat ICA stenoses, antegrade vertebral flow.  DIABETES MELLITUS (ICD-250.00) - back on METFORMIN but taking RIOMET 500mg /49ml (1/2 tsp Bid) & appeared to tolerate well... ~  labs 1/08 showed BS=172, HgA1c=6.5 & Metformin 500mg /d started... ~  labs 3/09 showed BS= 99, A1c= 5.7 ~  labs 6/10 showed BS= 138, A1c= 6.0.Marland Kitchen. rec> same med. ~  labs 5/11 (wt=142#) showed BS= 141, A1c= 5.8 ~  Labs 4/12 (wt=129#) showed BS= 100, A1c= 5.9.Marland KitchenMarland Kitchen rec to decr Metformin to 1/2 tab each AM. ~  Labs 7/12 (wt=?<120#) & off all meds for 1+mo showed BS=77, A1c=6.2; rec continue MetformER- 1/2Qam (pt on Pred). ~  8/12:  Pt switched to Liq RIOMET 1/2 tsp Bid;  Labs showed BS= 116 ~  Labs 12/12 on Riomet-1/2tspBid showed BS= 134  HYPOTHYROIDISM (ICD-244.9) - prev on LEVOTHYROID 75 micrograms/d (w/ good energy & clinically euthyroid); she stopped all meds on her own 6/12; we restarted LEVOTHYROID - 1/2 tab daily 7/12... ~  labs 1/08 showed TSH=1.84 on 75 micrograms/d... ~  labs 3/09 showed TSH= 5.05 ~  labs 6/10 showed TSH= 1.27 ~  labs 5/11 showed TSH= 0.64 ~  Labs 4/12 on Levo75 showed TSH= 1.61 ~  Labs 7/12 off meds showed TSH= 5.40 & rec to restart 1/2 of the tab daily. ~  Labs 8/12 on Levo75-1/2 showed TSH=  3.47 & rec to continue same. ~  Labs 12/12 on Levothy75-1/2 showed TSH= 4.87  ? of PRIMARY HYPERPARATHYROIDISM (ICD-252.01) - Ca++ levels in the 10.6-11.0 range... PO4- levels in the 2.1-2.4 range... PTH levels slowly rising 40-65... therefore following clinically w/ serial labs >> ~  labs 1/08 showed:  Ca++ 11.0, PTH= 42.4.Marland KitchenMarland KitchenMarland Kitchen ~  labs 3/09 showed Ca= 10.3, PO4= 3.2, PTH= 50 ~  labs 6/10 showed Ca= 10.9, PO4= 3.7, PTH= 65 ~  labs 5/11 showed Ca= 10.5-10.9, Phos= 3.0, PTH= 111... rec> continue to follow closely. ~  Labs 4/12 showed Ca= 11.0, PO4= not done, PTH= 80... NOT progressive- rec no calc supplements, incr fluids. ~  Labs 7/12 & 8/12 showed Ca= 9.9 ~  Labs 12/12 showed Ca= 10.2  GERD (ICD-530.81) - remote hx of PUD w/ GIB in the 80's... eval by DrDBrodie in the past... prev on Zantac and Reglan but hasn't filled these since 2009... denies nausea, vomiting, heartburn, diarrhea, constipation, blood in stool, abdominal pain, swelling, gas... she is rec to use OTC Prilosec vs Zantac Prn & avoid Tums etc... ~  7/12:  GI eval by DrDBrodie for adult FTT, wt loss & difficulty swallowing (?dysphagia for solids/pills/ liqs vs really just refusing to swallow)> Given Megace (pt wouldn't take it due to smell & taste), & did EGD> reported neg & dilatation performed...   COLONIC POLYPS (ICD-211.3) - there is a pos family history of colon cancer (in brother)... last colonoscopy was 5/98 [facility]) showing diminutive polyp (tubular adenoma)... f/u planned 3-5 yrs, but she never went... she is overdue and needs to sched this important procedure but she has repeatedly refused GI referral...  RENAL INSUFFICIENCY (ICD-588.9)  ~  labs 1/08 showed BUN= 21, Creat= 1.2 ~  labs 3/09 showed BUN= 32, Creat= 1.8 ~  labs 6/10 showed BUN= 46, Creat= 2.2.Marland KitchenMarland Kitchen rec to stop Maxzide (on 1/2 daily) ~  labs 7/10 showed BUN= 58, Creat= 1.6.Marland Kitchen. rec> incr fluid intake. ~  labs 5/11 showed BUN= 28, Creat= 1.5 ~  Labs 4/12 showed  BUN=25, Creat=1.4 ~  Labs 7/12 showed BUN= 23, Creat= 1.3 ~  Labs 8/12 showed BUN= 23, Creat= 1.0 ~  Labs 12/12 showed BUN= 19, Creat= 1.1  HEADACHE (ICD-784.0)  DEGENERATIVE JOINT DISEASE (ICD-715.90) - mod DJD and walks w/ crutches "because of my ankles"... she's seen [facility] in the past... started on low dose Pred 6/10 for this & Asthma> improved & she weaned off the Pred on her own... ~  6/10: CC= "my feet" c/o arthritic pain in feet & ankles... rec> trial Pred 10mg /d w/ ROV 54mo. ~  7/10: pt states improved on the Pred which has been weaned... ~  7/12:  She is in wheelchair> can't vs won't stand  OSTEOPOROSIS (ICD-733.00) - currently taking ALENDRONATE 70mg /wk, no calcium supplement due to borderline hypercalcemia, MVI + Vit D 1000 u OTC supplement. ~  BMD 3/09 here showed TScores -2.9 in Spine, and -2.9 in right FemNeck... started on Alendronate 70mg /wk. ~  Vit D level 3/09 = 34... rec to start Vit D 1000 u OTC supplement. ~  Vit D level 6/10 = 50 ~  Vit D level 5/11 = 61 ~  5/12:  She needs f/u BMD ==> TScores showed -2.8 in Spine, and -2.7 in bilat FemNecks> Continue Alendronate, MVI, Vit D.  ANXIETY (ICD-300.00) >> she has a mild dementia as well w/ odd affect etc; on ZOLOFT 50mg /d & seems improved per family.Marland KitchenMarland Kitchen  HEALTH MAINTENANCE: ~  GYN: she hasn't seen GYN (DrCollins) in yrs and needs PAP, Mammogram, etc (she has been reminded to set this up w/ each subseq visit here but declines to do so; asked to let us set this up for her but she declines our assistance). ~  GI:  last colonoscopy 1998 at [facility] by [facility]- sm adenoma removed; advised f/u w/ each subseq visit here but she declines GI follow up; referral made to [facility] for this important f/u procedure but she never went. ~  Immunizations:  had Pneumovax 2001... Tetanus (TDAP) given 5/11... she refuses the seasonal Flu vaccines & is reminded of the importance of these yearly vaccinations.   Past Surgical History    Procedure Date  . Tonsillectomy and adenoidectomy   . Appendectomy     Outpatient Encounter Prescriptions as of 11/28/2011  Medication Sig Dispense Refill  . albuterol (PROAIR HFA) 108 (90 BASE) MCG/ACT inhaler Inhale 2 puffs into the lungs every 6 (six) hours as needed for wheezing.  8.5 g  6  . alendronate (FOSAMAX) 70 MG tablet TAKE 1 TABLET BY MOUTH EVERY WEEK  4 tablet  5  . aspirin 81 MG tablet Take 81 mg by mouth daily.        . Cholecalciferol (VITAMIN D) 1000 UNITS capsule Take 1,000 Units by mouth daily.        . ferrous sulfate 325 (65 FE) MG tablet Take 325 mg by mouth daily with breakfast.        . levothyroxine (LEVOTHROID) 75 MCG tablet 1/2 tablet daily      . lisinopril (PRINIVIL,ZESTRIL) 20 MG tablet Take 10 mg by mouth daily.        . Metformin HCl (RIOMET) 500 MG/5ML SOLN Take 1/2 teaspoon twice daily       . Multiple Vitamin (MULTIVITAMIN PO) Take 1 tablet by mouth daily.        . potassium chloride 20 MEQ/15ML (10%) solution 1 tbs  by mouth every day  450 mL  5  . predniSONE (DELTASONE) 10 MG tablet Take 10 mg by mouth daily.       . sertraline (ZOLOFT) 50 MG tablet Take 1 tablet (50 mg total) by mouth daily.  30 tablet  5  . theophylline (THEODUR) 300 MG 12 hr tablet TAKE 1 TABLET BY MOUTH DAILY  30 tablet  0  . tiotropium (SPIRIVA HANDIHALER) 18 MCG inhalation capsule Place 1 capsule (18 mcg total) into inhaler and inhale daily.  30 capsule  11  . DISCONTD: fluticasone (FLOVENT HFA) 220 MCG/ACT inhaler Inhale 2 puffs into the lungs 2 (two) times daily.  12 g  6  . megestrol (MEGACE ORAL) 40 MG/ML suspension Take 1 teaspoon by mouth three times daily with meals  240 mL  prn  . Mometasone Furo-Formoterol Fum (DULERA) 200-5 MCG/ACT AERO Inhale 2 puffs into the lungs 2 (two) times daily.  1 Inhaler  11  . DISCONTD: lisinopril (PRINIVIL,ZESTRIL) 20 MG tablet take 1 tablet by mouth once daily  30 tablet  6    Allergies  Allergen Reactions  . Cephalexin     REACTION:  rash, itching  . Codeine     REACTION: rash, itching  . XBJ:[id number]+QMVHQIONG+[id number] Acid+Aspartame     REACTION: rash, itching    Current Medications, Allergies, Past Medical History, Past Surgical History, Family History, and Social History were reviewed in [employer] record.    Review of Systems  See HPI - all other systems neg except as noted... The patient complains of difficulty walking, otherw she is very stoic & denies all symptoms.  The patient denies anorexia, fever, weight loss, weight gain, vision loss, hoarseness, chest pain, syncope, peripheral edema, prolonged cough, headaches, hemoptysis, abdominal pain, melena, hematochezia, severe indigestion/heartburn, hematuria, incontinence, muscle weakness, suspicious skin lesions, transient blindness, depression, unusual weight change, abnormal bleeding, enlarged lymph nodes, and angioedema.     Objective:   Physical Exam     WD, Thin, 78 y/o WF, chronically ill appearing, now weaker, in wheelchair, difficulty standing unaided, & cachectic w/o focal localizing symptoms... GENERAL:  Alert & sl confused, less agitated & more alert... HEENT:  [place]/AT, EOM-wnl, PERRLA, EACs-clear, TMs-wnl, NOSE-clear, THROAT-clear & wnl. NECK:  Supple w/ fairROM; no JVD; normal carotid impulses w/ 1+ bilat bruits; no thyromegaly or nodules palpated; no lymphadenopathy. CHEST:  decr BS bilat, clear to P & A; without wheezes or rales; + scat rhonchi heard...  HEART:  Regular Rhythm; +gr1-2 sys murmur, no rubs or gallops detected... ABDOMEN:  Soft & nontender; normal bowel sounds; no organomegaly or masses palpated... EXT: without deformities, mild arthritic changes; no varicose veins/ venous insuffic/ or edema. She has bilat canes but unable to stand unaided... NEURO:  CN's intact;  no focal neuro deficits, diffusely weak... DERM:  No lesions noted; no rash etc...  RADIOLOGY DATA:  Reviewed in the EPIC EMR &  discussed w/ the patient...  LABORATORY DATA:  Reviewed in the EPIC EMR & discussed w/ the patient...   Assessment & Plan:   ADULT FTT>  She has taken a dramatic turn for the worse, lost 10#, just not eating, doesn't care, family doesn't know what to do but everyone is inclined towards conservative approach, comfort care, honoring her wishes to be left alone, etc... We have rec restarting MEGACE for appetite, monitor all meals & encourage, continue Ensure etc, encourage exercise program at home etc...  ASTHMA>  She has a component of fixed airflow obstruction from her yrs of RADS; she prev stopped all her meds (Pred, Theophyllin, Proair, Flovent); we prev bargained for PRED 10mg  Qd & PROAIR Q4H prn> then they gradually restarted all her other meds in the interim; now w/ adjustment in meds to include Dulera200-2spBid, Spiriva daily, Pred10mg /d, Mucinex-2Bid, fluids, etc; we will try to wean down the Theodur...  HBP>  Controlled back on her ACE & we will continue to monitor her BP as her intake (hopefully) improves; K= 4.1 on the elixir 1 tablesp daily...  Abn EKG/ CBruit>  She never went for prev 2DEcho/ CDopplers (ordered 5/11) & seemed oblivious to recommendations; discussed w/ pt> stable on ASA 81mg /d & not interested in aggressive eval or treatments; She finally had CDopplers done 5/12 & they were OK= mild plaque & 0-39% ICA stenoses> rec continue ASA.  DM>  Her wt today= 113# & she looks better, eating better per family; BS=134  on the Pred 5mg /d now & the RIOMET 500mg /35ml taking 1/2 tsp bid, continue same...  Hypothy>  Prev stable on Synth81mcg dose & TSH 4/12 was 1.61; TSH today off meds = 5.40 & she restarted 1/2 of the tab daily; f/u TSH 12/12 = 4.87 & stable, continue same...  Hyperpara>  Prev Ca up to 11 and PTH= 80 & not progressive, therefore continue to watch carefully & REC no calc supplements etc; Ca= 10.2 currently & stable...  Renal insuffic>  Creat has improved w/ better  intake to 1.0 today.Marland KitchenMarland Kitchen  DJD/ Osteoporosis>  Mod severe DJD & copes well, due for f/u BMD on the Fosamax ==> sl improved, continue same.  DEPRESSION/ ?Psychiatric illness/ Adult FTT/ Weakness> Change in personality, mental status, mood, family relations, etc over the past several months; we discussed plan that REQUIRES her to take meds as outlined, take in adeq nutrition & Glucerna supplements, & drink fluids... On Zoloft 50mg /d & slowly improved...  Other medical issues as noted >> SED rate is elev at 67 ==> 22 on the Pred etc...   Patient's Medications  New Prescriptions   MEGESTROL (MEGACE ORAL) 40 MG/ML SUSPENSION    Take 1 teaspoon by mouth three times daily with meals   MOMETASONE FURO-FORMOTEROL FUM (DULERA) 200-5 MCG/ACT AERO    Inhale 2 puffs into the lungs 2 (two) times daily.  Previous Medications   ALBUTEROL (PROAIR HFA) 108 (90 BASE) MCG/ACT INHALER    Inhale 2 puffs into the lungs every 6 (six) hours as needed for wheezing.   ALENDRONATE (FOSAMAX) 70 MG TABLET    TAKE 1 TABLET BY MOUTH EVERY WEEK   ASPIRIN 81 MG TABLET    Take 81 mg by mouth daily.     CHOLECALCIFEROL (VITAMIN D) 1000 UNITS CAPSULE    Take 1,000 Units by mouth daily.     FERROUS SULFATE 325 (65 FE) MG TABLET    Take 325 mg by mouth daily with breakfast.     LEVOTHYROXINE (LEVOTHROID) 75 MCG TABLET    1/2 tablet daily   LISINOPRIL (PRINIVIL,ZESTRIL) 20 MG TABLET    Take 10 mg by mouth daily.     METFORMIN HCL (RIOMET) 500 MG/5ML SOLN    Take 1/2 teaspoon twice daily    MULTIPLE VITAMIN (MULTIVITAMIN PO)    Take 1 tablet by mouth daily.     POTASSIUM CHLORIDE 20 MEQ/15ML (10%) SOLUTION    1 tbs  by mouth every day   PREDNISONE (DELTASONE) 10 MG TABLET    Take 10 mg by mouth daily.    SERTRALINE (ZOLOFT) 50 MG TABLET    Take 1 tablet (50 mg total) by mouth daily.   THEOPHYLLINE (THEODUR) 300 MG 12 HR TABLET    TAKE 1 TABLET BY MOUTH DAILY   TIOTROPIUM (SPIRIVA HANDIHALER) 18 MCG INHALATION CAPSULE    Place 1  capsule (18 mcg total) into inhaler and inhale daily.  Modified Medications   No medications on file  Discontinued Medications   FLUTICASONE (FLOVENT HFA) 220 MCG/ACT INHALER    Inhale 2 puffs into the lungs 2 (two) times daily.   LISINOPRIL (PRINIVIL,ZESTRIL) 20 MG TABLET    take 1 tablet by mouth once daily

## 2011-12-26 ENCOUNTER — Other Ambulatory Visit: Payer: Self-pay | Admitting: Pulmonary Disease

## 2011-12-26 DIAGNOSIS — M25531 Pain in right wrist: Secondary | ICD-10-CM

## 2012-01-23 ENCOUNTER — Other Ambulatory Visit: Payer: Self-pay | Admitting: Pulmonary Disease

## 2012-01-31 ENCOUNTER — Ambulatory Visit: Payer: Medicare Other | Admitting: Pulmonary Disease

## 2012-03-19 ENCOUNTER — Telehealth: Payer: Self-pay | Admitting: Pulmonary Disease

## 2012-03-19 ENCOUNTER — Ambulatory Visit: Payer: Medicare Other | Admitting: Pulmonary Disease

## 2012-03-19 NOTE — Telephone Encounter (Signed)
SN has no other aval. appts this week.  Ok to offer July 3 at 3:30.  Called and lmom for Ashley Norman.

## 2012-03-19 NOTE — Telephone Encounter (Signed)
Spoke with Ashley Norman and appt has been rescheduled for the pt for July 22 at 3pm.  Nothing further needed.

## 2012-03-21 ENCOUNTER — Other Ambulatory Visit: Payer: Self-pay | Admitting: Pulmonary Disease

## 2012-04-16 ENCOUNTER — Ambulatory Visit (INDEPENDENT_AMBULATORY_CARE_PROVIDER_SITE_OTHER): Payer: Medicare Other | Admitting: Pulmonary Disease

## 2012-04-16 ENCOUNTER — Other Ambulatory Visit (INDEPENDENT_AMBULATORY_CARE_PROVIDER_SITE_OTHER): Payer: Medicare Other

## 2012-04-16 ENCOUNTER — Encounter: Payer: Self-pay | Admitting: Pulmonary Disease

## 2012-04-16 VITALS — BP 142/82 | HR 70 | Temp 97.0°F

## 2012-04-16 DIAGNOSIS — E119 Type 2 diabetes mellitus without complications: Secondary | ICD-10-CM

## 2012-04-16 DIAGNOSIS — I10 Essential (primary) hypertension: Secondary | ICD-10-CM

## 2012-04-16 DIAGNOSIS — E213 Hyperparathyroidism, unspecified: Secondary | ICD-10-CM

## 2012-04-16 DIAGNOSIS — M25561 Pain in right knee: Secondary | ICD-10-CM

## 2012-04-16 DIAGNOSIS — F411 Generalized anxiety disorder: Secondary | ICD-10-CM

## 2012-04-16 DIAGNOSIS — K219 Gastro-esophageal reflux disease without esophagitis: Secondary | ICD-10-CM

## 2012-04-16 DIAGNOSIS — D126 Benign neoplasm of colon, unspecified: Secondary | ICD-10-CM

## 2012-04-16 DIAGNOSIS — M25569 Pain in unspecified knee: Secondary | ICD-10-CM

## 2012-04-16 DIAGNOSIS — R627 Adult failure to thrive: Secondary | ICD-10-CM

## 2012-04-16 DIAGNOSIS — M810 Age-related osteoporosis without current pathological fracture: Secondary | ICD-10-CM

## 2012-04-16 DIAGNOSIS — M1990 Unspecified osteoarthritis, unspecified site: Secondary | ICD-10-CM

## 2012-04-16 DIAGNOSIS — E039 Hypothyroidism, unspecified: Secondary | ICD-10-CM

## 2012-04-16 DIAGNOSIS — R531 Weakness: Secondary | ICD-10-CM

## 2012-04-16 DIAGNOSIS — J45909 Unspecified asthma, uncomplicated: Secondary | ICD-10-CM

## 2012-04-16 LAB — CBC WITH DIFFERENTIAL/PLATELET
Basophils Absolute: 0 10*3/uL (ref 0.0–0.1)
Basophils Relative: 0.3 % (ref 0.0–3.0)
Eosinophils Absolute: 0.5 10*3/uL (ref 0.0–0.7)
Lymphocytes Relative: 27.5 % (ref 12.0–46.0)
MCHC: 33.4 g/dL (ref 30.0–36.0)
Neutrophils Relative %: 60.2 % (ref 43.0–77.0)
RBC: 4.22 Mil/uL (ref 3.87–5.11)
WBC: 8.7 10*3/uL (ref 4.5–10.5)

## 2012-04-16 LAB — HEPATIC FUNCTION PANEL
ALT: 8 U/L (ref 0–35)
Bilirubin, Direct: 0 mg/dL (ref 0.0–0.3)
Total Protein: 6.7 g/dL (ref 6.0–8.3)

## 2012-04-16 LAB — HEMOGLOBIN A1C: Hgb A1c MFr Bld: 5.6 % (ref 4.6–6.5)

## 2012-04-16 LAB — BASIC METABOLIC PANEL
BUN: 15 mg/dL (ref 6–23)
CO2: 29 mEq/L (ref 19–32)
Chloride: 105 mEq/L (ref 96–112)
Creatinine, Ser: 1.1 mg/dL (ref 0.4–1.2)

## 2012-04-16 LAB — TSH: TSH: 3.97 u[IU]/mL (ref 0.35–5.50)

## 2012-04-16 MED ORDER — POTASSIUM CHLORIDE ER 10 MEQ PO TBCR: 10.0000 meq | EXTENDED_RELEASE_TABLET | Freq: Two times a day (BID) | ORAL | Status: DC

## 2012-04-16 NOTE — Progress Notes (Signed)
Subjective:    Patient ID: Ashley Norman, female    DOB: 07-04-1928, 78 y.o.   MRN: [id number]  HPI 78 y/o WF here for a follow up visit... she has mult med problems as listed below...   ~  January 24, 2011:  Yearly ROV "just getting older" she says;  She weaned off her Pred & hasn't taken this in months, breathing fair- some congestion, wheezing, cough... BP controlled;  Denies CP, plapit, cerebral ischemic symptoms;  She never did the CDopplers as requested last yr "I'm getting forgetful" she says;  Not checking sugars at home (last A1c= 5.8 to 6.0);  Due for f/u thyroid & parathyroid blood work as well... We will sched the CDopplers (neg w/ 0-39% bilat ICAstenoses) and f/u BMD (TScores -2.8 Spine, & -2.7 FemNecks) at her convenience...  ~  April 21, 2011:  32mo ROV & add on at family request> She has hx of Asthma/COPD, HBP, DM, Hypothyroid, Hyperparathyroid, Renal Insuffic, DJD, Osteoporosis> they relate a bizarre story going back over 4-6wks of confusion, very stubborn, refusing to take meds/ eat much/ or drink fluids; sounding depressed & some of her conversation w/ daughter had vailed suicide threats; family didn't seek med attention/ ER/ Psyche & tried to encourage her, changed her venue, & accommodate her in any way they could> but all to no avail...  They present today after an ordeal getting her into the office (took 1-2H to get her dressed & into the car, then wouldn't get out at the office); all Ashley Norman can say is "can I leave yet", her biggest concern being that we will put her in the hosp which she refuses;  Exam reveals VS- ok, Chest- clear, gr 1/6 SEM, thin abd- nontender, diffusely weak, not making good sense, delay relaxation phase of DTRs;  They are conflicted & not sure which way to go> I suggested HOSP for full eval & Psychiatric consultation, but they prefer to try outpt RX if poss> we discussed an agreement= she will take meds, eat some, & drink Glucerna & fluids; and we will check  labs & try to keep her out of the hosp if poss...    Family brought her in to see DrDBrodie w/ adult FTT, wt loss & difficulty swallowing (?dysphagia for solids, pills, & liqs vs really just refusing to swallow)> Given Megace (pt wouldn't take it due to smell & taste), & did EGD> reported neg & dilatation performed...   ~  April 28, 2011:  1wk ROV & the promise was that she would coop w/ eating, drinking, taking meds & we would endeavor to keep her out of the hosp> family reports that she is somewhat improved- taking meds, eating, drinking, etc; but difficulty w/ big pills & wants Liq for Metformin (give RIOMET) & KCl elixir;  She is making it to the bathroom & they use depends prn (they do not want BSC);  Resting ok, bowel movements ok; on Zoloft 25mg  & seems to be helping...    We discussed incr Zoloft to 50mg /d, order home PT/ OT, she is asked to continue the better intake, etc...  ~  May 16, 2011:  2wk ROV & she says "I feel OK, can I leave now"; she continues to improve> seems sl more cooperative & interactive, home PT helping some per family; apparently no pain, no dyspnea, ambulating w/ her canes better;  They called w/ incr BP at home per PT nurse ~170/90 so we restarted her Lisinopril 10mg /d & her  BP is better 110/72 today & she denies CP, palpit, syncope, edema, etc...  Still taking Zoloft 50mg /d & they note good days & bad...    LABS today> Chems wnl, BS=116 on her RIOMET 1/2 tsp Bid, K=4.1 on KCl elixir 1T in juice daily; CBC wnl w/ Hg= 13.2; TSH norm on 1/2 of the daily; & Sed=22 now on the Pred 10mg /d...  ~  September 26, 2011:  527mo ROV & she's had a mild exac w/ sl cough, sm amt beige sputum, & incr SOB w/ wheezing; they have restarted most of her prev meds including: PROAIR/ FLOVENT220- 2spbid, PRED10/d, THEODUR 300mg /d;  but they weaned the Pred10 to 1/2 tab daily;  We discussed adding Spiriva, incr Pred back to 10mg /d, continue Proair/ Flovent 2sp Bid, & add ZPak + MucinexDM +  Fluids etc... Reminded to bing all meds to each office visit>>  Asthma> as above; she has a mild AB exac & we will incr med rx as indicated; CXR today showed hyperinflation but clear lungs...  HBP> on Lisinopril10; BP=110/68 & she denies CP, palpit, syncope, edema; she is SOB due to the AB exac... BNP is normal at 61.  CBruit> on ASA 81; she denies cerebral ischemic symptoms...  DM> on RIOMET 500mg /30ml taking 1/2 tspBid; prev A1c=6.2... BS today= 134.  Hypothy> on Levothyroid38mcg- taking 1/2 daily... TSH today= 4.87  Primary Hyperpara> last 2 calcium readings have been wnl...  GERD/ Polyps> she has trouble swallowing big pills; eating soft foods ok; they note appetite fair & wt down 3# to 113#; asked to eat 3 meals/d & take Ensure between meals as supplement...  DJD/ Osteoporosis> on Fosamax weekly + MVI/ VitD1000u; ?family has been letting her take her own meds & I indicated that they nneded to dispense & control her meds for her...  Dementia/ Anxiety> on Zoloft50  ~  November 28, 2011:  27mo ROV & Ashley Norman has really "fallen off" not eating well per family, trying to take Ensure but obviously not enough her wt is down 10# to 103# today;  We discussed restarting MEGACE for appetite & careful supervision of meals for encouragement, food prefs, etc and continue the supplements;  She is still very negative & contrary> clearly not exercising or even moving much & appears to have a good bit of musc atrophy> she refuses formal PT & wouldn't allow home therapy in the past; says she knows what to do just doesn't want to do it & want to be left alone to sleep as much as she can; on Zoloft but still asking for DrKavorkian; offered Psychiatric referral but they decline & prefer to continue conservative supportive therapy & comfort measures...    Last OV we added Spiriva + Pred 10mg /d and Mucinex 1-2Bid; she has had monthly antibiotic calls w/ ZPak the Levaquin called in for bronchitic exac; chest remains sl  congested w/ rhonchi bilat & they are encouraged to use the Mucinex 2Bid + Fluids and we decided to change the Flovent/ Proair to DULERA200- 2spBid + Proair rescue..    BP is low at 94/60 w/ her wt loss on Lisinopril 10mg /d; asked to monitor BP at home & hold Lisinopril if BP<110 sys... Prev LABS & XRays are reviewed...  ~  April 16, 2012:  4-627mo ROV & family reports that she has refused to get up out of chair or try to stand for ~77mo now; weaker, can't get weight, asked to take Megace & Ensure supplements regularly;  They note she has  painful right knee & this may be part of the reason she won't get up> on Pred 10mg /d but only using Tylenol for pain; exam shows knee sl swollen , warm, tender> we will incr Pred to 20mg /d for 10d & refer to [facility] for shot in the knee for relief;  Offered home PT but she declines & children will let me know...    We reviewed prob list, meds, xrays and labs> see below for updates >> LABS 7/13:  Chems- wnl w/ BS=92 A1c=5.6;  CBC- wnl w/ Hg=12.1;  TSH=3.97         Problem List:    ADULT FTT >> see above  ASTHMA (ICD-493.90) - she is a non-smoker> Prev on TheoDur 300mg /d, PROAIR HFA 2spBid &Prn, FLOVENT 220 2spBid;  She stopped all of her meds on her own in June2012 (?why); then we restarted her Rx as below>> ~  baseline CXR w/ hyperinflation, tort Ao, NAD... ~  4/12:  Notes some cough, congestion, intermittent wheezing> on Theo/ Proair/ Flovent;  Rec- Mucinex/ Fluids... ~  CXR 4/12 showed ectatic Ao, hyperinflation c/w emphysema, clear/ NAD.Marland Kitchen. ~  7/12:  We decided to treat w/ PRED 10mg  Bid (weaned to 10mg /d) + PROAIR prn wheezing... ~  12/12: she presented w/ a mild AB exac; family needs to control all her meds; we added ZPak, Spiriva, Mucinex, Fluids... ~  CXR 12/12 showed hyperinflation but clear, osteopenia, NAD... ~  3/13:  We adjusted meds to DULERA200-2spBid, SPIRIVA-once daily, PRED 10mg /d, THEODUR 300mg /d, PROAIR-prn, & MUCINEX-2Bid w/  fluids...  HYPERTENSION (ICD-401.9) - back on LISINOPRIL 10mg /d (20mg  tab- 1/2); she had stopped all of her meds on her own June2012>  ~  cardiac cath 1991 was neg- norm coronaries (she may have had spasm). ~  labs 3/09 showed K=3.8, BUN=32, Creat=1.8 ~  labs 6/10 showed K=4.4, BUN=46, Creat=2.2.Marland Kitchen. rec> stop Maxzide & KCl, ROV 24mo. ~  labs 7/10 showed K=4.6, BUN=58, Creat=1.6.Marland Kitchen. rec> incr fluid intake, stay off Maxzide. ~  5/11:  BP=138/72, tol Rx well, no problems; denies HA, visual changes, CP, palipit, syncope, ch in dyspnea, etc; notes tr edema intermittently. ~  4/12:  BP= 138/84 & she remains essentially asymptomatic; K=3.9, BUN=25, Creat=1.4 ~  7/12:  BP= 122/82 off meds but poor intake, weak, wt loss, etc; K=3.3, BUN=23, Creat=1.3> rec K10caps two daily. ~  8/12:  BP= 110/72 back on Lisinopril 10mg /d due to incr BP at home; K=4.1 on 1tablesp KCL elixir daily. ~  12/12:  BP= 110/68 on Lisinopril monotherapy... Note: BNP= 61. ~  3/13:  BP= 94/60 on Lisin10 & asked to monitor BP at home & hold if BP<110 sys... ~  7/13:  BP= 142/82 on Lisin10 & asked to continue daily med Rx...  ABNORMAL ELECTROCARDIOGRAM (ICD-794.31) - EKG shows NSR, IVCD, LAD, poor R progression... ~  2DEcho 5/11 ==> ordered but pt never returned to have this done...  CAROTID BRUIT (ICD-785.9) - back on ASA 81mg /d> she denies cerebral ischemic symptoms... ~  CDopplers 5/11 ==> ordered but pt never returned to have this done... ~  CDopplers completed 5/12 & showed mild plaque in bulbs, 0-39% bilat ICA stenoses, antegrade vertebral flow.  DIABETES MELLITUS (ICD-250.00) - back on METFORMIN but taking RIOMET 500mg /46ml (1/2 tsp Bid) & appeared to tolerate well... ~  labs 1/08 showed BS=172, HgA1c=6.5 & Metformin 500mg /d started... ~  labs 3/09 showed BS= 99, A1c= 5.7 ~  labs 6/10 showed BS= 138, A1c= 6.0.Marland Kitchen. rec> same med. ~  labs 5/11 (wt=142#)  showed BS= 141, A1c= 5.8 ~  Labs 4/12 (wt=129#) showed BS= 100, A1c= 5.9.Marland KitchenMarland Kitchen  rec to decr Metformin to 1/2 tab each AM. ~  Labs 7/12 (wt=?<120#) & off all meds for 1+mo showed BS=77, A1c=6.2; rec continue MetformER- 1/2Qam (pt on Pred). ~  8/12:  Pt switched to Liq RIOMET 1/2 tsp Bid;  Labs showed BS= 116 ~  Labs 12/12 on Riomet-1/2tspBid showed BS= 134 ~  Labs 7/13 on Riomet showed BS= 92, a1c= 5.6.Marland KitchenMarland Kitchen rec to STOP Riomet & Rx w/ diet alone for now...  HYPOTHYROIDISM (ICD-244.9) - prev on LEVOTHYROID 75 micrograms/d (w/ good energy & clinically euthyroid); she stopped all meds on her own 6/12; we restarted LEVOTHYROID - 1/2 tab daily 7/12... ~  labs 1/08 showed TSH=1.84 on 75 micrograms/d... ~  labs 3/09 showed TSH= 5.05 ~  labs 6/10 showed TSH= 1.27 ~  labs 5/11 showed TSH= 0.64 ~  Labs 4/12 on Levo75 showed TSH= 1.61 ~  Labs 7/12 off meds showed TSH= 5.40 & rec to restart 1/2 of the tab daily. ~  Labs 8/12 on Levo75-1/2 showed TSH= 3.47 & rec to continue same. ~  Labs 12/12 on Levothy75-1/2 showed TSH= 4.87 ~  Labs 7/13on Levothy75-1/2 showed TSH= 3.97  ? of PRIMARY HYPERPARATHYROIDISM (ICD-252.01) - Ca++ levels in the 10.6-11.0 range... PO4- levels in the 2.1-2.4 range... PTH levels slowly rising 40-65... therefore following clinically w/ serial labs >> ~  labs 1/08 showed:  Ca++ 11.0, PTH= 42.4.Marland KitchenMarland KitchenMarland Kitchen ~  labs 3/09 showed Ca= 10.3, PO4= 3.2, PTH= 50 ~  labs 6/10 showed Ca= 10.9, PO4= 3.7, PTH= 65 ~  labs 5/11 showed Ca= 10.5-10.9, Phos= 3.0, PTH= 111... rec> continue to follow closely. ~  Labs 4/12 showed Ca= 11.0, PO4= not done, PTH= 80... NOT progressive- rec no calc supplements, incr fluids. ~  Labs 7/12 & 8/12 showed Ca= 9.9 ~  Labs 12/12 showed Ca= 10.2 ~  Labs 7/13 showed Ca= 9.9  GERD (ICD-530.81) - remote hx of PUD w/ GIB in the 80's... eval by DrDBrodie in the past... prev on Zantac and Reglan but hasn't filled these since 2009... denies nausea, vomiting, heartburn, diarrhea, constipation, blood in stool, abdominal pain, swelling, gas... she is  rec to use OTC Prilosec vs Zantac Prn & avoid Tums etc... ~  7/12:  GI eval by DrDBrodie for adult FTT, wt loss & difficulty swallowing (?dysphagia for solids/pills/ liqs vs really just refusing to swallow)> Given Megace (pt wouldn't take it due to smell & taste), & did EGD> reported neg & dilatation performed...   COLONIC POLYPS (ICD-211.3) - there is a pos family history of colon cancer (in brother)... last colonoscopy was 5/98 [facility]) showing diminutive polyp (tubular adenoma)... f/u planned 3-5 yrs, but she never went... she is overdue and needs to sched this important procedure but she has repeatedly refused GI referral...  RENAL INSUFFICIENCY (ICD-588.9)  ~  labs 1/08 showed BUN= 21, Creat= 1.2 ~  labs 3/09 showed BUN= 32, Creat= 1.8 ~  labs 6/10 showed BUN= 46, Creat= 2.2.Marland KitchenMarland Kitchen rec to stop Maxzide (on 1/2 daily) ~  labs 7/10 showed BUN= 58, Creat= 1.6.Marland Kitchen. rec> incr fluid intake. ~  labs 5/11 showed BUN= 28, Creat= 1.5 ~  Labs 4/12 showed BUN=25, Creat=1.4 ~  Labs 7/12 showed BUN= 23, Creat= 1.3 ~  Labs 8/12 showed BUN= 23, Creat= 1.0 ~  Labs 12/12 showed BUN= 19, Creat= 1.1 ~  Labs 7/13 showed BUN= 15, Creat= 1.1  HEADACHE (ICD-784.0)  DEGENERATIVE  JOINT DISEASE (ICD-715.90) - mod DJD and walks w/ crutches "because of my ankles"... she's seen [facility] in the past... started on low dose Pred 6/10 for this & Asthma> improved & she weaned off the Pred on her own... ~  6/10: CC= "my feet" c/o arthritic pain in feet & ankles... rec> trial Pred 10mg /d w/ ROV 71mo. ~  7/10: pt states improved on the Pred which has been weaned... ~  7/12:  She is in wheelchair> can't vs won't stand ~  7/13:  Family states she's refused to stand, walk, exercise for ~74mo now; exam shows swollen tender right knee; we will incr Pred20mg /d & refer to Ortho.  OSTEOPOROSIS (ICD-733.00) - currently taking ALENDRONATE 70mg /wk, no calcium supplement due to borderline hypercalcemia, MVI + Vit D 1000 u OTC  supplement. ~  BMD 3/09 here showed TScores -2.9 in Spine, and -2.9 in right FemNeck... started on Alendronate 70mg /wk. ~  Vit D level 3/09 = 34... rec to start Vit D 1000 u OTC supplement. ~  Vit D level 6/10 = 50 ~  Vit D level 5/11 = 61 ~  5/12:  f/u BMD ==> TScores showed -2.8 in Spine, and -2.7 in bilat FemNecks> Continue Alendronate, MVI, Vit D.  ANXIETY (ICD-300.00) >> she has a mild dementia as well w/ odd affect etc; on ZOLOFT 50mg /d & seems improved per family...  HEALTH MAINTENANCE: ~  GYN: she hasn't seen GYN (DrCollins) in yrs and needs PAP, Mammogram, etc (she has been reminded to set this up w/ each subseq visit here but declines to do so; asked to let us set this up for her but she declines our assistance). ~  GI:  last colonoscopy 1998 at [facility] by [facility]- sm adenoma removed; advised f/u w/ each subseq visit here but she declines GI follow up; referral made to [facility] for this important f/u procedure but she never went. ~  Immunizations:  had Pneumovax 2001... Tetanus (TDAP) given 5/11... she refuses the seasonal Flu vaccines & is reminded of the importance of these yearly vaccinations.   Past Surgical History  Procedure Date  . Tonsillectomy and adenoidectomy   . Appendectomy     Outpatient Encounter Prescriptions as of 04/16/2012  Medication Sig Dispense Refill  . alendronate (FOSAMAX) 70 MG tablet TAKE 1 TABLET BY MOUTH EVERY WEEK  4 tablet  5  . aspirin 81 MG tablet Take 81 mg by mouth daily.        . Cholecalciferol (VITAMIN D) 1000 UNITS capsule Take 1,000 Units by mouth daily.        . ferrous sulfate 325 (65 FE) MG tablet Take 325 mg by mouth daily with breakfast.        . levothyroxine (SYNTHROID, LEVOTHROID) 75 MCG tablet take 1 tablet by mouth once daily  30 tablet  3  . lisinopril (PRINIVIL,ZESTRIL) 20 MG tablet Take 10 mg by mouth daily.        . megestrol (MEGACE ORAL) 40 MG/ML suspension Take 1 teaspoon by mouth three times daily with meals  240 mL  prn  .  Metformin HCl (RIOMET) 500 MG/5ML SOLN Take 1/2 teaspoon twice daily       . Mometasone Furo-Formoterol Fum (DULERA) 200-5 MCG/ACT AERO Inhale 2 puffs into the lungs 2 (two) times daily.  1 Inhaler  11  . Multiple Vitamin (MULTIVITAMIN PO) Take 1 tablet by mouth daily.        . predniSONE (DELTASONE) 10 MG tablet TAKE 1 TABLET BY  MOUTH ONCE DAILY IN THE MORNING  30 tablet  3  . PROAIR HFA 108 (90 BASE) MCG/ACT inhaler inhale 2 puffs by mouth every 6 hours if needed for wheezing  1 Inhaler  4  . sertraline (ZOLOFT) 50 MG tablet Take 1 tablet (50 mg total) by mouth daily.  30 tablet  5  . theophylline (THEODUR) 300 MG 12 hr tablet TAKE 1 TABLET BY MOUTH DAILY  30 tablet  0  . tiotropium (SPIRIVA HANDIHALER) 18 MCG inhalation capsule Place 1 capsule (18 mcg total) into inhaler and inhale daily.  30 capsule  11  . DISCONTD: potassium chloride 20 MEQ/15ML (10%) solution 1 tbs  by mouth every day  450 mL  5  . potassium chloride (K-DUR) 10 MEQ tablet Take 1 tablet (10 mEq total) by mouth 2 (two) times daily.  60 tablet  5  . DISCONTD: predniSONE (DELTASONE) 10 MG tablet Take 10 mg by mouth daily.         Allergies  Allergen Reactions  . Amoxicillin-Pot Clavulanate     REACTION: rash, itching  . Cephalexin     REACTION: rash, itching  . Codeine     REACTION: rash, itching    Current Medications, Allergies, Past Medical History, Past Surgical History, Family History, and Social History were reviewed in [employer] record.    Review of Systems         See HPI - all other systems neg except as noted... The patient complains of difficulty walking, otherw she is very stoic & denies all symptoms.  The patient denies anorexia, fever, weight loss, weight gain, vision loss, hoarseness, chest pain, syncope, peripheral edema, prolonged cough, headaches, hemoptysis, abdominal pain, melena, hematochezia, severe indigestion/heartburn, hematuria, incontinence, muscle weakness,  suspicious skin lesions, transient blindness, depression, unusual weight change, abnormal bleeding, enlarged lymph nodes, and angioedema.     Objective:   Physical Exam     WD, Thin, 78 y/o WF, chronically ill appearing, now weaker, in wheelchair, difficulty standing unaided, & cachectic w/o focal localizing symptoms... GENERAL:  Alert & sl confused, less agitated & more alert... HEENT:  Calvin/AT, EOM-wnl, PERRLA, EACs-clear, TMs-wnl, NOSE-clear, THROAT-clear & wnl. NECK:  Supple w/ fairROM; no JVD; normal carotid impulses w/ 1+ bilat bruits; no thyromegaly or nodules palpated; no lymphadenopathy. CHEST:  decr BS bilat, clear to P & A; without wheezes or rales; + scat rhonchi heard...  HEART:  Regular Rhythm; +gr1-2 sys murmur, no rubs or gallops detected... ABDOMEN:  Soft & nontender; normal bowel sounds; no organomegaly or masses palpated... EXT: without deformities, mild arthritic changes; no varicose veins/ venous insuffic/ or edema. She has bilat canes but unable to stand unaided... NEURO:  CN's intact;  no focal neuro deficits, diffusely weak... DERM:  No lesions noted; no rash etc...  RADIOLOGY DATA:  Reviewed in the EPIC EMR & discussed w/ the patient...  LABORATORY DATA:  Reviewed in the EPIC EMR & discussed w/ the patient...   Assessment & Plan:   ADULT FTT>  FTT persists despite all efforts by [place] & family;  Continue Megace, ensure, encouragement;  They are not optimistic that she will walk again...   ASTHMA>  She has a component of fixed airflow obstruction from her yrs of RADS; she prev stopped all her meds (Pred, Theophyllin, Proair, Flovent); we prev bargained for PRED 10mg  Qd & PROAIR Q4H prn> then they gradually restarted all her other meds in the interim; now w/ adjustment in meds to include Dulera200-2spBid,  Spiriva daily, Pred10mg /d, Mucinex-2Bid, fluids, etc; we will try to wean down the Theodur...  HBP>  Controlled back on her ACE & we will continue to monitor her BP as  her intake (hopefully) improves; K= 4.1 on the elixir 1 tablesp daily==> they say she won't drink it anymore, therefore change to K10caps-2Qd...  Abn EKG/ CBruit>  She never went for prev 2DEcho/ CDopplers (ordered 5/11) & seemed oblivious to recommendations; discussed w/ pt> stable on ASA 81mg /d & not interested in aggressive eval or treatments; She finally had CDopplers done 5/12 & they were OK= mild plaque & 0-39% ICA stenoses> rec continue ASA.  DM>  On RIOMET 500mg /54ml taking 1/2 tsp bid, eating poorly, on Pred too, labs 7/13 showed BS=92 A1c=5.6 and we decided to STOP the Riomet.  Hypothy>  Prev stable on Synth74mcg dose & TSH 4/12 was 1.61; TSH off meds = 5.40 & she restarted 1/2 of the tab daily; f/u TSH 12/12 & 7/13 = ok on the 1/2 tab, continue same.  ?Hyperpara>  Prev Ca up to 11 and PTH= 80 & not progressive, therefore continue to watch carefully & REC no calc supplements etc; Ca= 10.2, 9.9 currently & stable...  Hx Renal insuffic>  Creat has improved w/ better intake to 1.1 today...  DJD/ Osteoporosis>  Mod severe DJD & now w/ right knee pain, warm, swelling- we decided to incr Pred to 20mg  x10d 7 refer to Ortho; f/u BMD on the Fosamax ==> sl improved, continue same.  DEPRESSION/ ?Psychiatric illness/ Adult FTT/ Weakness> Change in personality, mental status, mood, family relations, etc over the past several months; we discussed plan that REQUIRES her to take meds as outlined, take in adeq nutrition & Glucerna supplements, & drink fluids... On Zoloft 50mg /d & slowly improved...  Other medical issues as noted >> SED rate was elev at 67 ==> 22 on the Pred etc...   Patient's Medications  New Prescriptions   POTASSIUM CHLORIDE (K-DUR) 10 MEQ TABLET    Take 1 tablet (10 mEq total) by mouth 2 (two) times daily.  Previous Medications   ALENDRONATE (FOSAMAX) 70 MG TABLET    TAKE 1 TABLET BY MOUTH EVERY WEEK   ASPIRIN 81 MG TABLET    Take 81 mg by mouth daily.     CHOLECALCIFEROL  (VITAMIN D) 1000 UNITS CAPSULE    Take 1,000 Units by mouth daily.     FERROUS SULFATE 325 (65 FE) MG TABLET    Take 325 mg by mouth daily with breakfast.     LEVOTHYROXINE (SYNTHROID, LEVOTHROID) 75 MCG TABLET    take 1 tablet by mouth once daily   LISINOPRIL (PRINIVIL,ZESTRIL) 20 MG TABLET    Take 10 mg by mouth daily.     MEGESTROL (MEGACE ORAL) 40 MG/ML SUSPENSION    Take 1 teaspoon by mouth three times daily with meals   METFORMIN HCL (RIOMET) 500 MG/5ML SOLN    Take 1/2 teaspoon twice daily    MOMETASONE FURO-FORMOTEROL FUM (DULERA) 200-5 MCG/ACT AERO    Inhale 2 puffs into the lungs 2 (two) times daily.   MULTIPLE VITAMIN (MULTIVITAMIN PO)    Take 1 tablet by mouth daily.     PREDNISONE (DELTASONE) 10 MG TABLET    TAKE 1 TABLET BY MOUTH ONCE DAILY IN THE MORNING   PROAIR HFA 108 (90 BASE) MCG/ACT INHALER    inhale 2 puffs by mouth every 6 hours if needed for wheezing   SERTRALINE (ZOLOFT) 50 MG TABLET  Take 1 tablet (50 mg total) by mouth daily.   THEOPHYLLINE (THEODUR) 300 MG 12 HR TABLET    TAKE 1 TABLET BY MOUTH DAILY   TIOTROPIUM (SPIRIVA HANDIHALER) 18 MCG INHALATION CAPSULE    Place 1 capsule (18 mcg total) into inhaler and inhale daily.  Modified Medications   No medications on file  Discontinued Medications   POTASSIUM CHLORIDE 20 MEQ/15ML (10%) SOLUTION    1 tbs  by mouth every day   PREDNISONE (DELTASONE) 10 MG TABLET    Take 10 mg by mouth daily.

## 2012-04-16 NOTE — Patient Instructions (Addendum)
Today we updated your med list in our EPIC system...    We decided to change the KCl liq to K10 capsules- take 2 caps daily...    We also decided to try to increase the Prednisone transiently from 10mg /d to 20mg  /d for the next 10d...  We will arrange for an Orthopedic consultation regarding her swollen painful right knee (hopefully a shot will help)...  The most important thing remains exercise & physical therapy...     Let me know if she is willing to give it a go at home (home therapy several days per week)...  Today we did some follow up blood work...    We will cal you w/ the results when avail...  Call for any questions or if we can be of service in any way...  Let's plan a follow up visit in 4 months.Marland KitchenMarland Kitchen

## 2012-04-24 ENCOUNTER — Other Ambulatory Visit: Payer: Self-pay | Admitting: Pulmonary Disease

## 2012-05-30 ENCOUNTER — Other Ambulatory Visit: Payer: Self-pay | Admitting: Pulmonary Disease

## 2012-05-31 ENCOUNTER — Other Ambulatory Visit: Payer: Self-pay | Admitting: Pulmonary Disease

## 2012-06-03 ENCOUNTER — Other Ambulatory Visit: Payer: Self-pay | Admitting: Pulmonary Disease

## 2012-06-19 ENCOUNTER — Other Ambulatory Visit: Payer: Self-pay | Admitting: Pulmonary Disease

## 2012-07-23 ENCOUNTER — Other Ambulatory Visit: Payer: Self-pay | Admitting: Pulmonary Disease

## 2012-11-17 ENCOUNTER — Other Ambulatory Visit: Payer: Self-pay | Admitting: Pulmonary Disease

## 2012-11-22 ENCOUNTER — Ambulatory Visit: Payer: Medicare Other | Admitting: Adult Health

## 2013-01-04 ENCOUNTER — Other Ambulatory Visit (INDEPENDENT_AMBULATORY_CARE_PROVIDER_SITE_OTHER): Payer: Medicare Other

## 2013-01-04 ENCOUNTER — Ambulatory Visit (INDEPENDENT_AMBULATORY_CARE_PROVIDER_SITE_OTHER): Payer: Medicare Other | Admitting: Pulmonary Disease

## 2013-01-04 ENCOUNTER — Encounter: Payer: Self-pay | Admitting: Pulmonary Disease

## 2013-01-04 ENCOUNTER — Other Ambulatory Visit: Payer: Self-pay | Admitting: Pulmonary Disease

## 2013-01-04 ENCOUNTER — Ambulatory Visit (INDEPENDENT_AMBULATORY_CARE_PROVIDER_SITE_OTHER)
Admission: RE | Admit: 2013-01-04 | Discharge: 2013-01-04 | Disposition: A | Discharge status: Home / Self Care | Payer: Medicare Other | Source: Ambulatory Visit | Attending: Pulmonary Disease | Admitting: Pulmonary Disease

## 2013-01-04 VITALS — BP 122/80 | HR 99 | Temp 97.4°F

## 2013-01-04 DIAGNOSIS — R0989 Other specified symptoms and signs involving the circulatory and respiratory systems: Secondary | ICD-10-CM

## 2013-01-04 DIAGNOSIS — K219 Gastro-esophageal reflux disease without esophagitis: Secondary | ICD-10-CM

## 2013-01-04 DIAGNOSIS — R627 Adult failure to thrive: Secondary | ICD-10-CM

## 2013-01-04 DIAGNOSIS — R7309 Other abnormal glucose: Secondary | ICD-10-CM

## 2013-01-04 DIAGNOSIS — R7303 Prediabetes: Secondary | ICD-10-CM

## 2013-01-04 DIAGNOSIS — R0609 Other forms of dyspnea: Secondary | ICD-10-CM

## 2013-01-04 DIAGNOSIS — R0689 Other abnormalities of breathing: Secondary | ICD-10-CM

## 2013-01-04 DIAGNOSIS — R0600 Dyspnea, unspecified: Secondary | ICD-10-CM

## 2013-01-04 DIAGNOSIS — I10 Essential (primary) hypertension: Secondary | ICD-10-CM

## 2013-01-04 DIAGNOSIS — N259 Disorder resulting from impaired renal tubular function, unspecified: Secondary | ICD-10-CM

## 2013-01-04 DIAGNOSIS — J45909 Unspecified asthma, uncomplicated: Secondary | ICD-10-CM

## 2013-01-04 DIAGNOSIS — E039 Hypothyroidism, unspecified: Secondary | ICD-10-CM

## 2013-01-04 DIAGNOSIS — D126 Benign neoplasm of colon, unspecified: Secondary | ICD-10-CM

## 2013-01-04 DIAGNOSIS — E213 Hyperparathyroidism, unspecified: Secondary | ICD-10-CM

## 2013-01-04 DIAGNOSIS — M1990 Unspecified osteoarthritis, unspecified site: Secondary | ICD-10-CM

## 2013-01-04 DIAGNOSIS — M810 Age-related osteoporosis without current pathological fracture: Secondary | ICD-10-CM

## 2013-01-04 DIAGNOSIS — F411 Generalized anxiety disorder: Secondary | ICD-10-CM

## 2013-01-04 LAB — CBC WITH DIFFERENTIAL/PLATELET
Basophils Relative: 0.5 % (ref 0.0–3.0)
Eosinophils Relative: 4.7 % (ref 0.0–5.0)
Hemoglobin: 15.6 g/dL — ABNORMAL HIGH (ref 12.0–15.0)
Lymphocytes Relative: 22.6 % (ref 12.0–46.0)
Monocytes Relative: 4.8 % (ref 3.0–12.0)
Neutro Abs: 7.9 10*3/uL — ABNORMAL HIGH (ref 1.4–7.7)
RBC: 5.4 Mil/uL — ABNORMAL HIGH (ref 3.87–5.11)
WBC: 11.7 10*3/uL — ABNORMAL HIGH (ref 4.5–10.5)

## 2013-01-04 LAB — BASIC METABOLIC PANEL
CO2: 26 mEq/L (ref 19–32)
Calcium: 10.3 mg/dL (ref 8.4–10.5)
GFR: 37.89 mL/min — ABNORMAL LOW (ref >60.00)
Sodium: 136 mEq/L (ref 135–145)

## 2013-01-04 MED ORDER — MEGESTROL ACETATE 40 MG/ML PO SUSP: ORAL | Status: DC

## 2013-01-04 MED ORDER — LEVALBUTEROL HCL 0.63 MG/3ML IN NEBU: 1.0000 | INHALATION_SOLUTION | Freq: Three times a day (TID) | RESPIRATORY_TRACT | Status: DC

## 2013-01-04 MED ORDER — PREDNISONE 20 MG PO TABS: 20.0000 mg | ORAL_TABLET | Freq: Every day | ORAL | Status: DC

## 2013-01-04 NOTE — Patient Instructions (Addendum)
Today we updated your med list in our EPIC system...     We gave you a nebulizer treatment in the office> & arranged a nebulizer for home use...    Use it 3 times daily w/ the XOPENEX...  We decided to increase the Prednisone to 20mg /d for now...  Today we did  follow up CXR & lab work...    We will contact you w/ the results when available...   Call for any questions...  Let's plan a follow up visit in 103mo, sooner if needed for problems.Marland KitchenMarland Kitchen

## 2013-01-04 NOTE — Progress Notes (Signed)
Subjective:    Patient ID: Ashley Norman, female    DOB: 1927/12/18, 79 y.o.   MRN: [id number]  HPI 79 y/o WF here for a follow up visit... she has mult med problems as listed below...   ~  September 26, 2011:  43mo ROV & she's had a mild exac w/ sl cough, sm amt beige sputum, & incr SOB w/ wheezing; they have restarted most of her prev meds including: PROAIR/ FLOVENT220- 2spbid, PRED10/d, THEODUR 300mg /d;  but they weaned the Pred10 to 1/2 tab daily;  We discussed adding Spiriva, incr Pred back to 10mg /d, continue Proair/ Flovent 2sp Bid, & add ZPak + MucinexDM + Fluids etc... Reminded to bing all meds to each office visit>>    Asthma> as above; she has a mild AB exac & we will incr med rx as indicated; CXR today showed hyperinflation but clear lungs...    HBP> on Lisinopril10; BP=110/68 & she denies CP, palpit, syncope, edema; she is SOB due to the AB exac... BNP is normal at 61.    CBruit> on ASA 81; she denies cerebral ischemic symptoms...    DM> on RIOMET 500mg /57ml taking 1/2 tspBid; prev A1c=6.2... BS today= 134.    Hypothy> on Levothyroid65mcg- taking 1/2 daily... TSH today= 4.87    Primary Hyperpara> last 2 calcium readings have been wnl...    GERD/ Polyps> she has trouble swallowing big pills; eating soft foods ok; they note appetite fair & wt down 3# to 113#; asked to eat 3 meals/d & take Ensure between meals as supplement...    DJD/ Osteoporosis> on Fosamax weekly + MVI/ VitD1000u; ?family has been letting her take her own meds & I indicated that they nneded to dispense & control her meds for her...    Dementia/ Anxiety> on Zoloft50  ~  November 28, 2011:  33mo ROV & Ashley Norman has really "fallen off" not eating well per family, trying to take Ensure but obviously not enough her wt is down 10# to 103# today;  We discussed restarting MEGACE for appetite & careful supervision of meals for encouragement, food prefs, etc and continue the supplements;  She is still very negative & contrary>  clearly not exercising or even moving much & appears to have a good bit of musc atrophy> she refuses formal PT & wouldn't allow home therapy in the past; says she knows what to do just doesn't want to do it & want to be left alone to sleep as much as she can; on Zoloft but still asking for DrKavorkian; offered Psychiatric referral but they decline & prefer to continue conservative supportive therapy & comfort measures...    Last OV we added Spiriva + Pred 10mg /d and Mucinex 1-2Bid; she has had monthly antibiotic calls w/ ZPak the Levaquin called in for bronchitic exac; chest remains sl congested w/ rhonchi bilat & they are encouraged to use the Mucinex 2Bid + Fluids and we decided to change the Flovent/ Proair to DULERA200- 2spBid + Proair rescue..    BP is low at 94/60 w/ her wt loss on Lisinopril 10mg /d; asked to monitor BP at home & hold Lisinopril if BP<110 sys... Prev LABS & XRays are reviewed...  ~  April 16, 2012:  4-68mo ROV & family reports that she has refused to get up out of chair or try to stand for ~60mo now; weaker, can't get weight, asked to take Megace & Ensure supplements regularly;  They note she has painful right knee & this may be part  of the reason she won't get up> on Pred 10mg /d but only using Tylenol for pain; exam shows knee sl swollen , warm, tender> we will incr Pred to 20mg /d for 10d & refer to [facility] for shot in the knee for relief;  Offered home PT but she declines & children will let me know...    We reviewed prob list, meds, xrays and labs> see below for updates >> LABS 7/13:  Chems- wnl w/ BS=92 A1c=5.6;  CBC- wnl w/ Hg=12.1;  TSH=3.97  ~  January 04, 2013:  79mo ROV & [name] continues to deteriorate according to her family- less cooperation, less mobile, needs help getting up & about/ bathing/ toileting/ etc; In addition she's had incr SOB w/ some cough, wheezing, & "hard breathing"; they are wondering about home Oxygen but her RA resting O2 sat= 92% & as noted she is not  active (we will order ONO to check nocturnal sats);  We gave her a NEB treatment in office w/ some benmefit & will arrange for a Home NEB unit + XOPENEX Tid; & incr her Pred to 20mg /d...      Asthma w/ exac> on Pred10, Theo300-1/2Bid, Dulera2Bid, Spiriva, Proair prn; c/o recent exac w/ incr SOB, cough, wheezing; CXR-NAD and we decided to try Depo, incr Pred to 20mg /d & home NEB w/ XopenexTid...    HBP> on Lisinopril10 + K10; BP=122/80 & she denies CP, palpit, syncope, edema; she is SOB due to the AB exac...    CBruit> on ASA 81; she denies cerebral ischemic symptoms...    DM> off Riomet, on diet alone;  Labs today showed BS= 134; asked to incr oral fluids & attention to nutrition...    Hypothy> ?on Levothyroid93mcg- 1/2 or 1 daily?... TSH 7/13 = 3.97 (they did not bring med list or bottles to this OV).Marland KitchenMarland Kitchen    ?Primary Hyperpara> last several calcium readings have been wnl...    GERD/ Polyps> she has trouble swallowing big pills; eating soft foods ok; they note appetite fair & she can't stand unsupported for a weight; asked to eat 3 meals/d & take Ensure between meals as supplement...    DJD/ Osteoporosis> on Fosamax weekly + MVI/ VitD1000u; ? of compliance but family is supposed to be supervising all meds...    Dementia/ Anxiety> on Zoloft50; she has a senile dementia, less cooperative, family is committed to home care, not yet ready for hospice consideration...    Adult FTT> the sum total of all her problems & her inability to cooperate & assist in her own care/ decisions...  We reviewed prob list, meds, xrays and labs> see below for updates >>  CXR 4/14 showed normal heart size, hyperexpanded, clear lungs, NAD... LABS 4/14:  Chems- ok x BS=134, BUN=40, Cr=1.4;  CBC- ok w/ Hg=15.6, Wbc=11.7.Marland KitchenMarland Kitchen          Problem List:    ADULT FTT >> see above  ASTHMA (ICD-493.90) - she is a non-smoker> Prev on TheoDur 300mg /d, PROAIR HFA 2spBid &Prn, FLOVENT 220 2spBid;  She stopped all of her meds on her own in  June2012 (?why); then we restarted her Rx as below>> ~  baseline CXR w/ hyperinflation, tort Ao, NAD... ~  4/12:  Notes some cough, congestion, intermittent wheezing> on Theo/ Proair/ Flovent;  Rec- Mucinex/ Fluids... ~  CXR 4/12 showed ectatic Ao, hyperinflation c/w emphysema, clear/ NAD.Marland Kitchen. ~  7/12:  We decided to treat w/ PRED 10mg  Bid (weaned to 10mg /d) + PROAIR prn wheezing... ~  12/12: she presented  w/ a mild AB exac; family needs to control all her meds; we added ZPak, Spiriva, Mucinex, Fluids... ~  CXR 12/12 showed hyperinflation but clear, osteopenia, NAD... ~  3/13:  We adjusted meds to DULERA200-2spBid, SPIRIVA-once daily, PRED 10mg /d, THEODUR 300mg /d, PROAIR-prn, & MUCINEX-2Bid w/ fluids... ~  4/14: on Pred10, Theo300-1/2Bid, Dulera2Bid, Spiriva, Proair prn; c/o recent exac w/ incr SOB, cough, wheezing; CXR-NAD and we decided to try Depo, incr Pred to 20mg /d & home NEB w/ XopenexTid.  HYPERTENSION (ICD-401.9) - back on LISINOPRIL 10mg /d (20mg  tab- 1/2); she had stopped all of her meds on her own June2012>  ~  cardiac cath 1991 was neg- norm coronaries (she may have had spasm). ~  labs 3/09 showed K=3.8, BUN=32, Creat=1.8 ~  labs 6/10 showed K=4.4, BUN=46, Creat=2.2.Marland Kitchen. rec> stop Maxzide & KCl, ROV 32mo. ~  labs 7/10 showed K=4.6, BUN=58, Creat=1.6.Marland Kitchen. rec> incr fluid intake, stay off Maxzide. ~  5/11:  BP=138/72, tol Rx well, no problems; denies HA, visual changes, CP, palipit, syncope, ch in dyspnea, etc; notes tr edema intermittently. ~  4/12:  BP= 138/84 & she remains essentially asymptomatic; K=3.9, BUN=25, Creat=1.4 ~  7/12:  BP= 122/82 off meds but poor intake, weak, wt loss, etc; K=3.3, BUN=23, Creat=1.3> rec K10caps two daily. ~  8/12:  BP= 110/72 back on Lisinopril 10mg /d due to incr BP at home; K=4.1 on 1tablesp KCL elixir daily. ~  12/12:  BP= 110/68 on Lisinopril monotherapy... Note: BNP= 61. ~  3/13:  BP= 94/60 on Lisin10 & asked to monitor BP at home & hold if BP<110  sys... ~  7/13:  BP= 142/82 on Lisin10 & asked to continue daily med Rx... ~  4/14: on Lisinopril10 + K10; BP=122/80 & she denies CP, palpit, syncope, edema; she is SOB due to the AB exac.  ABNORMAL ELECTROCARDIOGRAM (ICD-794.31) - EKG shows NSR, IVCD, LAD, poor R progression... ~  2DEcho 5/11 ==> ordered but pt never returned to have this done...  CAROTID BRUIT (ICD-785.9) - back on ASA 81mg /d> she denies cerebral ischemic symptoms... ~  CDopplers 5/11 ==> ordered but pt never returned to have this done... ~  CDopplers completed 5/12 & showed mild plaque in bulbs, 0-39% bilat ICA stenoses, antegrade vertebral flow.  DIABETES MELLITUS (ICD-250.00) - back on METFORMIN but taking RIOMET 500mg /75ml (1/2 tsp Bid) & appeared to tolerate well... ~  labs 1/08 showed BS=172, HgA1c=6.5 & Metformin 500mg /d started... ~  labs 3/09 showed BS= 99, A1c= 5.7 ~  labs 6/10 showed BS= 138, A1c= 6.0.Marland Kitchen. rec> same med. ~  labs 5/11 (wt=142#) showed BS= 141, A1c= 5.8 ~  Labs 4/12 (wt=129#) showed BS= 100, A1c= 5.9.Marland KitchenMarland Kitchen rec to decr Metformin to 1/2 tab each AM. ~  Labs 7/12 (wt=?<120#) & off all meds for 1+mo showed BS=77, A1c=6.2; rec continue MetformER- 1/2Qam (pt on Pred). ~  8/12:  Pt switched to Liq RIOMET 1/2 tsp Bid;  Labs showed BS= 116 ~  Labs 12/12 on Riomet-1/2tspBid showed BS= 134 ~  Labs 7/13 on Riomet showed BS= 92, a1c= 5.6.Marland KitchenMarland Kitchen rec to STOP Riomet & Rx w/ diet alone for now... ~  4/14: off Riomet, on diet alone;  Labs today showed BS= 134; asked to incr oral fluids & attention to nutrition...  HYPOTHYROIDISM (ICD-244.9) - prev on LEVOTHYROID 75 micrograms/d (w/ good energy & clinically euthyroid); she stopped all meds on her own 6/12; we restarted LEVOTHYROID - 1/2 tab daily 7/12... ~  labs 1/08 showed TSH=1.84 on 75 micrograms/d... ~  labs  3/09 showed TSH= 5.05 ~  labs 6/10 showed TSH= 1.27 ~  labs 5/11 showed TSH= 0.64 ~  Labs 4/12 on Levo75 showed TSH= 1.61 ~  Labs 7/12 off meds showed TSH=  5.40 & rec to restart 1/2 of the tab daily. ~  Labs 8/12 on Levo75-1/2 showed TSH= 3.47 & rec to continue same. ~  Labs 12/12 on Levothy75-1/2 showed TSH= 4.87 ~  Labs 7/13 on Levothy75-1/2 showed TSH= 3.97 ~  4/14: Med list indicates Levothyroid - ?taking 1 tab daily? (they did not bring med bottles or list today)...  ? of PRIMARY HYPERPARATHYROIDISM (ICD-252.01) - Ca++ levels in the 10.6-11.0 range... PO4- levels in the 2.1-2.4 range... PTH levels slowly rising 40-65... therefore following clinically w/ serial labs >> ~  labs 1/08 showed:  Ca++ 11.0, PTH= 42.4.Marland KitchenMarland KitchenMarland Kitchen ~  labs 3/09 showed Ca= 10.3, PO4= 3.2, PTH= 50 ~  labs 6/10 showed Ca= 10.9, PO4= 3.7, PTH= 65 ~  labs 5/11 showed Ca= 10.5-10.9, Phos= 3.0, PTH= 111... rec> continue to follow closely. ~  Labs 4/12 showed Ca= 11.0, PO4= not done, PTH= 80... NOT progressive- rec no calc supplements, incr fluids. ~  Labs 7/12 & 8/12 showed Ca= 9.9 ~  Labs 12/12 showed Ca= 10.2 ~  Labs 7/13 showed Ca= 9.9 ~  Labs 4/14 showed Ca= 10.3  GERD (ICD-530.81) - remote hx of PUD w/ GIB in the 80's... eval by DrDBrodie in the past... prev on Zantac and Reglan but hasn't filled these since 2009... denies nausea, vomiting, heartburn, diarrhea, constipation, blood in stool, abdominal pain, swelling, gas... she is rec to use OTC Prilosec vs Zantac Prn & avoid Tums etc... ~  7/12:  GI eval by DrDBrodie for adult FTT, wt loss & difficulty swallowing (?dysphagia for solids/pills/ liqs vs really just refusing to swallow)> Given Megace (pt wouldn't take it due to smell & taste), & did EGD> reported neg & dilatation performed...   COLONIC POLYPS (ICD-211.3) - there is a pos family history of colon cancer (in brother)... last colonoscopy was 5/98 [facility]) showing diminutive polyp (tubular adenoma)... f/u planned 3-5 yrs, but she never went... she is overdue and needs to sched this important procedure but she has repeatedly refused GI referral...  RENAL  INSUFFICIENCY (ICD-588.9)  ~  labs 1/08 showed BUN= 21, Creat= 1.2 ~  labs 3/09 showed BUN= 32, Creat= 1.8 ~  labs 6/10 showed BUN= 46, Creat= 2.2.Marland KitchenMarland Kitchen rec to stop Maxzide (on 1/2 daily) ~  labs 7/10 showed BUN= 58, Creat= 1.6.Marland Kitchen. rec> incr fluid intake. ~  labs 5/11 showed BUN= 28, Creat= 1.5 ~  Labs 4/12 showed BUN=25, Creat=1.4 ~  Labs 7/12 showed BUN= 23, Creat= 1.3 ~  Labs 8/12 showed BUN= 23, Creat= 1.0 ~  Labs 12/12 showed BUN= 19, Creat= 1.1 ~  Labs 7/13 showed BUN= 15, Creat= 1.1 ~  Labs 4/14 showed BUN= 40, Creat= 1.4 & she is not on a diuretic; asked to incr oral fluid intake...  HEADACHE (ICD-784.0)  DEGENERATIVE JOINT DISEASE (ICD-715.90) - mod DJD and walks w/ crutches "because of my ankles"... she's seen [facility] in the past... started on low dose Pred 6/10 for this & Asthma> improved & she weaned off the Pred on her own... ~  6/10: CC= "my feet" c/o arthritic pain in feet & ankles... rec> trial Pred 10mg /d w/ ROV 34mo. ~  7/10: pt states improved on the Pred which has been weaned... ~  7/12:  She is in wheelchair> can't vs won't  stand ~  7/13:  Family states she's refused to stand, walk, exercise for ~80mo now; exam shows swollen tender right knee; we will incr Pred20mg /d & refer to Ortho. ~  4/14:  Family reports more of the same, and worsening FTT...  OSTEOPOROSIS (ICD-733.00) - currently taking ALENDRONATE 70mg /wk, no calcium supplement due to borderline hypercalcemia, MVI + Vit D 1000 u OTC supplement. ~  BMD 3/09 here showed TScores -2.9 in Spine, and -2.9 in right FemNeck... started on Alendronate 70mg /wk. ~  Vit D level 3/09 = 34... rec to start Vit D 1000 u OTC supplement. ~  Vit D level 6/10 = 50 ~  Vit D level 5/11 = 61 ~  5/12:  f/u BMD ==> TScores showed -2.8 in Spine, and -2.7 in bilat FemNecks> Continue Alendronate, MVI, Vit D.  ANXIETY (ICD-300.00) >> she has a mild dementia as well w/ odd affect etc; on ZOLOFT 50mg /d & seems improved per  family...  HEALTH MAINTENANCE: ~  GYN: she hasn't seen GYN (DrCollins) in yrs and needs PAP, Mammogram, etc (she has been reminded to set this up w/ each subseq visit here but declines to do so; asked to let us set this up for her but she declines our assistance). ~  GI:  last colonoscopy 1998 at [facility] by [facility]- sm adenoma removed; advised f/u w/ each subseq visit here but she declines GI follow up; referral made to [facility] for this important f/u procedure but she never went. ~  Immunizations:  had Pneumovax 2001... Tetanus (TDAP) given 5/11... she refuses the seasonal Flu vaccines & is reminded of the importance of these yearly vaccinations.   Past Surgical History  Procedure Laterality Date  . Tonsillectomy and adenoidectomy    . Appendectomy      Outpatient Encounter Prescriptions as of 01/04/2013  Medication Sig Dispense Refill  . aspirin 81 MG tablet Take 81 mg by mouth daily.        . Cholecalciferol (VITAMIN D) 1000 UNITS capsule Take 1,000 Units by mouth daily.        . ferrous sulfate 325 (65 FE) MG tablet Take 325 mg by mouth daily with breakfast.        . levothyroxine (SYNTHROID, LEVOTHROID) 75 MCG tablet take 1 tablet by mouth once daily  30 tablet  3  . lisinopril (PRINIVIL,ZESTRIL) 20 MG tablet Take 10 mg by mouth daily.        . Mometasone Furo-Formoterol Fum (DULERA) 200-5 MCG/ACT AERO Inhale 2 puffs into the lungs 2 (two) times daily.  1 Inhaler  11  . Multiple Vitamin (MULTIVITAMIN PO) Take 1 tablet by mouth daily.        . potassium chloride (K-DUR) 10 MEQ tablet Take 10 mEq by mouth daily.      . predniSONE (DELTASONE) 10 MG tablet TAKE 1 TABLET BY MOUTH ONCE DAILY IN THE MORNING  30 tablet  3  . PROAIR HFA 108 (90 BASE) MCG/ACT inhaler inhale 2 puffs by mouth every 6 hours if needed for wheezing  8.5 g  4  . sertraline (ZOLOFT) 50 MG tablet take 1 tablet by mouth once daily  30 tablet  5  . tiotropium (SPIRIVA HANDIHALER) 18 MCG inhalation capsule Place 1 capsule (18  mcg total) into inhaler and inhale daily.  30 capsule  11  . [DISCONTINUED] potassium chloride (K-DUR) 10 MEQ tablet Take 1 tablet (10 mEq total) by mouth 2 (two) times daily.  60 tablet  5  . alendronate (FOSAMAX)  70 MG tablet TAKE 1 TABLET BY MOUTH EVERY WEEK  4 tablet  5  . megestrol (MEGACE ORAL) 40 MG/ML suspension Take 1 teaspoon by mouth three times daily with meals  240 mL  prn  . theophylline (THEODUR) 300 MG 12 hr tablet TAKE 1 TABLET BY MOUTH DAILY  30 tablet  3  . [DISCONTINUED] lisinopril (PRINIVIL,ZESTRIL) 20 MG tablet take 1 tablet by mouth once daily  30 tablet  6  . [DISCONTINUED] Metformin HCl (RIOMET) 500 MG/5ML SOLN Take 1/2 teaspoon twice daily       . [DISCONTINUED] theophylline (THEODUR) 300 MG 12 hr tablet TAKE 1 TABLET BY MOUTH DAILY  30 tablet  6   No facility-administered encounter medications on file as of 01/04/2013.    Allergies  Allergen Reactions  . Amoxicillin-Pot Clavulanate     REACTION: rash, itching  . Cephalexin     REACTION: rash, itching  . Codeine     REACTION: rash, itching    Current Medications, Allergies, Past Medical History, Past Surgical History, Family History, and Social History were reviewed in [employer] record.    Review of Systems         See HPI - all other systems neg except as noted... The patient complains of difficulty walking, otherw she is very stoic & denies all symptoms.  The patient denies anorexia, fever, weight loss, weight gain, vision loss, hoarseness, chest pain, syncope, peripheral edema, prolonged cough, headaches, hemoptysis, abdominal pain, melena, hematochezia, severe indigestion/heartburn, hematuria, incontinence, muscle weakness, suspicious skin lesions, transient blindness, depression, unusual weight change, abnormal bleeding, enlarged lymph nodes, and angioedema.     Objective:   Physical Exam     WD, Thin, 79 y/o WF, chronically ill appearing, now weaker, in wheelchair, difficulty  standing unaided, & cachectic w/o focal localizing symptoms... GENERAL:  Alert & sl confused, less agitated & more alert... HEENT:  [place]/AT, EOM-wnl, PERRLA, EACs-clear, TMs-wnl, NOSE-clear, THROAT-clear & wnl. NECK:  Supple w/ fairROM; no JVD; normal carotid impulses w/ 1+ bilat bruits; no thyromegaly or nodules palpated; no lymphadenopathy. CHEST:  decr BS bilat, clear to P & A; without wheezes or rales; + scat rhonchi heard...  HEART:  Regular Rhythm; +gr1-2 sys murmur, no rubs or gallops detected... ABDOMEN:  Soft & nontender; normal bowel sounds; no organomegaly or masses palpated... EXT: without deformities, mild arthritic changes; no varicose veins/ venous insuffic/ or edema. She has bilat canes but unable to stand unaided... NEURO:  CN's intact;  no focal neuro deficits, diffusely weak... DERM:  No lesions noted; no rash etc...  RADIOLOGY DATA:  Reviewed in the EPIC EMR & discussed w/ the patient...  LABORATORY DATA:  Reviewed in the EPIC EMR & discussed w/ the patient...   Assessment & Plan:    ADULT FTT>  FTT persists despite all efforts by [place] & family;  Continue Megace, ensure, encouragement;  They are not optimistic that she will walk again...   ASTHMA>  on Pred10, Theo300-1/2Bid, Dulera2Bid, Spiriva, Proair prn; c/o recent exac w/ incr SOB, cough, wheezing; CXR-NAD and we decided to try Depo, incr Pred to 20mg /d & home NEB w/ XopenexTid.  HBP>  Controlled back on her low dose ACE & we will continue to monitor her BP as her intake (hopefully) improves; K= 4.1 on the elixir 1 tablesp daily==> they say she won't drink it anymore, therefore change to K10caps-2Qd...  Abn EKG/ CBruit>  She never went for prev 2DEcho/ CDopplers (ordered 5/11) & seemed oblivious to  recommendations; discussed w/ pt> stable on ASA 81mg /d & not interested in aggressive eval or treatments; She finally had CDopplers done 5/12 & they were OK= mild plaque & 0-39% ICA stenoses> rec continue ASA.  DM>  Off meds  since 7/13 & BS= 134 on diet alone; eating fair & rec to avoid sugar & sweets...   Hypothy>  We had prev decr the Synthroid75 to 1/2 tab daily but she's back on 1 tab now, clinically euthyroid, asked to continue same &   ?Hyperpara>  Prev Ca up to 11 and PTH= 80 & not progressive, therefore continue to watch carefully & REC no calc supplements etc; Ca= 10.3 currently & stable...  Hx Renal insuffic>  Creat is worse at 1.4 7 I suspect due to poor oral intake- asked to incr oral fluids...  DJD/ Osteoporosis>  Mod severe DJD & now stable on Pred; f/u BMD on the Fosamax ==> sl improved, continue same.  DEPRESSION/ ?Psychiatric illness/ Senile Dementia/ Adult FTT/ Weakness> Change in personality, mental status, mood, family relations, etc over the past several yrs; we discussed plan that REQUIRES her to take meds as outlined, take in adeq nutrition & Glucerna supplements, & drink fluids... On Zoloft 50mg /d & slowly improved...  Other medical issues as noted >> SED rate was elev at 67 ==> 22 on the Pred etc...   Patient's Medications  New Prescriptions   LEVALBUTEROL (XOPENEX) 0.63 MG/3ML NEBULIZER SOLUTION    Take 3 mLs (0.63 mg total) by nebulization 3 (three) times daily.   PREDNISONE (DELTASONE) 20 MG TABLET    Take 1 tablet (20 mg total) by mouth daily.  Previous Medications   ALENDRONATE (FOSAMAX) 70 MG TABLET    TAKE 1 TABLET BY MOUTH EVERY WEEK   ASPIRIN 81 MG TABLET    Take 81 mg by mouth daily.     CHOLECALCIFEROL (VITAMIN D) 1000 UNITS CAPSULE    Take 1,000 Units by mouth daily.     FERROUS SULFATE 325 (65 FE) MG TABLET    Take 325 mg by mouth daily with breakfast.     LEVOTHYROXINE (SYNTHROID, LEVOTHROID) 75 MCG TABLET    take 1 tablet by mouth once daily   LISINOPRIL (PRINIVIL,ZESTRIL) 20 MG TABLET    Take 10 mg by mouth daily.     MOMETASONE FURO-FORMOTEROL FUM (DULERA) 200-5 MCG/ACT AERO    Inhale 2 puffs into the lungs 2 (two) times daily.   MULTIPLE VITAMIN (MULTIVITAMIN PO)     Take 1 tablet by mouth daily.     PROAIR HFA 108 (90 BASE) MCG/ACT INHALER    inhale 2 puffs by mouth every 6 hours if needed for wheezing   SERTRALINE (ZOLOFT) 50 MG TABLET    take 1 tablet by mouth once daily   THEOPHYLLINE (THEODUR) 300 MG 12 HR TABLET    TAKE 1 TABLET BY MOUTH DAILY   TIOTROPIUM (SPIRIVA HANDIHALER) 18 MCG INHALATION CAPSULE    Place 1 capsule (18 mcg total) into inhaler and inhale daily.  Modified Medications   Modified Medication Previous Medication   MEGESTROL (MEGACE ORAL) 40 MG/ML SUSPENSION megestrol (MEGACE ORAL) 40 MG/ML suspension      Take 1 teaspoon by mouth three times daily with meals    Take 1 teaspoon by mouth three times daily with meals   POTASSIUM CHLORIDE (K-DUR) 10 MEQ TABLET potassium chloride (K-DUR) 10 MEQ tablet      Take 10 mEq by mouth daily.    Take 1 tablet (10 mEq  total) by mouth 2 (two) times daily.  Discontinued Medications   LISINOPRIL (PRINIVIL,ZESTRIL) 20 MG TABLET    take 1 tablet by mouth once daily   METFORMIN HCL (RIOMET) 500 MG/5ML SOLN    Take 1/2 teaspoon twice daily    PREDNISONE (DELTASONE) 10 MG TABLET    TAKE 1 TABLET BY MOUTH ONCE DAILY IN THE MORNING   THEOPHYLLINE (THEODUR) 300 MG 12 HR TABLET    TAKE 1 TABLET BY MOUTH DAILY

## 2013-01-07 ENCOUNTER — Telehealth: Payer: Self-pay | Admitting: Pulmonary Disease

## 2013-01-07 MED ORDER — LORAZEPAM 1 MG PO TABS: 0.5000 mg | ORAL_TABLET | Freq: Three times a day (TID) | ORAL | Status: AC | PRN

## 2013-01-07 NOTE — Telephone Encounter (Signed)
Per SN:  Lorazepam 1 mg 1/2-1 tab po tid prn anxiety # 90  -----  Rx called into [facility].  Daughter aware of recs and aware rx has been called in.  She verbalized understanding and voiced no further questions or concerns at this time.

## 2013-01-07 NOTE — Telephone Encounter (Signed)
Notes Recorded by Michele Mcalpine, MD on 01/04/2013 at 5:44 PM Please notify patient>  Chems show some mild renal insuffic- ?dehydrated?- needs to drink more fluids & be sure she is passing a good amt of urine... CBC is OK & her CXR was clear...      Spoke with pt's daughter and notified of results/recs per SN She verbalized understanding She states that something was supposed to have been called to pharm for the pt's nerves but nothing was sent  Please advise  Allergies  Allergen Reactions  . Amoxicillin-Pot Clavulanate     REACTION: rash, itching  . Cephalexin     REACTION: rash, itching  . Codeine     REACTION: rash, itching

## 2013-01-08 NOTE — Progress Notes (Signed)
Quick Note:  Pt aware per 4.14.14 phone note. ______ 

## 2013-01-08 NOTE — Progress Notes (Signed)
Quick Note:  Pt aware per 4.14.14 phone note. ______

## 2013-01-11 ENCOUNTER — Other Ambulatory Visit: Payer: Self-pay | Admitting: Pulmonary Disease

## 2013-01-14 ENCOUNTER — Encounter: Payer: Self-pay | Admitting: *Deleted

## 2013-02-13 ENCOUNTER — Ambulatory Visit: Payer: Medicare Other | Admitting: Pulmonary Disease

## 2013-02-15 ENCOUNTER — Encounter: Payer: Self-pay | Admitting: Pulmonary Disease

## 2013-03-04 ENCOUNTER — Other Ambulatory Visit: Payer: Self-pay | Admitting: Pulmonary Disease

## 2013-03-08 ENCOUNTER — Encounter: Payer: Self-pay | Admitting: Pulmonary Disease

## 2013-05-01 ENCOUNTER — Other Ambulatory Visit: Payer: Self-pay

## 2013-05-13 ENCOUNTER — Telehealth: Payer: Self-pay | Admitting: Pulmonary Disease

## 2013-05-13 NOTE — Telephone Encounter (Signed)
Pt's daughter states that they are having issues getting Dulera filled. States that her insurance company is stating that she needs to try another medication. Advised that I would get with Leigh to see if she knows anything about a PA. In the meantime I am going to leave samples up front for pick up.

## 2013-05-17 ENCOUNTER — Other Ambulatory Visit: Payer: Self-pay | Admitting: Pulmonary Disease

## 2013-06-08 ENCOUNTER — Other Ambulatory Visit: Payer: Self-pay | Admitting: Pulmonary Disease

## 2013-06-11 ENCOUNTER — Other Ambulatory Visit: Payer: Self-pay | Admitting: Pulmonary Disease

## 2013-06-11 MED ORDER — POTASSIUM CHLORIDE ER 10 MEQ PO TBCR: 10.0000 meq | EXTENDED_RELEASE_TABLET | Freq: Every day | ORAL | Status: AC

## 2013-06-14 ENCOUNTER — Telehealth: Payer: Self-pay | Admitting: Pulmonary Disease

## 2013-06-14 MED ORDER — AZITHROMYCIN 250 MG PO TABS: ORAL_TABLET | ORAL | Status: DC

## 2013-06-14 MED ORDER — METHYLPREDNISOLONE (PAK) 4 MG PO TABS: ORAL_TABLET | ORAL | Status: DC

## 2013-06-14 NOTE — Telephone Encounter (Signed)
Spoke with patients daughter-- She states patient has been using her inhalers much more,  going through her inhalers approximately every week Using nebs every 1-2 hours and is wanting nebs all the time Daughter states patient is having diff breathing-- however she is unsure if this mental or actually physical States she sometimes wonders if patient is addicted to her inhalers Would like recs per Dr. Kriste Basque, does patient need to be seen? Dr. Kriste Basque please advise, thank you!

## 2013-06-14 NOTE — Telephone Encounter (Signed)
Per SN: Z Pak and medrol dose pack  Spoke with Lupita Leash made Lupita Leash aware of above recs and that Rx has been sent to pharmacy Lupita Leash verbalized understanding and nothing further needed at this time

## 2013-06-14 NOTE — Telephone Encounter (Signed)
lmomtcb for donna to call me back to discuss pt

## 2013-06-23 ENCOUNTER — Telehealth: Payer: Self-pay | Admitting: Pulmonary Disease

## 2013-06-23 NOTE — Telephone Encounter (Signed)
Contacted by [facility] pharmacist - the patient requested new Albuterol inhaler on 9/14 (filled), 9/19 (filled) and again on 9/28.  The concern is for overuse.  Will forward to Dr. Kriste Basque to address.

## 2013-06-24 ENCOUNTER — Telehealth: Payer: Self-pay | Admitting: *Deleted

## 2013-06-24 MED ORDER — BUDESONIDE-FORMOTEROL FUMARATE 160-4.5 MCG/ACT IN AERO: 2.0000 | INHALATION_SPRAY | Freq: Two times a day (BID) | RESPIRATORY_TRACT | Status: DC

## 2013-06-24 NOTE — Telephone Encounter (Signed)
Called and spoke with donna, pts daughter.   She stated that the zpak and pred taper did help the pt some with her breathing.  She stated that the pt does not have the dulera, something was not going through on her insurance.   She does use the rescue inhaler and the spiriva.  Per SN---since her insurance will not cover the dulera, call in symbicort 160   2 sprays bid.  This has been sent to the pharmacy

## 2013-06-25 MED ORDER — FLUTICASONE-SALMETEROL 115-21 MCG/ACT IN AERO: 2.0000 | INHALATION_SPRAY | Freq: Two times a day (BID) | RESPIRATORY_TRACT | Status: DC

## 2013-06-25 NOTE — Telephone Encounter (Signed)
Received PA form back from the pharmacy that the symbicort is not covered by the pts insurance.  It stated that she must try advair diskus or advair hfa.  Per SN---call in advair hfa  115/21  2 puffs bid.  Rinse after each use.  This has been sent to the pharmacy

## 2013-06-27 NOTE — Telephone Encounter (Signed)
Called and spoke with donna and she is aware of the advair hfa that has been sent to the pharmacy for the pt.  Will sign off of this message.

## 2013-07-23 ENCOUNTER — Other Ambulatory Visit (INDEPENDENT_AMBULATORY_CARE_PROVIDER_SITE_OTHER): Payer: Medicare Other

## 2013-07-23 ENCOUNTER — Encounter: Payer: Self-pay | Admitting: Pulmonary Disease

## 2013-07-23 ENCOUNTER — Ambulatory Visit (INDEPENDENT_AMBULATORY_CARE_PROVIDER_SITE_OTHER): Payer: Medicare Other | Admitting: Pulmonary Disease

## 2013-07-23 VITALS — BP 132/84 | HR 84 | Temp 97.5°F

## 2013-07-23 DIAGNOSIS — E039 Hypothyroidism, unspecified: Secondary | ICD-10-CM

## 2013-07-23 DIAGNOSIS — R7309 Other abnormal glucose: Secondary | ICD-10-CM

## 2013-07-23 DIAGNOSIS — M1990 Unspecified osteoarthritis, unspecified site: Secondary | ICD-10-CM

## 2013-07-23 DIAGNOSIS — K219 Gastro-esophageal reflux disease without esophagitis: Secondary | ICD-10-CM

## 2013-07-23 DIAGNOSIS — R627 Adult failure to thrive: Secondary | ICD-10-CM

## 2013-07-23 DIAGNOSIS — D126 Benign neoplasm of colon, unspecified: Secondary | ICD-10-CM

## 2013-07-23 DIAGNOSIS — R531 Weakness: Secondary | ICD-10-CM

## 2013-07-23 DIAGNOSIS — N259 Disorder resulting from impaired renal tubular function, unspecified: Secondary | ICD-10-CM

## 2013-07-23 DIAGNOSIS — R0609 Other forms of dyspnea: Secondary | ICD-10-CM

## 2013-07-23 DIAGNOSIS — M810 Age-related osteoporosis without current pathological fracture: Secondary | ICD-10-CM

## 2013-07-23 DIAGNOSIS — R7303 Prediabetes: Secondary | ICD-10-CM

## 2013-07-23 DIAGNOSIS — R0600 Dyspnea, unspecified: Secondary | ICD-10-CM

## 2013-07-23 DIAGNOSIS — J45909 Unspecified asthma, uncomplicated: Secondary | ICD-10-CM

## 2013-07-23 DIAGNOSIS — E213 Hyperparathyroidism, unspecified: Secondary | ICD-10-CM

## 2013-07-23 DIAGNOSIS — I10 Essential (primary) hypertension: Secondary | ICD-10-CM

## 2013-07-23 DIAGNOSIS — R1310 Dysphagia, unspecified: Secondary | ICD-10-CM

## 2013-07-23 DIAGNOSIS — R0989 Other specified symptoms and signs involving the circulatory and respiratory systems: Secondary | ICD-10-CM

## 2013-07-23 DIAGNOSIS — F411 Generalized anxiety disorder: Secondary | ICD-10-CM

## 2013-07-23 LAB — HEPATIC FUNCTION PANEL
ALT: 18 U/L (ref 0–35)
AST: 19 U/L (ref 0–37)
Albumin: 3.8 g/dL (ref 3.5–5.2)
Bilirubin, Direct: 0.1 mg/dL (ref 0.0–0.3)
Total Bilirubin: 1.1 mg/dL (ref 0.3–1.2)
Total Protein: 7.4 g/dL (ref 6.0–8.3)

## 2013-07-23 LAB — CBC WITH DIFFERENTIAL/PLATELET
Basophils Absolute: 0.1 10*3/uL (ref 0.0–0.1)
Basophils Relative: 0.5 % (ref 0.0–3.0)
Eosinophils Absolute: 0.9 10*3/uL — ABNORMAL HIGH (ref 0.0–0.7)
Lymphocytes Relative: 24.1 % (ref 12.0–46.0)
MCHC: 33.7 g/dL (ref 30.0–36.0)
MCV: 84.3 fl (ref 78.0–100.0)
Monocytes Absolute: 0.5 10*3/uL (ref 0.1–1.0)
Neutrophils Relative %: 62 % (ref 43.0–77.0)
Platelets: 281 10*3/uL (ref 150.0–400.0)
RDW: 14.5 % (ref 11.5–14.6)
WBC: 11.1 10*3/uL — ABNORMAL HIGH (ref 4.5–10.5)

## 2013-07-23 LAB — TSH: TSH: 5.11 u[IU]/mL (ref 0.35–5.50)

## 2013-07-23 LAB — BASIC METABOLIC PANEL
CO2: 25 mEq/L (ref 19–32)
Chloride: 106 mEq/L (ref 96–112)
Creatinine, Ser: 1.3 mg/dL — ABNORMAL HIGH (ref 0.4–1.2)
Potassium: 3.8 mEq/L (ref 3.5–5.1)

## 2013-07-23 MED ORDER — LEVALBUTEROL HCL 0.63 MG/3ML IN NEBU: 1.0000 | INHALATION_SOLUTION | Freq: Four times a day (QID) | RESPIRATORY_TRACT | Status: DC

## 2013-07-23 NOTE — Progress Notes (Signed)
Subjective:    Patient ID: Ashley Norman, female    DOB: 09-Sep-1928, 79 y.o.   MRN: [id number]  HPI 79 y/o WF here for a follow up visit... she has mult med problems as listed below...   ~  November 28, 2011:  64mo ROV & Ashley Norman has really "fallen off" not eating well per family, trying to take Ensure but obviously not enough her wt is down 10# to 103# today;  We discussed restarting MEGACE for appetite & careful supervision of meals for encouragement, food prefs, etc and continue the supplements;  She is still very negative & contrary> clearly not exercising or even moving much & appears to have a good bit of musc atrophy> she refuses formal PT & wouldn't allow home therapy in the past; says she knows what to do just doesn't want to do it & want to be left alone to sleep as much as she can; on Zoloft but still asking for Ashley Norman; offered Psychiatric referral but they decline & prefer to continue conservative supportive therapy & comfort measures...    Last OV we added Spiriva + Pred 10mg /d and Mucinex 1-2Bid; she has had monthly antibiotic calls w/ ZPak the Levaquin called in for bronchitic exac; chest remains sl congested w/ rhonchi bilat & they are encouraged to use the Mucinex 2Bid + Fluids and we decided to change the Flovent/ Proair to DULERA200- 2spBid + Proair rescue..    BP is low at 94/60 w/ her wt loss on Lisinopril 10mg /d; asked to monitor BP at home & hold Lisinopril if BP<110 sys...  Prev LABS & XRays are reviewed...  ~  April 16, 2012:  4-50mo ROV & family reports that she has refused to get up out of chair or try to stand for ~80mo now; weaker, can't get weight, asked to take Megace & Ensure supplements regularly;  They note she has painful right knee & this may be part of the reason she won't get up> on Pred 10mg /d but only using Tylenol for pain; exam shows knee sl swollen , warm, tender> we will incr Pred to 20mg /d for 10d & refer to Ashley Norman for shot in the knee for relief;   Offered home PT but she declines & children will let me know...    We reviewed prob list, meds, xrays and labs> see below for updates >>  LABS 7/13:  Chems- wnl w/ BS=92 A1c=5.6;  CBC- wnl w/ Hg=12.1;  TSH=3.97  ~  January 04, 2013:  32mo ROV & Ashley Norman continues to deteriorate according to her family- less cooperation, less mobile, needs help getting up & about/ bathing/ toileting/ etc; In addition she's had incr SOB w/ some cough, wheezing, & "hard breathing"; they are wondering about home Oxygen but her RA resting O2 sat= 92% & as noted she is not active (we will order ONO to check nocturnal sats);  We gave her a NEB treatment in office w/ some benmefit & will arrange for a Home NEB unit + XOPENEX Tid; & incr her Pred to 20mg /d...     Asthma w/ exac> on Pred10, Theo300-1/2Bid, Dulera2Bid, Spiriva, Proair prn; c/o recent exac w/ incr SOB, cough, wheezing; CXR-NAD and we decided to try Depo, incr Pred to 20mg /d & home NEB w/ XopenexTid...    HBP> on Lisinopril10 + K10; BP=122/80 & she denies CP, palpit, syncope, edema; she is SOB due to the AB exac...    CBruit> on ASA 81; she denies cerebral ischemic symptoms.Marland KitchenMarland Kitchen  DM> off Riomet, on diet alone;  Labs today showed BS= 134; asked to incr oral fluids & attention to nutrition...    Hypothy> ?on Levothyroid60mcg- 1/2 or 1 daily?... TSH 7/13 = 3.97 (they did not bring med list or bottles to this OV).Marland KitchenMarland Kitchen    ?Primary Hyperpara> last several calcium readings have been wnl...    GERD/ Polyps> she has trouble swallowing big pills; eating soft foods ok; they note appetite fair & she can't stand unsupported for a weight; asked to eat 3 meals/d & take Ensure between meals as supplement...    DJD/ Osteoporosis> on Fosamax weekly + MVI/ VitD1000u; ? of compliance but family is supposed to be supervising all meds...    Dementia/ Anxiety> on Zoloft50; she has a senile dementia, less cooperative, family is committed to home care, not yet ready for hospice consideration...     Adult FTT> the sum total of all her problems & her inability to cooperate & assist in her own care/ decisions... We reviewed prob list, meds, xrays and labs> see below for updates >>   CXR 4/14 showed normal heart size, hyperexpanded, clear lungs, NAD...  LABS 4/14:  Chems- ok x BS=134, BUN=40, Cr=1.4;  CBC- ok w/ Hg=15.6, Wbc=11.7...  ~  July 23, 2013:  6mo ROV & family states that she has persistent symptoms- incr SOB, ?pressure/ pain in chest present all the time (NEBS help); using NEBS Tid, AdvairHFA Bid & Spiriva daily; still on Prednisone 20mg /d (she was to ret after 17mo & never followed up- family kept her on the Pred20) & Theodur300mg /d; but overall they feel she is stable & they decline further eval w/ Cards etc... Rec to continue same & incr Xopenex NEBS to Qid if needed...     BP is stable on Lisin10 & measures 132/84 today...    She remains on ASA 81mg /d & they deny cerebral ischemic symptoms...    She remains on Megace for appetite; could not weigh pt- in wheelchair & unable to stand; they decline home PT etc...    She appears clinically euthyroid on her Synthroid75-1/2 tab daily & TSH= 5.11...    She remains on Ativan & Zoloft for depression & nerves; she remains difficult for family to manage & they prefer the status quo... We reviewed prob list, meds, xrays and labs> see below for updates >>   LABS 3/15:  Chems- ok w/ BS=124, BUN=30, Creat=1.3;  CBC- wnl;  TSH=5.11;  BNP=37...          Problem List:    ADULT FTT >> see above  ASTHMA (ICD-493.90) - she is a non-smoker> Prev on TheoDur 300mg /d, PROAIR HFA 2spBid &Prn, FLOVENT 220 2spBid;  She stopped all of her meds on her own in June2012 (?why); then we restarted her Rx as below>> ~  baseline CXR w/ hyperinflation, tort Ao, NAD... ~  4/12:  Notes some cough, congestion, intermittent wheezing> on Theo/ Proair/ Flovent;  Rec- Mucinex/ Fluids... ~  CXR 4/12 showed ectatic Ao, hyperinflation c/w emphysema, clear/ NAD.Marland Kitchen. ~   7/12:  We decided to treat w/ PRED 10mg  Bid (weaned to 10mg /d) + PROAIR prn wheezing... ~  12/12: she presented w/ a mild AB exac; family needs to control all her meds; we added ZPak, Spiriva, Mucinex, Fluids... ~  CXR 12/12 showed hyperinflation but clear, osteopenia, NAD... ~  3/13:  We adjusted meds to DULERA200-2spBid, SPIRIVA-once daily, PRED 10mg /d, THEODUR 300mg /d, PROAIR-prn, & MUCINEX-2Bid w/ fluids... ~  4/14: on Pred10, Theo300-1/2Bid, Dulera2Bid, Spiriva, Proair  prn; c/o recent exac w/ incr SOB, cough, wheezing; CXR-NAD and we decided to try Depo, incr Pred to 20mg /d & home NEB w/ XopenexTid. ~  10/14: she did not ret in 76mo as requested; family kept her on Pred20, AdvairHFA115, Spiriva, Theo300, & XopenexNEBS Tid; stable now but has nocturnal exac & ok to incr Xopenex to qid...  HYPERTENSION (ICD-401.9) - back on LISINOPRIL 10mg /d (20mg  tab- 1/2); she had stopped all of her meds on her own June2012>  ~  cardiac cath 1991 was neg- norm coronaries (she may have had spasm). ~  labs 3/09 showed K=3.8, BUN=32, Creat=1.8 ~  labs 6/10 showed K=4.4, BUN=46, Creat=2.2.Marland Kitchen. rec> stop Maxzide & KCl, ROV 76mo. ~  labs 7/10 showed K=4.6, BUN=58, Creat=1.6.Marland Kitchen. rec> incr fluid intake, stay off Maxzide. ~  5/11:  BP=138/72, tol Rx well, no problems; denies HA, visual changes, CP, palipit, syncope, ch in dyspnea, etc; notes tr edema intermittently. ~  4/12:  BP= 138/84 & she remains essentially asymptomatic; K=3.9, BUN=25, Creat=1.4 ~  7/12:  BP= 122/82 off meds but poor intake, weak, wt loss, etc; K=3.3, BUN=23, Creat=1.3> rec K10caps two daily. ~  8/12:  BP= 110/72 back on Lisinopril 10mg /d due to incr BP at home; K=4.1 on 1tablesp KCL elixir daily. ~  12/12:  BP= 110/68 on Lisinopril monotherapy... Note: BNP= 61. ~  3/13:  BP= 94/60 on Lisin10 & asked to monitor BP at home & hold if BP<110 sys... ~  7/13:  BP= 142/82 on Lisin10 & asked to continue daily med Rx... ~  4/14: on Lisinopril10 + K10;  BP=122/80 & she denies CP, palpit, syncope, edema; she is SOB due to the AB exac. ~  10/14: BP is stable on Lisin10 & measures 132/84 today...  ABNORMAL ELECTROCARDIOGRAM (ICD-794.31) - EKG shows NSR, IVCD, LAD, poor R progression... ~  2DEcho 5/11 ==> ordered but pt never returned to have this done...  CAROTID BRUIT (ICD-785.9) - back on ASA 81mg /d> she denies cerebral ischemic symptoms... ~  CDopplers 5/11 ==> ordered but pt never returned to have this done... ~  CDopplers completed 5/12 & showed mild plaque in bulbs, 0-39% bilat ICA stenoses, antegrade vertebral flow.  DIABETES MELLITUS (ICD-250.00) - back on METFORMIN but taking RIOMET 500mg /59ml (1/2 tsp Bid) & appeared to tolerate well... ~  labs 1/08 showed BS=172, HgA1c=6.5 & Metformin 500mg /d started... ~  labs 3/09 showed BS= 99, A1c= 5.7 ~  labs 6/10 showed BS= 138, A1c= 6.0.Marland Kitchen. rec> same med. ~  labs 5/11 (wt=142#) showed BS= 141, A1c= 5.8 ~  Labs 4/12 (wt=129#) showed BS= 100, A1c= 5.9.Marland KitchenMarland Kitchen rec to decr Metformin to 1/2 tab each AM. ~  Labs 7/12 (wt=?<120#) & off all meds for 1+mo showed BS=77, A1c=6.2; rec continue MetformER- 1/2Qam (pt on Pred). ~  8/12:  Pt switched to Liq RIOMET 1/2 tsp Bid;  Labs showed BS= 116 ~  Labs 12/12 on Riomet-1/2tspBid showed BS= 134 ~  Labs 7/13 on Riomet showed BS= 92, a1c= 5.6.Marland KitchenMarland Kitchen rec to STOP Riomet & Rx w/ diet alone for now... ~  4/14: off Riomet, on diet alone;  Labs today showed BS= 134; asked to incr oral fluids & attention to nutrition... ~  10/14: on Megace for appetite, BS=124, A1c wasn't done...  HYPOTHYROIDISM (ICD-244.9) - prev on LEVOTHYROID 75 micrograms/d (w/ good energy & clinically euthyroid); she stopped all meds on her own 6/12; we restarted LEVOTHYROID - 1/2 tab daily 7/12... ~  labs 1/08 showed TSH=1.84 on 75 micrograms/d.Marland KitchenMarland Kitchen ~  labs 3/09 showed TSH= 5.05 ~  labs 6/10 showed TSH= 1.27 ~  labs 5/11 showed TSH= 0.64 ~  Labs 4/12 on Levo75 showed TSH= 1.61 ~  Labs 7/12 off  meds showed TSH= 5.40 & rec to restart 1/2 of the tab daily. ~  Labs 8/12 on Levo75-1/2 showed TSH= 3.47 & rec to continue same. ~  Labs 12/12 on Levothy75-1/2 showed TSH= 4.87 ~  Labs 7/13 on Levothy75-1/2 showed TSH= 3.97 ~  4/14: Med list indicates Levothyroid - ?taking 1 tab daily? (they did not bring med bottles or list today)... ~  10/14: on Synthroid75-1/2 tab daily; Labs 0/14 showed TSH=5.11  ? of PRIMARY HYPERPARATHYROIDISM (ICD-252.01) - Ca++ levels in the 10.6-11.0 range... PO4- levels in the 2.1-2.4 range... PTH levels slowly rising 40-65... therefore following clinically w/ serial labs >> ~  labs 1/08 showed:  Ca++ 11.0, PTH= 42.4.Marland KitchenMarland KitchenMarland Kitchen ~  labs 3/09 showed Ca= 10.3, PO4= 3.2, PTH= 50 ~  labs 6/10 showed Ca= 10.9, PO4= 3.7, PTH= 65 ~  labs 5/11 showed Ca= 10.5-10.9, Phos= 3.0, PTH= 111... rec> continue to follow closely. ~  Labs 4/12 showed Ca= 11.0, PO4= not done, PTH= 80... NOT progressive- rec no calc supplements, incr fluids. ~  Labs 7/12 & 8/12 showed Ca= 9.9 ~  Labs 12/12 showed Ca= 10.2 ~  Labs 7/13 showed Ca= 9.9 ~  Labs 4/14 showed Ca= 10.3 ~  Labs 10/14 showed Ca= 10.3  GERD (ICD-530.81) - remote hx of PUD w/ GIB in the 80's... eval by DrDBrodie in the past... prev on Zantac and Reglan but hasn't filled these since 2009... denies nausea, vomiting, heartburn, diarrhea, constipation, blood in stool, abdominal pain, swelling, gas... she is rec to use OTC Prilosec vs Zantac Prn & avoid Tums etc... ~  7/12:  GI eval by DrDBrodie for adult FTT, wt loss & difficulty swallowing (?dysphagia for solids/pills/ liqs vs really just refusing to swallow)> Given Megace (pt wouldn't take it due to smell & taste), & did EGD> reported neg & dilatation performed...   COLONIC POLYPS (ICD-211.3) - there is a pos family history of colon cancer (in brother)... last colonoscopy was 5/98 [facility]) showing diminutive polyp (tubular adenoma)... f/u planned 3-5 yrs, but she never went... she  is overdue and needs to sched this important procedure but she has repeatedly refused GI referral...  RENAL INSUFFICIENCY (ICD-588.9)  ~  labs 1/08 showed BUN= 21, Creat= 1.2 ~  labs 3/09 showed BUN= 32, Creat= 1.8 ~  labs 6/10 showed BUN= 46, Creat= 2.2.Marland KitchenMarland Kitchen rec to stop Maxzide (on 1/2 daily) ~  labs 7/10 showed BUN= 58, Creat= 1.6.Marland Kitchen. rec> incr fluid intake. ~  labs 5/11 showed BUN= 28, Creat= 1.5 ~  Labs 4/12 showed BUN=25, Creat=1.4 ~  Labs 7/12 showed BUN= 23, Creat= 1.3 ~  Labs 8/12 showed BUN= 23, Creat= 1.0 ~  Labs 12/12 showed BUN= 19, Creat= 1.1 ~  Labs 7/13 showed BUN= 15, Creat= 1.1 ~  Labs 4/14 showed BUN= 40, Creat= 1.4 & she is not on a diuretic; asked to incr oral fluid intake... ~  Labs 10/14 showed BUN= 30, Creat= 1.3  HEADACHE (ICD-784.0)  DEGENERATIVE JOINT DISEASE (ICD-715.90) - mod DJD and walks w/ crutches "because of my ankles"... she's seen [facility] in the past... started on low dose Pred 6/10 for this & Asthma> improved & she weaned off the Pred on her own... ~  6/10: CC= "my feet" c/o arthritic pain in feet & ankles... rec> trial Pred  10mg /d w/ ROV 24mo. ~  7/10: pt states improved on the Pred which has been weaned... ~  7/12:  She is in wheelchair> can't vs won't stand ~  7/13:  Family states she's refused to stand, walk, exercise for ~20mo now; exam shows swollen tender right knee; we will incr Pred20mg /d & refer to Norman. ~  4/14:  Family reports more of the same, and worsening FTT...  OSTEOPOROSIS (ICD-733.00) - currently taking ALENDRONATE 70mg /wk, no calcium supplement due to borderline hypercalcemia, MVI + Vit D 1000 u OTC supplement. ~  BMD 3/09 here showed TScores -2.9 in Spine, and -2.9 in right FemNeck... started on Alendronate 70mg /wk. ~  Vit D level 3/09 = 34... rec to start Vit D 1000 u OTC supplement. ~  Vit D level 6/10 = 50 ~  Vit D level 5/11 = 61 ~  5/12:  f/u BMD ==> TScores showed -2.8 in Spine, and -2.7 in bilat FemNecks> Continue  Alendronate, MVI, Vit D.  ANXIETY (ICD-300.00) >> she has a mild dementia as well w/ odd affect etc; on ZOLOFT 50mg /d & seems improved per family...  HEALTH MAINTENANCE: ~  GYN: she hasn't seen GYN (DrCollins) in yrs and needs PAP, Mammogram, etc (she has been reminded to set this up w/ each subseq visit here but declines to do so; asked to let us set this up for her but she declines our assistance). ~  GI:  last colonoscopy 1998 at [facility] by [facility]- sm adenoma removed; advised f/u w/ each subseq visit here but she declines GI follow up; referral made to [facility] for this important f/u procedure but she never went. ~  Immunizations:  had Pneumovax 2001... Tetanus (TDAP) given 5/11... she refuses the seasonal Flu vaccines & is reminded of the importance of these yearly vaccinations.   Past Surgical History  Procedure Laterality Date  . Tonsillectomy and adenoidectomy    . Appendectomy      Outpatient Encounter Prescriptions as of 07/23/2013  Medication Sig Dispense Refill  . alendronate (FOSAMAX) 70 MG tablet TAKE 1 TABLET BY MOUTH EVERY WEEK  4 tablet  5  . aspirin 81 MG tablet Take 81 mg by mouth daily.        Marland Kitchen azithromycin (ZITHROMAX Z-PAK) 250 MG tablet Take as directed  6 each  0  . Cholecalciferol (VITAMIN D) 1000 UNITS capsule Take 1,000 Units by mouth daily.        . ferrous sulfate 325 (65 FE) MG tablet Take 325 mg by mouth daily with breakfast.        . fluticasone-salmeterol (ADVAIR HFA) 115-21 MCG/ACT inhaler Inhale 2 puffs into the lungs 2 (two) times daily. Rinse after each use.  1 Inhaler  6  . levalbuterol (XOPENEX) 0.63 MG/3ML nebulizer solution Take 3 mLs (0.63 mg total) by nebulization 3 (three) times daily.  270 mL  5  . levothyroxine (SYNTHROID, LEVOTHROID) 75 MCG tablet take 1 tablet by mouth once daily  30 tablet  3  . lisinopril (PRINIVIL,ZESTRIL) 20 MG tablet Take 10 mg by mouth daily.        Marland Kitchen LORazepam (ATIVAN) 1 MG tablet Take 0.5-1 tablets (0.5-1 mg total) by  mouth 3 (three) times daily as needed for anxiety.  90 tablet  0  . megestrol (MEGACE ORAL) 40 MG/ML suspension Take 1 teaspoon by mouth three times daily with meals  240 mL  prn  . Multiple Vitamin (MULTIVITAMIN PO) Take 1 tablet by mouth daily.        Marland Kitchen  potassium chloride (K-DUR) 10 MEQ tablet Take 1 tablet (10 mEq total) by mouth daily.  30 tablet  6  . predniSONE (DELTASONE) 20 MG tablet Take 1 tablet (20 mg total) by mouth daily.  30 tablet  11  . PROAIR HFA 108 (90 BASE) MCG/ACT inhaler inhale 2 puffs by mouth every 6 hours if needed for wheezing  8.5 g  4  . sertraline (ZOLOFT) 50 MG tablet take 1 tablet by mouth once daily  30 tablet  5  . theophylline (THEODUR) 300 MG 12 hr tablet TAKE 1 TABLET BY MOUTH DAILY  30 tablet  3  . tiotropium (SPIRIVA HANDIHALER) 18 MCG inhalation capsule Place 1 capsule (18 mcg total) into inhaler and inhale daily.  30 capsule  11  . [DISCONTINUED] methylPREDNIsolone (MEDROL DOSPACK) 4 MG tablet follow package directions  21 tablet  0   No facility-administered encounter medications on file as of 07/23/2013.    Allergies  Allergen Reactions  . Amoxicillin-Pot Clavulanate     REACTION: rash, itching  . Cephalexin     REACTION: rash, itching  . Codeine     REACTION: rash, itching    Current Medications, Allergies, Past Medical History, Past Surgical History, Family History, and Social History were reviewed in [employer] record.    Review of Systems         See HPI - all other systems neg except as noted... The patient complains of difficulty walking, otherw she is very stoic & denies all symptoms.  The patient denies anorexia, fever, weight loss, weight gain, vision loss, hoarseness, chest pain, syncope, peripheral edema, prolonged cough, headaches, hemoptysis, abdominal pain, melena, hematochezia, severe indigestion/heartburn, hematuria, incontinence, muscle weakness, suspicious skin lesions, transient blindness, depression,  unusual weight change, abnormal bleeding, enlarged lymph nodes, and angioedema.     Objective:   Physical Exam     WD, Thin, 79 y/o WF, chronically ill appearing, now weaker, in wheelchair, difficulty standing unaided, & cachectic w/o focal localizing symptoms... GENERAL:  Alert & sl confused, less agitated & more alert... HEENT:  [place]/AT, EOM-wnl, PERRLA, EACs-clear, TMs-wnl, NOSE-clear, THROAT-clear & wnl. NECK:  Supple w/ fairROM; no JVD; normal carotid impulses w/ 1+ bilat bruits; no thyromegaly or nodules palpated; no lymphadenopathy. CHEST:  decr BS bilat, clear to P & A; without wheezes or rales; + scat rhonchi heard...  HEART:  Regular Rhythm; +gr1-2 sys murmur, no rubs or gallops detected... ABDOMEN:  Soft & nontender; normal bowel sounds; no organomegaly or masses palpated... EXT: without deformities, mild arthritic changes; no varicose veins/ venous insuffic/ or edema. She has bilat canes but unable to stand unaided... NEURO:  CN's intact;  no focal neuro deficits, diffusely weak... DERM:  No lesions noted; no rash etc...  RADIOLOGY DATA:  Reviewed in the EPIC EMR & discussed w/ the patient...  LABORATORY DATA:  Reviewed in the EPIC EMR & discussed w/ the patient...   Assessment & Plan:    ADULT FTT>  FTT persists despite all efforts by [place] & family;  Continue Megace, ensure, encouragement;  They are not optimistic that she will walk again...   ASTHMA>  on Pred20, Theo300-1/2Bid, AdvairHFA115-2Bid, Spiriva, Xopenex NEBS Tid, Proair prn; she has persist symptoms & they are encouraged to use Rx regularly & ok to incr Xopenex to Qid...  HBP>  Controlled back on her low dose ACE & we will continue to monitor her BP as her intake (hopefully) improves; K= 4.1 on the elixir 1 tablesp daily==> they  say she won't drink it anymore, therefore change to K10caps-2Qd...  Abn EKG/ CBruit>  She never went for prev 2DEcho/ CDopplers (ordered 5/11) & seemed oblivious to recommendations;  discussed w/ pt> stable on ASA 81mg /d & not interested in aggressive eval or treatments; She finally had CDopplers done 5/12 & they were OK= mild plaque & 0-39% ICA stenoses> rec continue ASA.  DM>  Off meds since 7/13 & BS= 124 on diet alone; eating fair & rec to avoid sugar & sweets...   Hypothy>  We had prev decr the Synthroid75 to 1/2 tab daily but she's back on 1 tab now, clinically euthyroid, asked to continue same &   ?Hyperpara>  Prev Ca up to 11 and PTH= 80 & not progressive, therefore continue to watch carefully & REC no calc supplements etc; Ca= 10.3 currently & stable...  Hx Renal insuffic>  Creat is stable at 1.3 & I suspect due to poor oral intake- asked to incr oral fluids...  DJD/ Osteoporosis>  Mod severe DJD & now stable on Pred; f/u BMD on the Fosamax ==> sl improved, continue same.  DEPRESSION/ ?Psychiatric illness/ Senile Dementia/ Adult FTT/ Weakness> Change in personality, mental status, mood, family relations, etc over the past several yrs; we discussed plan that REQUIRES her to take meds as outlined, take in adeq nutrition & Glucerna supplements, & drink fluids... On Zoloft 50mg /d & slowly improved...  Other medical issues as noted >> SED rate was elev at 67 ==> 22 on the Pred etc...   Patient's Medications  New Prescriptions   LEVOFLOXACIN (LEVAQUIN) 500 MG TABLET    Take 1 tablet (500 mg total) by mouth daily.   MOMETASONE-FORMOTEROL (DULERA) 200-5 MCG/ACT AERO    Inhale 2 puffs into the lungs 2 (two) times daily.  Previous Medications   ALENDRONATE (FOSAMAX) 70 MG TABLET    TAKE 1 TABLET BY MOUTH EVERY WEEK   ASPIRIN 81 MG TABLET    Take 81 mg by mouth daily.     CHOLECALCIFEROL (VITAMIN D) 1000 UNITS CAPSULE    Take 1,000 Units by mouth daily.     FERROUS SULFATE 325 (65 FE) MG TABLET    Take 325 mg by mouth daily with breakfast.     FLUTICASONE-SALMETEROL (ADVAIR HFA) 115-21 MCG/ACT INHALER    Inhale 2 puffs into the lungs 2 (two) times daily. Rinse after each  use.   LISINOPRIL (PRINIVIL,ZESTRIL) 20 MG TABLET    Take 10 mg by mouth daily.     LORAZEPAM (ATIVAN) 1 MG TABLET    Take 0.5-1 tablets (0.5-1 mg total) by mouth 3 (three) times daily as needed for anxiety.   MEGESTROL (MEGACE ORAL) 40 MG/ML SUSPENSION    Take 1 teaspoon by mouth three times daily with meals   MULTIPLE VITAMIN (MULTIVITAMIN PO)    Take 1 tablet by mouth daily.     POTASSIUM CHLORIDE (K-DUR) 10 MEQ TABLET    Take 1 tablet (10 mEq total) by mouth daily.   PREDNISONE (DELTASONE) 20 MG TABLET    Take 1 tablet (20 mg total) by mouth daily.   SERTRALINE (ZOLOFT) 50 MG TABLET    take 1 tablet by mouth once daily  Modified Medications   Modified Medication Previous Medication   LEVALBUTEROL (XOPENEX) 0.63 MG/3ML NEBULIZER SOLUTION levalbuterol (XOPENEX) 0.63 MG/3ML nebulizer solution      Take 3 mLs (0.63 mg total) by nebulization 4 (four) times daily.    Take 3 mLs (0.63 mg total) by nebulization 3 (three)  times daily.   LEVOTHYROXINE (SYNTHROID, LEVOTHROID) 75 MCG TABLET levothyroxine (SYNTHROID, LEVOTHROID) 75 MCG tablet      take 1/2  tablet by mouth once daily    take 1 tablet by mouth once daily   PROAIR HFA 108 (90 BASE) MCG/ACT INHALER PROAIR HFA 108 (90 BASE) MCG/ACT inhaler      inhale 2 puffs by mouth every 6 hours if needed for wheezing    inhale 2 puffs by mouth every 6 hours if needed for wheezing   SPIRIVA HANDIHALER 18 MCG INHALATION CAPSULE tiotropium (SPIRIVA HANDIHALER) 18 MCG inhalation capsule      inhale contents of 1 capsule by mouth once daily    Place 1 capsule (18 mcg total) into inhaler and inhale daily.   THEOPHYLLINE (THEODUR) 300 MG 12 HR TABLET theophylline (THEODUR) 300 MG 12 hr tablet      take 1 tablet by mouth once daily    TAKE 1 TABLET BY MOUTH DAILY  Discontinued Medications   AZITHROMYCIN (ZITHROMAX Z-PAK) 250 MG TABLET    Take as directed   METHYLPREDNISOLONE (MEDROL DOSPACK) 4 MG TABLET    follow package directions   PROAIR HFA 108 (90 BASE)  MCG/ACT INHALER    inhale 2 puffs by mouth every 6 hours if needed for wheezing

## 2013-07-23 NOTE — Patient Instructions (Signed)
Today we updated your med list in our EPIC system...    Continue your current medications the same...  We increased the Xopenex for NEBULIZER to 4 times daily- the 4th treatment can be set up for middle of the night if she needs it...  Continue the Prednisone, Theodur, Advair/ Spiriva, and her NEBS/ Proair as we discussed...  Try the Ativan (Lorazepam) 1mg  at 1/2 to 1 tab (up to 3 whole tabs daily as we discussed)...  Today we did your follow up blood work...    We will contact you w/ the results when available...   Call for any questions...  Let's plan a follow up visit in 38mo, sooner if needed for problems.Marland KitchenMarland Kitchen

## 2013-07-26 ENCOUNTER — Other Ambulatory Visit: Payer: Self-pay | Admitting: Pulmonary Disease

## 2013-08-01 ENCOUNTER — Other Ambulatory Visit: Payer: Self-pay

## 2013-08-03 ENCOUNTER — Other Ambulatory Visit: Payer: Self-pay | Admitting: Pulmonary Disease

## 2013-08-26 ENCOUNTER — Telehealth: Payer: Self-pay | Admitting: Pulmonary Disease

## 2013-08-26 MED ORDER — MOMETASONE FURO-FORMOTEROL FUM 200-5 MCG/ACT IN AERO: 2.0000 | INHALATION_SPRAY | Freq: Two times a day (BID) | RESPIRATORY_TRACT | Status: DC

## 2013-08-26 NOTE — Telephone Encounter (Signed)
I called and spoke with Ashley Norman. She reports the dulera 200 mcg will cost pt $44 and the adviar costs pt $248. Please advise SN thanks

## 2013-08-26 NOTE — Telephone Encounter (Signed)
Per SN---  Ok to change to dulera 200  2 sprays bid.  thanks

## 2013-10-17 ENCOUNTER — Telehealth: Payer: Self-pay | Admitting: Pulmonary Disease

## 2013-10-17 MED ORDER — LEVOFLOXACIN 500 MG PO TABS: 500.0000 mg | ORAL_TABLET | Freq: Every day | ORAL | Status: DC

## 2013-10-17 NOTE — Telephone Encounter (Signed)
Spoke with Butch Penny She states pt has been c/o sinus pressure/HA and cough (prod with min sputum, unsure color) x 2 days  She fears has a sinus infection  No f/c/s, sore throat, dyspnea or other co's  Pt last seen here 07/23/13  Please advise thanks! Allergies  Allergen Reactions  . Amoxicillin-Pot Clavulanate     REACTION: rash, itching  . Cephalexin     REACTION: rash, itching  . Codeine     REACTION: rash, itching

## 2013-10-17 NOTE — Telephone Encounter (Signed)
LMTCB for Ashley Norman. 

## 2013-10-17 NOTE — Telephone Encounter (Signed)
Per SN----  levaquin 500 mg  #7  1 daily Align once daily

## 2013-10-17 NOTE — Telephone Encounter (Signed)
Spoke with Butch Penny and notified of recs per SN She verbalized understanding  Rx was sent

## 2013-10-27 ENCOUNTER — Other Ambulatory Visit: Payer: Self-pay | Admitting: Pulmonary Disease

## 2013-11-11 ENCOUNTER — Other Ambulatory Visit: Payer: Self-pay | Admitting: Pulmonary Disease

## 2014-01-12 ENCOUNTER — Other Ambulatory Visit: Payer: Self-pay | Admitting: Pulmonary Disease

## 2014-01-14 ENCOUNTER — Telehealth: Payer: Self-pay | Admitting: Pulmonary Disease

## 2014-01-14 NOTE — Telephone Encounter (Signed)
lmomtcb x1 for Ashley Norman Also need to make aware SN retired from [facility] 12/25/13

## 2014-01-14 NOTE — Telephone Encounter (Signed)
Pt returning call agan.Ashley Norman

## 2014-01-15 NOTE — Telephone Encounter (Signed)
LMTCBx2 for Ashley Norman. [place], CMA

## 2014-01-15 NOTE — Telephone Encounter (Signed)
lmomtcb x1 for donna

## 2014-01-15 NOTE — Telephone Encounter (Signed)
Per SN---  levaquin 500 mg  #7  1 daily.

## 2014-01-16 MED ORDER — LEVOFLOXACIN 500 MG PO TABS: 500.0000 mg | ORAL_TABLET | Freq: Every day | ORAL | Status: DC

## 2014-01-16 NOTE — Telephone Encounter (Signed)
rx sent and donna is aware. [place], CMA

## 2014-01-18 ENCOUNTER — Other Ambulatory Visit: Payer: Self-pay | Admitting: Pulmonary Disease

## 2014-02-06 ENCOUNTER — Other Ambulatory Visit: Payer: Self-pay | Admitting: Pulmonary Disease

## 2014-03-18 ENCOUNTER — Other Ambulatory Visit: Payer: Self-pay | Admitting: Pulmonary Disease

## 2014-03-21 ENCOUNTER — Ambulatory Visit: Payer: Medicare Other | Admitting: Pulmonary Disease

## 2014-03-23 ENCOUNTER — Other Ambulatory Visit: Payer: Self-pay | Admitting: Pulmonary Disease

## 2014-03-31 ENCOUNTER — Ambulatory Visit (INDEPENDENT_AMBULATORY_CARE_PROVIDER_SITE_OTHER): Payer: Medicare Other | Admitting: Pulmonary Disease

## 2014-03-31 ENCOUNTER — Encounter: Payer: Self-pay | Admitting: Pulmonary Disease

## 2014-03-31 VITALS — BP 122/80 | HR 77 | Temp 98.4°F

## 2014-03-31 DIAGNOSIS — R531 Weakness: Secondary | ICD-10-CM

## 2014-03-31 DIAGNOSIS — N259 Disorder resulting from impaired renal tubular function, unspecified: Secondary | ICD-10-CM

## 2014-03-31 DIAGNOSIS — R1310 Dysphagia, unspecified: Secondary | ICD-10-CM

## 2014-03-31 DIAGNOSIS — R7303 Prediabetes: Secondary | ICD-10-CM

## 2014-03-31 DIAGNOSIS — I10 Essential (primary) hypertension: Secondary | ICD-10-CM

## 2014-03-31 DIAGNOSIS — E039 Hypothyroidism, unspecified: Secondary | ICD-10-CM

## 2014-03-31 DIAGNOSIS — R7309 Other abnormal glucose: Secondary | ICD-10-CM

## 2014-03-31 DIAGNOSIS — M1990 Unspecified osteoarthritis, unspecified site: Secondary | ICD-10-CM

## 2014-03-31 DIAGNOSIS — R627 Adult failure to thrive: Secondary | ICD-10-CM

## 2014-03-31 DIAGNOSIS — E213 Hyperparathyroidism, unspecified: Secondary | ICD-10-CM

## 2014-03-31 DIAGNOSIS — J45909 Unspecified asthma, uncomplicated: Secondary | ICD-10-CM

## 2014-03-31 DIAGNOSIS — K219 Gastro-esophageal reflux disease without esophagitis: Secondary | ICD-10-CM

## 2014-03-31 DIAGNOSIS — R5381 Other malaise: Secondary | ICD-10-CM

## 2014-03-31 DIAGNOSIS — R5383 Other fatigue: Secondary | ICD-10-CM

## 2014-03-31 DIAGNOSIS — M810 Age-related osteoporosis without current pathological fracture: Secondary | ICD-10-CM

## 2014-03-31 NOTE — Patient Instructions (Signed)
Today we updated your med list in our EPIC system...    Continue your current medications the same...  Try to get a little exercise every day...  Call for any questions...  Let's plan a follow up visit in 79mo, sooner if needed for problems.Marland KitchenMarland Kitchen

## 2014-03-31 NOTE — Progress Notes (Signed)
Subjective:    Patient ID: Ashley Norman, female    DOB: Sep 10, 1928, 80 y.o.   MRN: [id number]  HPI 80 y/o WF here for a follow up visit... she has mult med problems as listed below...   ~  April 16, 2012:  4-61moROV & family reports that she has refused to get up out of chair or try to stand for ~382moow; weaker, can't get weight, asked to take Megace & Ensure supplements regularly;  They note she has painful right knee & this may be part of the reason she won't get up> on Pred 1010m but only using Tylenol for pain; exam shows knee sl swollen , warm, tender> we will incr Pred to 16m53mfor 10d & refer to [facility] for shot in the knee for relief;  Offered home PT but she declines & children will let me know...    We reviewed prob list, meds, xrays and labs> see below for updates >>  LABS 7/13:  Chems- wnl w/ BS=92 A1c=5.6;  CBC- wnl w/ Hg=12.1;  TSH=3.97  ~  January 04, 2013:  89mo 42mo& MaudeVanettainues to deteriorate according to her family- less cooperation, less mobile, needs help getting up & about/ bathing/ toileting/ etc; In addition she's had incr SOB w/ some cough, wheezing, & "hard breathing"; they are wondering about home Oxygen but her RA resting O2 sat= 92% & as noted she is not active (we will order ONO to check nocturnal sats);  We gave her a NEB treatment in office w/ some benmefit & will arrange for a Home NEB unit + XOPENEX Tid; & incr her Pred to 16mg/68m     Asthma w/ exac> on Pred10, Theo300-1/2Bid, Dulera2Bid, Spiriva, Proair prn; c/o recent exac w/ incr SOB, cough, wheezing; CXR-NAD and we decided to try Depo, incr Pred to 16mg/d27mome NEB w/ XopenexTid...    HBP> on Lisinopril10 + K10; BP=122/80 & she denies CP, palpit, syncope, edema; she is SOB due to the AB exac...    CBruit> on ASA 81; she denies cerebral ischemic symptoms...    DM> off Riomet, on diet alone;  Labs today showed BS= 134; asked to incr oral fluids & attention to nutrition...    Hypothy> ?on  Levothyroid75mcg- 33mor 1 daily?... TSH 7/13 = 3.97 (they did not bring med list or bottles to this OV)...    ?PMarland KitchenMarland Kitchenmary Hyperpara> last several calcium readings have been wnl...    GERD/ Polyps> she has trouble swallowing big pills; eating soft foods ok; they note appetite fair & she can't stand unsupported for a weight; asked to eat 3 meals/d & take Ensure between meals as supplement...    DJD/ Osteoporosis> on Fosamax weekly + MVI/ VitD1000u; ? of compliance but family is supposed to be supervising all meds...    Dementia/ Anxiety> on Zoloft50; she has a senile dementia, less cooperative, family is committed to home care, not yet ready for hospice consideration...    Adult FTT> the sum total of all her problems & her inability to cooperate & assist in her own care/ decisions... We reviewed prob list, meds, xrays and labs> see below for updates >>   CXR 4/14 showed normal heart size, hyperexpanded, clear lungs, NAD...  LABS 4/14:  Chems- ok x BS=134, BUN=40, Cr=1.4;  CBC- ok w/ Hg=15.6, Wbc=11.7...  ~  July 23, 2013:  45mo ROV 545momily states that she has persistent symptoms- incr SOB, ?pressure/ pain in chest present all  the time (NEBS help); using NEBS Tid, AdvairHFA Bid & Spiriva daily; still on Prednisone 5m/d (she was to ret after 190mo never followed up- family kept her on the Pred20) & Theodur30078m; but overall they feel she is stable & they decline further eval w/ Cards etc... Rec to continue same & incr Xopenex NEBS to Qid if needed...     BP is stable on Lisin10 & measures 132/84 today...    She remains on ASA 5m46m& they deny cerebral ischemic symptoms...    She remains on Megace for appetite; could not weigh pt- in wheelchair & unable to stand; they decline home PT etc...    She appears clinically euthyroid on her Synthroid75-1/2 tab daily & TSH= 5.11...    She remains on Ativan & Zoloft for depression & nerves; she remains difficult for family to manage & they prefer the status  quo... We reviewed prob list, meds, xrays and labs> see below for updates >>   LABS 3/15:  Chems- ok w/ BS=124, BUN=30, Creat=1.3;  CBC- wnl;  TSH=5.11;  BNP=37...  ~  March 31, 2014:  39mo 73mo& MaudeLorrynot been out of the house since she was here last; states she is feeling OK but "I'd feel better at home"; she is in a wheelchair & here w/ her son & daughter who care for her; they indicate that it is sometimes difficult to get her to take her pills... We reviewed the following medical problems during today's office visit >>     Asthma w/ exac> on Pred10-2tabsQam, Theo300-1/2Bid, Dulera200-2Bid, Spiriva, Proair vs Xopenex NEBS prn; denies recent exac & Epic review shows Levaquin called in 1/15 & 4/15 w/ good response;  Breathing well now w/o cough, sput, SOB, etc...    HBP> on Lisinopril20-1/2 + K10; BP=122/80 & she denies CP, palpit, syncope;  She has VI & dependent edema which goes down if she keeps her legs elev, reminded to elim salt!    CBruit> on ASA 81; she denies cerebral ischemic symptoms...    DM> on diet alone; eating some better w/ Megace; they are not checking BS often, but usually 120 range, last A1c 2013 was 5.6...   Marland KitchenMarland Kitchenypothy> on Levothyroid75mcg61m2tab/d... TSH 10/14 = 5.11 (they did not bring med list or bottles to this OV)...    Marland KitchenMarland Kitchenrimary Hyperpara> last several calcium readings have been wnl & we continue to follow...    GERD/ Polyps> she has trouble swallowing big pills; eating soft foods ok; they note appetite fair & she can't stand unsupported for a weight; asked to eat 3 meals/d & take Ensure between meals as supplement...    DJD/ Osteoporosis> off Fosamax (they are not giving her this med) + MVI/ VitD1000u; she is unable to stand & has to be lifted onto & off of the commode etc...    Dementia/ Anxiety> she has a senile dementia, less cooperative, family is committed to home care, not yet ready for hospice consideration; she refuses to take the prev Zoloft/ Ativan (rarely takes  this)...    Adult FTT> the sum total of all her problems & her inability to cooperate & assist in her own care/ decisions... We reviewed prob list, meds, xrays and labs> see below for updates >> we did not repeat blood work today...          Problem List:    ADULT FTT >> see above  ASTHMA (ICD-493.90) - she is a non-smoker> Prev on TheoDur 300mg/d35mOAIR  HFA 2spBid &Prn, FLOVENT 220 2spBid;  She stopped all of her meds on her own in June2012 (?why); then we restarted her Rx as below>> ~  baseline CXR w/ hyperinflation, tort Ao, NAD... ~  4/12:  Notes some cough, congestion, intermittent wheezing> on Theo/ Proair/ Flovent;  Rec- Mucinex/ Fluids... ~  CXR 4/12 showed ectatic Ao, hyperinflation c/w emphysema, clear/ NAD.Marland Kitchen. ~  7/12:  We decided to treat w/ PRED 39m Bid (weaned to 162md) + PROAIR prn wheezing... ~  12/12: she presented w/ a mild AB exac; family needs to control all her meds; we added ZPak, Spiriva, Mucinex, Fluids... ~  CXR 12/12 showed hyperinflation but clear, osteopenia, NAD... ~  3/13:  We adjusted meds to DULERA200-2spBid, SPIRIVA-once daily, PRED 1085m, THEODUR 300m69m PROAIR-prn, & MUCINEX-2Bid w/ fluids... ~  4/14: on Pred10, Theo300-1/2Bid, Dulera2Bid, Spiriva, Proair prn; c/o recent exac w/ incr SOB, cough, wheezing; CXR-NAD and we decided to try Depo, incr Pred to 20mg24m home NEB w/ XopenexTid. ~  10/14: she did not ret in 5mo a13moquested; family kept her on Pred20, AdvairHFA115, Spiriva, Theo300, & XopenexNEBS Tid; stable now but has nocturnal exac & ok to incr Xopenex to qid... ~  7/15: on Pred10-2tabsQam, Theo300-1/2Bid, Dulera200-2Bid, Spiriva, Proair vs Xopenex NEBS prn; denies recent exac & Epic review shows Levaquin called in 1/15 & 4/15 w/ good response;  Breathing well now w/o cough, sput, SOB, etc...  HYPERTENSION (ICD-401.9) - back on LISINOPRIL 10mg/d74mmg ta4m/2); she had stopped all of her meds on her own June2012>  ~  cardiac cath 1991 was neg-  norm coronaries (she may have had spasm). ~  labs 3/09 showed K=3.8, BUN=32, Creat=1.8 ~  labs 6/10 showed K=4.4, BUN=46, Creat=2.2... rec> Marland Kitchentop Maxzide & KCl, ROV 5mo. ~  25mo 7/10 showed K=4.6, BUN=58, Creat=1.6... rec> iMarland Kitchencr fluid intake, stay off Maxzide. ~  5/11:  BP=138/72, tol Rx well, no problems; denies HA, visual changes, CP, palipit, syncope, ch in dyspnea, etc; notes tr edema intermittently. ~  4/12:  BP= 138/84 & she remains essentially asymptomatic; K=3.9, BUN=25, Creat=1.4 ~  7/12:  BP= 122/82 off meds but poor intake, weak, wt loss, etc; K=3.3, BUN=23, Creat=1.3> rec K10caps two daily. ~  8/12:  BP= 110/72 back on Lisinopril 10mg/d du24m incr BP at home; K=4.1 on 1tablesp KCL elixir daily. ~  12/12:  BP= 110/68 on Lisinopril monotherapy... Note: BNP= 61. ~  3/13:  BP= 94/60 on Lisin10 & asked to monitor BP at home & hold if BP<110 sys... ~  7/13:  BP= 142/82 on Lisin10 & asked to continue daily med Rx... ~  4/14: on Lisinopril10 + K10; BP=122/80 & she denies CP, palpit, syncope, edema; she is SOB due to the AB exac. ~  10/14: BP is stable on Lisin10 & measures 132/84 today... ~  7/15: on Lisinopril20-1/2 + K10; BP=122/80 & she denies CP, palpit, syncope;  She has VI & dependent edema which goes down if she keeps her legs elev, reminded to elim salt!   ABNORMAL ELECTROCARDIOGRAM (ICD-794.31) - EKG shows NSR, IVCD, LAD, poor R progression... ~  2DEcho 5/11 ==> ordered but pt never returned to have this done...  CAROTID BRUIT (ICD-785.9) - back on ASA 81mg/d> sh43mnies cerebral ischemic symptoms... ~  CDopplers 5/11 ==> ordered but pt never returned to have this done... ~  CDopplers completed 5/12 & showed mild plaque in bulbs, 0-39% bilat ICA stenoses, antegrade vertebral flow.  DIABETES MELLITUS (ICD-250.00) - back on  METFORMIN but taking RIOMET 544m/5ml (1/2 tsp Bid) & appeared to tolerate well... ~  labs 1/08 showed BS=172, HgA1c=6.5 & Metformin 5031md started... ~  labs  3/09 showed BS= 99, A1c= 5.7 ~  labs 6/10 showed BS= 138, A1c= 6.0...Marland Kitchenrec> same med. ~  labs 5/11 (wt=142#) showed BS= 141, A1c= 5.8 ~  Labs 4/12 (wt=129#) showed BS= 100, A1c= 5.9...Marland KitchenMarland Kitchenec to decr Metformin to 1/2 tab each AM. ~  Labs 7/12 (wt=?<120#) & off all meds for 1+mo showed BS=77, A1c=6.2; rec continue MetformER- 1/2Qam (pt on Pred). ~  8/12:  Pt switched to Liq RIOMET 1/2 tsp Bid;  Labs showed BS= 116 ~  Labs 12/12 on Riomet-1/2tspBid showed BS= 134 ~  Labs 7/13 on Riomet showed BS= 92, a1c= 5.6...Marland KitchenMarland Kitchenec to STOP Riomet & Rx w/ diet alone for now... ~  4/14: off Riomet, on diet alone;  Labs today showed BS= 134; asked to incr oral fluids & attention to nutrition... ~  10/14: on Megace for appetite, BS=124, A1c wasn't done...  HYPOTHYROIDISM (ICD-244.9) - prev on LEVOTHYROID 75 micrograms/d (w/ good energy & clinically euthyroid); she stopped all meds on her own 6/12; we restarted LEVOTHYROID 7564m 1/2 tab daily 7/12... ~  labs 1/08 showed TSH=1.84 on 75 micrograms/d... ~  labs 3/09 showed TSH= 5.05 ~  labs 6/10 showed TSH= 1.27 ~  labs 5/11 showed TSH= 0.64 ~  Labs 4/12 on Levo75 showed TSH= 1.61 ~  Labs 7/12 off meds showed TSH= 5.40 & rec to restart 1/2 of the 3m11mab daily. ~  Labs 8/12 on Levo75-1/2 showed TSH= 3.47 & rec to continue same. ~  Labs 12/12 on Levothy75-1/2 showed TSH= 4.87 ~  Labs 7/13 on Levothy75-1/2 showed TSH= 3.97 ~  4/14: Med list indicates Levothyroid 3mc34mtaking 1 tab daily? (they did not bring med bottles or list today)... ~  10/14: on Synthroid75-1/2 tab daily; Labs 0/14 showed TSH=5.11  ? of PRIMARY HYPERPARATHYROIDISM (ICD-252.01) - Ca++ levels in the 10.6-11.0 range... PO4- levels in the 2.1-2.4 range... PTH levels slowly rising 40-65... therefore following clinically w/ serial labs >> ~  labs 1/08 showed:  Ca++ 11.0, PTH= 42.4.... ~Marland KitchenMarland KitchenMarland Kitchenabs 3/09 showed Ca= 10.3, PO4= 3.2, PTH= 50 ~  labs 6/10 showed Ca= 10.9, PO4= 3.7, PTH= 65 ~  labs 5/11 showed  Ca= 10.5-10.9, Phos= 3.0, PTH= 111... rec> continue to follow closely. ~  Labs 4/12 showed Ca= 11.0, PO4= not done, PTH= 80... NOT progressive- rec no calc supplements, incr fluids. ~  Labs 7/12 & 8/12 showed Ca= 9.9 ~  Labs 12/12 showed Ca= 10.2 ~  Labs 7/13 showed Ca= 9.9 ~  Labs 4/14 showed Ca= 10.3 ~  Labs 10/14 showed Ca= 10.3  GERD (ICD-530.81) - remote hx of PUD w/ GIB in the 80's... eval by DrDBrodie in the past... prev on Zantac and Reglan but hasn't filled these since 2009... denies nausea, vomiting, heartburn, diarrhea, constipation, blood in stool, abdominal pain, swelling, gas... she is rec to use OTC Prilosec vs Zantac Prn & avoid Tums etc... ~  7/12:  GI eval by DrDBrodie for adult FTT, wt loss & difficulty swallowing (?dysphagia for solids/pills/ liqs vs really just refusing to swallow)> Given Megace (pt wouldn't take it due to smell & taste), & did EGD> reported neg & dilatation performed...   COLONIC POLYPS (ICD-211.3) - there is a pos family history of colon cancer (in brother)... [facility]tubular [facility])... f/u planned 3-5  yrs, but she never went... she is overdue and needs to sched this important procedure but she has repeatedly refused GI referral...  RENAL INSUFFICIENCY (ICD-588.9)  ~  labs 1/08 showed BUN= 21, Creat= 1.2 ~  labs 3/09 showed BUN= 32, Creat= 1.8 ~  labs 6/10 showed BUN= 46, Creat= 2.2.Marland KitchenMarland Kitchen rec to stop Maxzide (on 1/2 daily) ~  labs 7/10 showed BUN= 58, Creat= 1.6.Marland Kitchen. rec> incr fluid intake. ~  labs 5/11 showed BUN= 28, Creat= 1.5 ~  Labs 4/12 showed BUN=25, Creat=1.4 ~  Labs 7/12 showed BUN= 23, Creat= 1.3 ~  Labs 8/12 showed BUN= 23, Creat= 1.0 ~  Labs 12/12 showed BUN= 19, Creat= 1.1 ~  Labs 7/13 showed BUN= 15, Creat= 1.1 ~  Labs 4/14 showed BUN= 40, Creat= 1.4 & she is not on a diuretic; asked to incr oral fluid intake... ~  Labs 10/14 showed BUN= 30, Creat= 1.3  HEADACHE  (ICD-784.0)  DEGENERATIVE JOINT DISEASE (ICD-715.90) - mod DJD and walks w/ crutches "because of my ankles"... she's seen [facility] in the past... started on low dose Pred 6/10 for this & Asthma> improved & she weaned off the Pred on her own... ~  6/10: CC= "my feet" c/o arthritic pain in feet & ankles... rec> trial Pred 85m/d w/ ROV 120mo~  7/10: pt states improved on the Pred which has been weaned... ~  7/12:  She is in wheelchair> can't vs won't stand ~  7/13:  Family states she's refused to stand, walk, exercise for ~60m52mow; exam shows swollen tender right knee; we will incr Pred20m42m& refer to Ortho. ~  4/14:  Family reports more of the same, and worsening FTT...  OSTEOPOROSIS (ICD-733.00) - currently taking ALENDRONATE 70mg79m no calcium supplement due to borderline hypercalcemia, MVI + Vit D 1000 u OTC supplement. ~  BMD 3/09 here showed TScores -2.9 in Spine, and -2.9 in right FemNeck... started on Alendronate 70mg/560m~  Vit D level 3/09 = 34... rec to start Vit D 1000 u OTC supplement. ~  Vit D level 6/10 = 50 ~  Vit D level 5/11 = 61 ~  5/12:  f/u BMD ==> TScores showed -2.8 in Spine, and -2.7 in bilat FemNecks> Continue Alendronate, MVI, Vit D.  ANXIETY (ICD-300.00) >> she has a mild dementia as well w/ odd affect etc; on ZOLOFT 50mg/d66meems improved per family...  HEALTH MAINTENANCE: ~  GYN: she hasn't seen GYN (DrCollins) in yrs and needs PAP, Mammogram, etc (she has been reminded to set this up w/ each subseq visit here but declines to do so; asked to let us set [place] up for her but she declines our assistance). ~  GI:  [facility]; advised f/u w/ each subseq visit here but she declines GI follow up; referral made to [facility] for this important f/u procedure but she never went. ~  Immunizations:  had Pneumovax 2001... Tetanus (TDAP) given 5/11... she refuses the seasonal Flu vaccines & is reminded of the importance of  these yearly vaccinations.   Past Surgical History  Procedure Laterality Date  . Tonsillectomy and adenoidectomy    . Appendectomy      Outpatient Encounter Prescriptions as of 03/31/2014  Medication Sig  . aspirin 81 MG tablet Take 81 mg by mouth daily.    . Cholecalciferol (VITAMIN D) 1000 UNITS capsule Take 1,000 Units by mouth daily.    . ferrous sulfate 325 (65 FE)  MG tablet Take 325 mg by mouth daily with breakfast.    . fluticasone-salmeterol (ADVAIR HFA) 115-21 MCG/ACT inhaler Inhale 2 puffs into the lungs 2 (two) times daily. Rinse after each use.  . levalbuterol (XOPENEX) 0.63 MG/3ML nebulizer solution inhale contents of 1 vial in nebulizer four times a day  . levothyroxine (SYNTHROID, LEVOTHROID) 75 MCG tablet take 1/2  tablet by mouth once daily  . lisinopril (PRINIVIL,ZESTRIL) 20 MG tablet Take 10 mg by mouth daily.    Marland Kitchen LORazepam (ATIVAN) 1 MG tablet Take 0.5-1 tablets (0.5-1 mg total) by mouth 3 (three) times daily as needed for anxiety.  . megestrol (MEGACE ORAL) 40 MG/ML suspension Take 1 teaspoon by mouth three times daily with meals  . Multiple Vitamin (MULTIVITAMIN PO) Take 1 tablet by mouth daily.    . potassium chloride (K-DUR) 10 MEQ tablet Take 1 tablet (10 mEq total) by mouth daily.  . predniSONE (DELTASONE) 10 MG tablet take 2 tablets by mouth once daily  . PROAIR HFA 108 (90 BASE) MCG/ACT inhaler inhale 2 puffs by mouth every 6 hours if needed for wheezing  . sertraline (ZOLOFT) 50 MG tablet take 1 tablet by mouth once daily  . SPIRIVA HANDIHALER 18 MCG inhalation capsule inhale contents of 1 capsule by mouth once daily  . theophylline (THEODUR) 300 MG 12 hr tablet take 1 tablet by mouth once daily  . alendronate (FOSAMAX) 70 MG tablet TAKE 1 TABLET BY MOUTH EVERY WEEK  . [DISCONTINUED] levofloxacin (LEVAQUIN) 500 MG tablet Take 1 tablet (500 mg total) by mouth daily.  . [DISCONTINUED] mometasone-formoterol (DULERA) 200-5 MCG/ACT AERO Inhale 2 puffs into the  lungs 2 (two) times daily.    Allergies  Allergen Reactions  . Amoxicillin-Pot Clavulanate     REACTION: rash, itching  . Cephalexin     REACTION: rash, itching  . Codeine     REACTION: rash, itching    Current Medications, Allergies, Past Medical History, Past Surgical History, Family History, and Social History were reviewed in [employer] record.    Review of Systems         See HPI - all other systems neg except as noted... The patient complains of difficulty walking, otherw she is very stoic & denies all symptoms.  The patient denies anorexia, fever, weight loss, weight gain, vision loss, hoarseness, chest pain, syncope, peripheral edema, prolonged cough, headaches, hemoptysis, abdominal pain, melena, hematochezia, severe indigestion/heartburn, hematuria, incontinence, muscle weakness, suspicious skin lesions, transient blindness, depression, unusual weight change, abnormal bleeding, enlarged lymph nodes, and angioedema.     Objective:   Physical Exam     WD, Thin, 80 y/o WF, chronically ill appearing, now weaker, in wheelchair, difficulty standing unaided, & cachectic w/o focal localizing symptoms... GENERAL:  Alert & sl confused, less agitated & more alert... HEENT:  /AT, EOM-wnl, PERRLA, EACs-clear, TMs-wnl, NOSE-clear, THROAT-clear & wnl. NECK:  Supple w/ fairROM; no JVD; normal carotid impulses w/ 1+ bilat bruits; no thyromegaly or nodules palpated; no lymphadenopathy. CHEST:  decr BS bilat, clear to P & A; without wheezes or rales; + scat rhonchi heard...  HEART:  Regular Rhythm; +gr1-2 sys murmur, no rubs or gallops detected... ABDOMEN:  Soft & nontender; normal bowel sounds; no organomegaly or masses palpated... EXT: without deformities, mild arthritic changes; no varicose veins/ venous insuffic/ or edema. She has bilat canes but unable to stand unaided... NEURO:  CN's intact;  no focal neuro deficits, diffusely weak... DERM:  No lesions noted;  no rash  etc..Marland Kitchen  RADIOLOGY DATA:  Reviewed in the EPIC EMR & discussed w/ the patient...  LABORATORY DATA:  Reviewed in the EPIC EMR & discussed w/ the patient...   Assessment & Plan:    ADULT FTT>  FTT persists despite all efforts by [place] & family;  Continue Megace, ensure, encouragement;  They are not optimistic that she will walk again...   ASTHMA>  on Pred20, Theo300-1/2Bid, AdvairHFA115-2Bid, Spiriva, Xopenex NEBS Tid, Proair prn; she has persist symptoms & they are encouraged to use Rx regularly & ok to incr Xopenex to Qid...  HBP>  Controlled back on her low dose ACE & we will continue to monitor her BP as her intake (hopefully) improves; K= 4.1 on the elixir 1 tablesp daily==> they say she won't drink it anymore, therefore change to K10caps-2Qd...  Abn EKG/ CBruit>  She never went for prev 2DEcho/ CDopplers (ordered 5/11) & seemed oblivious to recommendations; discussed w/ pt> stable on ASA 58m/d & not interested in aggressive eval or treatments; She finally had CDopplers done 5/12 & they were OK= mild plaque & 0-39% ICA stenoses> rec continue ASA.  DM>  Off meds since 7/13 & BS= 124 on diet alone; eating fair & rec to avoid sugar & sweets...   Hypothy>  We had prev decr the Synthroid75 to 1/2 tab daily but she's back on 1 tab now, clinically euthyroid, asked to continue same &   ?Hyperpara>  Prev Ca up to 11 and PTH= 80 & not progressive, therefore continue to watch carefully & REC no calc supplements etc; Ca= 10.3 currently & stable...  Hx Renal insuffic>  Creat is stable at 1.3 & I suspect due to poor oral intake- asked to incr oral fluids...  DJD/ Osteoporosis>  Mod severe DJD & now stable on Pred; f/u BMD on the Fosamax ==> sl improved, continue same.  DEPRESSION/ ?Psychiatric illness/ Senile Dementia/ Adult FTT/ Weakness> Change in personality, mental status, mood, family relations, etc over the past several yrs; we discussed plan that REQUIRES her to take meds as outlined,  take in adeq nutrition & Glucerna supplements, & drink fluids... On Zoloft 524md & slowly improved...  Other medical issues as noted >> SED rate was elev at 67 ==> 22 on the Pred etc...   Patient's Medications  New Prescriptions   No medications on file  Previous Medications   ALENDRONATE (FOSAMAX) 70 MG TABLET    TAKE 1 TABLET BY MOUTH EVERY WEEK   ASPIRIN 81 MG TABLET    Take 81 mg by mouth daily.     CHOLECALCIFEROL (VITAMIN D) 1000 UNITS CAPSULE    Take 1,000 Units by mouth daily.     FERROUS SULFATE 325 (65 FE) MG TABLET    Take 325 mg by mouth daily with breakfast.     FLUTICASONE-SALMETEROL (ADVAIR HFA) 115-21 MCG/ACT INHALER    Inhale 2 puffs into the lungs 2 (two) times daily. Rinse after each use.   LEVALBUTEROL (XOPENEX) 0.63 MG/3ML NEBULIZER SOLUTION    inhale contents of 1 vial in nebulizer four times a day   LEVOTHYROXINE (SYNTHROID, LEVOTHROID) 75 MCG TABLET    take 1/2  tablet by mouth once daily   LISINOPRIL (PRINIVIL,ZESTRIL) 20 MG TABLET    Take 10 mg by mouth daily.     LORAZEPAM (ATIVAN) 1 MG TABLET    Take 0.5-1 tablets (0.5-1 mg total) by mouth 3 (three) times daily as needed for anxiety.   MEGESTROL (MEGACE ORAL) 40 MG/ML SUSPENSION  Take 1 teaspoon by mouth three times daily with meals   MULTIPLE VITAMIN (MULTIVITAMIN PO)    Take 1 tablet by mouth daily.     POTASSIUM CHLORIDE (K-DUR) 10 MEQ TABLET    Take 1 tablet (10 mEq total) by mouth daily.   PREDNISONE (DELTASONE) 10 MG TABLET    take 2 tablets by mouth once daily   PROAIR HFA 108 (90 BASE) MCG/ACT INHALER    inhale 2 puffs by mouth every 6 hours if needed for wheezing   SERTRALINE (ZOLOFT) 50 MG TABLET    take 1 tablet by mouth once daily   SPIRIVA HANDIHALER 18 MCG INHALATION CAPSULE    inhale contents of 1 capsule by mouth once daily   THEOPHYLLINE (THEODUR) 300 MG 12 HR TABLET    take 1 tablet by mouth once daily  Modified Medications   No medications on file  Discontinued Medications   LEVOFLOXACIN  (LEVAQUIN) 500 MG TABLET    Take 1 tablet (500 mg total) by mouth daily.   MOMETASONE-FORMOTEROL (DULERA) 200-5 MCG/ACT AERO    Inhale 2 puffs into the lungs 2 (two) times daily.

## 2014-04-11 ENCOUNTER — Other Ambulatory Visit: Payer: Self-pay | Admitting: Pulmonary Disease

## 2014-04-20 ENCOUNTER — Other Ambulatory Visit: Payer: Self-pay | Admitting: Pulmonary Disease

## 2014-05-03 ENCOUNTER — Other Ambulatory Visit: Payer: Self-pay | Admitting: Pulmonary Disease

## 2014-05-26 ENCOUNTER — Telehealth: Payer: Self-pay | Admitting: Pulmonary Disease

## 2014-05-26 ENCOUNTER — Other Ambulatory Visit: Payer: Self-pay | Admitting: Pulmonary Disease

## 2014-05-26 MED ORDER — MEGESTROL ACETATE 40 MG/ML PO SUSP: ORAL | Status: DC

## 2014-05-26 MED ORDER — LEVOFLOXACIN 500 MG PO TABS: 500.0000 mg | ORAL_TABLET | Freq: Every day | ORAL | Status: DC

## 2014-05-26 NOTE — Telephone Encounter (Signed)
Per SN--  Ok to send in refills of the megace and call in levaquin 500 mg  #7  1 daily.  This has been sent to the pts pharmacy and the pts daughter is aware.  Nothing further is needed.

## 2014-05-26 NOTE — Telephone Encounter (Signed)
Spoke with pt's daughter She states that the pt needs refill on Megace- has not been taking this much but wants to start back   She is also c/o "sinus infection"- sore throat, congestion and prod cough with minimal clear to yellow sputum  Daughter asking for abx to be called in  Please advise, thanks! Allergies  Allergen Reactions  . Amoxicillin-Pot Clavulanate     REACTION: rash, itching  . Cephalexin     REACTION: rash, itching  . Codeine     REACTION: rash, itching

## 2014-07-16 ENCOUNTER — Other Ambulatory Visit: Payer: Self-pay | Admitting: Pulmonary Disease

## 2014-08-11 ENCOUNTER — Telehealth: Payer: Self-pay | Admitting: Pulmonary Disease

## 2014-08-11 MED ORDER — LEVOFLOXACIN 500 MG PO TABS: 500.0000 mg | ORAL_TABLET | Freq: Every day | ORAL | Status: DC

## 2014-08-11 NOTE — Telephone Encounter (Signed)
lmtcb x1 

## 2014-08-11 NOTE — Telephone Encounter (Signed)
Pt's son returned call. Pt' son stated pt is having increase in SOB, prod cough with green mucus, chest congestion, sore throat. Pt denies f/c/s, swelling, CP/tightness and pt is not taking anything OTC. Pt last seen 03/31/2014 by SN. Pt requesting abx.   SN please advise.   Allergies  Allergen Reactions  . Amoxicillin-Pot Clavulanate     REACTION: rash, itching  . Cephalexin     REACTION: rash, itching  . Codeine     REACTION: rash, itching    Current Outpatient Prescriptions on File Prior to Visit  Medication Sig Dispense Refill  . alendronate (FOSAMAX) 70 MG tablet TAKE 1 TABLET BY MOUTH EVERY WEEK 4 tablet 5  . aspirin 81 MG tablet Take 81 mg by mouth daily.      . Cholecalciferol (VITAMIN D) 1000 UNITS capsule Take 1,000 Units by mouth daily.      . ferrous sulfate 325 (65 FE) MG tablet Take 325 mg by mouth daily with breakfast.      . fluticasone-salmeterol (ADVAIR HFA) 115-21 MCG/ACT inhaler Inhale 2 puffs into the lungs 2 (two) times daily. Rinse after each use. 1 Inhaler 6  . levalbuterol (XOPENEX) 0.63 MG/3ML nebulizer solution inhale orally contents of 1 vial in nebulizer four times a day 360 mL 2  . levofloxacin (LEVAQUIN) 500 MG tablet Take 1 tablet (500 mg total) by mouth daily. 7 tablet 0  . levothyroxine (SYNTHROID, LEVOTHROID) 75 MCG tablet take 1/2  tablet by mouth once daily    . lisinopril (PRINIVIL,ZESTRIL) 20 MG tablet Take 10 mg by mouth daily.      Marland Kitchen lisinopril (PRINIVIL,ZESTRIL) 20 MG tablet take 1 tablet by mouth once daily 30 tablet 6  . LORazepam (ATIVAN) 1 MG tablet Take 0.5-1 tablets (0.5-1 mg total) by mouth 3 (three) times daily as needed for anxiety. 90 tablet 0  . megestrol (MEGACE ORAL) 40 MG/ML suspension Take 1 teaspoon by mouth three times daily with meals 240 mL prn  . Multiple Vitamin (MULTIVITAMIN PO) Take 1 tablet by mouth daily.      . potassium chloride (K-DUR) 10 MEQ tablet Take 1 tablet (10 mEq total) by mouth daily. 30 tablet 6  . predniSONE  (DELTASONE) 10 MG tablet take 2 tablets by mouth once daily 60 tablet 2  . PROAIR HFA 108 (90 BASE) MCG/ACT inhaler inhale 2 puffs by mouth every 6 hours if needed for wheezing 8.5 g 1  . sertraline (ZOLOFT) 50 MG tablet take 1 tablet by mouth once daily 30 tablet 5  . SPIRIVA HANDIHALER 18 MCG inhalation capsule inhale contents of 1 capsule by mouth once daily 30 capsule 11  . theophylline (THEODUR) 300 MG 12 hr tablet take 1 tablet by mouth once daily 30 tablet 3   No current facility-administered medications on file prior to visit.

## 2014-08-11 NOTE — Telephone Encounter (Signed)
Called and spoke with pts son and he is aware of SN recs.  He is aware that this medication has been sent to the pharmacy and nothing further is needed.

## 2014-08-11 NOTE — Telephone Encounter (Signed)
Per SN--  levaquin 500 mg  1 daily  #7  With 1 refill.  Take align once daily while on the abx

## 2014-08-15 ENCOUNTER — Other Ambulatory Visit: Payer: Self-pay | Admitting: Pulmonary Disease

## 2014-08-22 ENCOUNTER — Other Ambulatory Visit: Payer: Self-pay | Admitting: Pulmonary Disease

## 2014-09-14 ENCOUNTER — Other Ambulatory Visit: Payer: Self-pay | Admitting: Pulmonary Disease

## 2014-10-03 ENCOUNTER — Other Ambulatory Visit: Payer: Self-pay | Admitting: Pulmonary Disease

## 2014-10-06 ENCOUNTER — Other Ambulatory Visit: Payer: Self-pay | Admitting: Pulmonary Disease

## 2014-10-07 ENCOUNTER — Other Ambulatory Visit: Payer: Self-pay | Admitting: Pulmonary Disease

## 2014-10-09 ENCOUNTER — Telehealth: Payer: Self-pay | Admitting: Pulmonary Disease

## 2014-10-09 MED ORDER — LEVOFLOXACIN 25 MG/ML PO SOLN: 250.0000 mg | Freq: Two times a day (BID) | ORAL | Status: DC

## 2014-10-09 NOTE — Telephone Encounter (Signed)
Ashley Norman is aware of SN's rec. Rx has been sent in.

## 2014-10-09 NOTE — Telephone Encounter (Signed)
Spoke with pt's daughter, states that pt c/o sore throat, prod cough with yellow/green mucus X3 days, sob with exertion.  Pt's daughter is requesting a liquid abx be sent in to [facility] in [place].  Last ov: 03/31/14 Next ov: no follow up appt.   Dr. Lenna Gilford please advise.  Thanks.   Allergies  Allergen Reactions  . Amoxicillin-Pot Clavulanate     REACTION: rash, itching  . Cephalexin     REACTION: rash, itching  . Codeine     REACTION: rash, itching   Current Outpatient Prescriptions on File Prior to Visit  Medication Sig Dispense Refill  . alendronate (FOSAMAX) 70 MG tablet TAKE 1 TABLET BY MOUTH EVERY WEEK 4 tablet 5  . aspirin 81 MG tablet Take 81 mg by mouth daily.      . Cholecalciferol (VITAMIN D) 1000 UNITS capsule Take 1,000 Units by mouth daily.      . ferrous sulfate 325 (65 FE) MG tablet Take 325 mg by mouth daily with breakfast.      . fluticasone-salmeterol (ADVAIR HFA) 115-21 MCG/ACT inhaler Inhale 2 puffs into the lungs 2 (two) times daily. Rinse after each use. 1 Inhaler 6  . levalbuterol (XOPENEX) 0.63 MG/3ML nebulizer solution USE 1 VIAL 4 TIMES A DAY 300 mL 2  . levofloxacin (LEVAQUIN) 500 MG tablet Take 1 tablet (500 mg total) by mouth daily. 7 tablet 0  . levofloxacin (LEVAQUIN) 500 MG tablet Take 1 tablet (500 mg total) by mouth daily. 7 tablet 1  . levothyroxine (SYNTHROID, LEVOTHROID) 75 MCG tablet take 1/2  tablet by mouth once daily    . levothyroxine (SYNTHROID, LEVOTHROID) 75 MCG tablet take 1 tablet by mouth once daily 30 tablet 3  . lisinopril (PRINIVIL,ZESTRIL) 20 MG tablet Take 10 mg by mouth daily.      Marland Kitchen lisinopril (PRINIVIL,ZESTRIL) 20 MG tablet take 1 tablet by mouth once daily 30 tablet 6  . LORazepam (ATIVAN) 1 MG tablet Take 0.5-1 tablets (0.5-1 mg total) by mouth 3 (three) times daily as needed for anxiety. 90 tablet 0  . megestrol (MEGACE ORAL) 40 MG/ML suspension Take 1 teaspoon by mouth three times daily with meals 240 mL prn  . Multiple  Vitamin (MULTIVITAMIN PO) Take 1 tablet by mouth daily.      . potassium chloride (K-DUR) 10 MEQ tablet Take 1 tablet (10 mEq total) by mouth daily. 30 tablet 6  . predniSONE (DELTASONE) 10 MG tablet take 2 tablets by mouth once daily 60 tablet 2  . PROAIR HFA 108 (90 BASE) MCG/ACT inhaler INHALE 2 PUFFS EVERY 6 HOURS IF NEEDED FOR WHEEZING 8.5 g 1  . PROAIR HFA 108 (90 BASE) MCG/ACT inhaler inhale 2 puffs by mouth every 6 hours if needed for wheezing 8.5 g 1  . sertraline (ZOLOFT) 50 MG tablet take 1 tablet by mouth once daily 30 tablet 5  . SPIRIVA HANDIHALER 18 MCG inhalation capsule inhale contents of 1 capsule by mouth once daily 30 capsule 11  . theophylline (THEODUR) 300 MG 12 hr tablet take 1 tablet by mouth once daily 30 tablet 3   No current facility-administered medications on file prior to visit.

## 2014-10-09 NOTE — Telephone Encounter (Signed)
Per SN---  Ok to call in levaquin oral solution 25mg /ml 2 tsp po bid x 5 days.

## 2014-11-08 ENCOUNTER — Other Ambulatory Visit: Payer: Self-pay | Admitting: Pulmonary Disease

## 2014-11-10 ENCOUNTER — Ambulatory Visit: Payer: Medicare Other | Admitting: Pulmonary Disease

## 2014-11-18 ENCOUNTER — Ambulatory Visit: Payer: Medicare Other | Admitting: Pulmonary Disease

## 2014-11-28 ENCOUNTER — Telehealth: Payer: Self-pay | Admitting: Pulmonary Disease

## 2014-11-28 NOTE — Telephone Encounter (Signed)
Called and spoke with pts daughter and she is aware of appt with SN on 3-18 at 103.  She stated that the pts breathing is getting worse and she is using her inhaler more often.

## 2014-12-01 ENCOUNTER — Other Ambulatory Visit: Payer: Self-pay | Admitting: Pulmonary Disease

## 2014-12-05 ENCOUNTER — Telehealth: Payer: Self-pay | Admitting: Pulmonary Disease

## 2014-12-05 NOTE — Telephone Encounter (Signed)
Pt son called back. He reports he picked up paperwork last week for FMLA. He reports on the paperwork it stated she would need care 1 time every 2 months and if she has episode will last about 2 days.  Pt reports SN has been given him more time than this. He is going to have paperwork re sent per his request.  Also reports he missed work 3/1-3/3 to take care of her. He is wanting letter from [facility] excusing him from work. Aware SN out until next week. Please advise thanks

## 2014-12-05 NOTE — Telephone Encounter (Signed)
LMTCB x 1 

## 2014-12-08 NOTE — Telephone Encounter (Signed)
Per SN---  Ok to do the letter for the pt to be excused from work from 3/1 through 3/3 to take care of his mother.  SN has not received the FMLA forms back to fix per pts son request.    Letter has been completed and placed up front for pt to come by and pick up.  i have called and lmomtcb x 1 to make the pt aware.

## 2014-12-08 NOTE — Telephone Encounter (Signed)
Pt called back and checking on this. Please advise thanks

## 2014-12-09 NOTE — Telephone Encounter (Signed)
Called made pt son aware of below. Nothing further needed

## 2014-12-12 ENCOUNTER — Telehealth: Payer: Self-pay | Admitting: Pulmonary Disease

## 2014-12-12 ENCOUNTER — Ambulatory Visit (INDEPENDENT_AMBULATORY_CARE_PROVIDER_SITE_OTHER): Payer: Medicare Other | Admitting: Pulmonary Disease

## 2014-12-12 ENCOUNTER — Encounter: Payer: Self-pay | Admitting: Pulmonary Disease

## 2014-12-12 VITALS — BP 120/84 | HR 89 | Temp 97.6°F

## 2014-12-12 DIAGNOSIS — I10 Essential (primary) hypertension: Secondary | ICD-10-CM | POA: Diagnosis not present

## 2014-12-12 DIAGNOSIS — J4540 Moderate persistent asthma, uncomplicated: Secondary | ICD-10-CM | POA: Diagnosis not present

## 2014-12-12 DIAGNOSIS — R627 Adult failure to thrive: Secondary | ICD-10-CM

## 2014-12-12 DIAGNOSIS — E039 Hypothyroidism, unspecified: Secondary | ICD-10-CM

## 2014-12-12 DIAGNOSIS — R7309 Other abnormal glucose: Secondary | ICD-10-CM | POA: Diagnosis not present

## 2014-12-12 DIAGNOSIS — M159 Polyosteoarthritis, unspecified: Secondary | ICD-10-CM

## 2014-12-12 DIAGNOSIS — M150 Primary generalized (osteo)arthritis: Secondary | ICD-10-CM

## 2014-12-12 DIAGNOSIS — M810 Age-related osteoporosis without current pathological fracture: Secondary | ICD-10-CM

## 2014-12-12 DIAGNOSIS — K219 Gastro-esophageal reflux disease without esophagitis: Secondary | ICD-10-CM

## 2014-12-12 DIAGNOSIS — R531 Weakness: Secondary | ICD-10-CM

## 2014-12-12 DIAGNOSIS — S91302A Unspecified open wound, left foot, initial encounter: Secondary | ICD-10-CM

## 2014-12-12 DIAGNOSIS — R1310 Dysphagia, unspecified: Secondary | ICD-10-CM

## 2014-12-12 DIAGNOSIS — L299 Pruritus, unspecified: Secondary | ICD-10-CM

## 2014-12-12 DIAGNOSIS — E213 Hyperparathyroidism, unspecified: Secondary | ICD-10-CM

## 2014-12-12 DIAGNOSIS — R7303 Prediabetes: Secondary | ICD-10-CM

## 2014-12-12 DIAGNOSIS — N259 Disorder resulting from impaired renal tubular function, unspecified: Secondary | ICD-10-CM

## 2014-12-12 MED ORDER — HYDROXYZINE HCL 10 MG PO TABS: 10.0000 mg | ORAL_TABLET | Freq: Four times a day (QID) | ORAL | Status: AC | PRN

## 2014-12-12 MED ORDER — BUDESONIDE 0.5 MG/2ML IN SUSP: 0.5000 mg | Freq: Two times a day (BID) | RESPIRATORY_TRACT | Status: AC

## 2014-12-12 MED ORDER — FLUOCINONIDE-E 0.05 % EX CREA: TOPICAL_CREAM | CUTANEOUS | Status: DC

## 2014-12-12 NOTE — Telephone Encounter (Signed)
Message not needed/spm °

## 2014-12-12 NOTE — Progress Notes (Signed)
Subjective:    Patient ID: Ashley Norman, female    DOB: Jan 27, 1928, 81 y.o.   MRN: [id number]  HPI 81 y/o WF here for a follow up visit... she has mult med problems as listed below...  ~  SEE PREV EPIC NOTES FOR OLDER DATA >>   ~  January 04, 2013:  955moROV & MCarmiecontinues to deteriorate according to Ashley family- less cooperation, less mobile, needs help getting up & about/ bathing/ toileting/ etc; In addition she's had incr SOB w/ some cough, wheezing, & "hard breathing"; they are wondering about home Oxygen but Ashley RA resting O2 sat= 92% & as noted she is not active (we will order ONO to check nocturnal sats);  We gave Ashley a NEB treatment in office w/ some benmefit & will arrange for a Home NEB unit + XOPENEX Tid; & incr Ashley Pred to 257md...     Asthma w/ exac> on Pred10, Theo300-1/2Bid, Dulera2Bid, Spiriva, Proair prn; c/o recent exac w/ incr SOB, cough, wheezing; CXR-NAD and we decided to try Depo, incr Pred to 206m & home NEB w/ XopenexTid...    HBP> on Lisinopril10 + K10; BP=122/80 & she denies CP, palpit, syncope, edema; she is SOB due to the AB exac...    CBruit> on ASA 81; she denies cerebral ischemic symptoms...    DM> off Riomet, on diet alone;  Labs Norman showed BS= 134; asked to incr oral fluids & attention to nutrition...    Hypothy> ?on Levothyroid75m65m1/2 or 1 daily?... TSH 7/13 = 3.97 (they did not bring med list or bottles to this OV)...  Marland KitchenMarland Kitchen?Primary Hyperpara> last several calcium readings have been wnl...    GERD/ Polyps> she has trouble swallowing big pills; eating soft foods ok; they note appetite fair & she can't stand unsupported for a weight; asked to eat 3 meals/d & take Ensure between meals as supplement...    DJD/ Osteoporosis> on Fosamax weekly + MVI/ VitD1000u; ? of compliance but family is supposed to be supervising all meds...    Dementia/ Anxiety> on Zoloft50; she has a senile dementia, less cooperative, family is committed to home care, not yet ready for  hospice consideration...    Adult FTT> the sum total of all Ashley problems & Ashley inability to cooperate & assist in Ashley own care/ decisions... We reviewed prob list, meds, xrays and labs> see below for updates >>   CXR 4/14 showed normal heart size, hyperexpanded, clear lungs, NAD...  LABS 4/14:  Chems- ok x BS=134, BUN=40, Cr=1.4;  CBC- ok w/ Hg=15.6, Wbc=11.7...  ~  July 23, 2013:  955mo 38mo& family states that she has persistent symptoms- incr SOB, ?pressure/ pain in chest present all the time (NEBS help); using NEBS Tid, AdvairHFA Bid & Spiriva daily; still on Prednisone 20mg/32mhe was to ret after 55mo & 4955mor followed up- family kept Ashley on the Pred20) & Theodur300mg/d;62m overall they feel she is stable & they decline further eval w/ Cards etc... Rec to continue same & incr Xopenex NEBS to Qid if needed...     BP is stable on Lisin10 & measures 132/84 Norman...    She remains on ASA 81mg/d &62my deny cerebral ischemic symptoms...    She remains on Megace for appetite; could not weigh pt- in wheelchair & unable to stand; they decline home PT etc...    She appears clinically euthyroid on Ashley Synthroid75-1/2 tab daily & TSH= 5.11...    She remains  on Ativan & Zoloft for depression & nerves; she remains difficult for family to manage & they prefer the status quo... We reviewed prob list, meds, xrays and labs> see below for updates >>   LABS 3/15:  Chems- ok w/ BS=124, BUN=30, Creat=1.3;  CBC- wnl;  TSH=5.11;  BNP=37...  ~  March 31, 2014:  57moROV & MCherilynhas not been out of the house since she was here last; states she is feeling OK but "I'd feel better at home"; she is in a wheelchair & here w/ Ashley son & daughter who care for Ashley; they indicate that it is sometimes difficult to get Ashley to take Ashley pills... We reviewed the following medical problems during Norman's office visit >>     Asthma w/ exac> on Pred10-2tabsQam, Theo300-1/2Bid, Dulera200-2Bid, Spiriva, Proair vs Xopenex NEBS prn; denies  recent exac & Epic review shows Levaquin called in 1/15 & 4/15 w/ good response;  Breathing well now w/o cough, sput, SOB, etc...    HBP> on Lisinopril20-1/2 + K10; BP=122/80 & she denies CP, palpit, syncope;  She has VI & dependent edema which goes down if she keeps Ashley legs elev, reminded to elim salt!    CBruit> on ASA 81; she denies cerebral ischemic symptoms...    DM> on diet alone; eating some better w/ Megace; they are not checking BS often, but usually 120 range, last A1c 2013 was 5.6..Marland KitchenMarland Kitchen   Hypothy> on Levothyroid734m- 1/2tab/d... TSH 10/14 = 5.11 (they did not bring med list or bottles to this OV)...Marland KitchenMarland Kitchen  ?Primary Hyperpara> last several calcium readings have been wnl & we continue to follow...    GERD/ Polyps> she has trouble swallowing big pills; eating soft foods ok; they note appetite fair & she can't stand unsupported for a weight; asked to eat 3 meals/d & take Ensure between meals as supplement...    DJD/ Osteoporosis> off Fosamax (they are not giving Ashley this med) + MVI/ VitD1000u; she is unable to stand & has to be lifted onto & off of the commode etc...    Dementia/ Anxiety> she has a senile dementia, less cooperative, family is committed to home care, not yet ready for hospice consideration; she refuses to take the prev Zoloft/ Ativan (rarely takes this)...    Adult FTT> the sum total of all Ashley problems & Ashley inability to cooperate & assist in Ashley own care/ decisions... We reviewed prob list, meds, xrays and labs> see below for updates >> we did not repeat blood work Norman...  ~  December 12, 2014:  32m10moV & pt's son&daughter bring Ashley in Norman w/ mult issues> uncooperative 7 won't take meds regularly, incr SOB/ wheezing, diffuse excoriated rash w/ itching, 2 fluid filled blisters in Ashley left heel area... We reviewed the following medical problems during Norman's office visit >>     Asthma w/ exac> supposed to be on Pred10-2tabsQam, Theo300-1/2Bid, Dulera200-2Bid, Spiriva, Xopenex NEBS  prn; GarDominica Norman that she is using the NEBS w/ Xopenex Qid but not using Dulera/ Spiriva/ Theodur/ and won't take the Pred except maybe 2-3d per wk; I encouraged Ashley to take meds as prescribed & the Pred20m66mfor now; we decided to continue the NEBS w/ Xopenex Qid & add BUDESONIDE 0.5mg 34m regularly...    HBP> on Lisinopril20-1/2 + K10; BP=120/84 & she denies CP, palpit, syncope;  She has VI & dependent edema which goes down if she keeps Ashley legs elev, reminded to elim salt!  CBruit> on ASA 81; she denies cerebral ischemic symptoms...    DM> on diet alone; eating some better w/ Megace; using Ensure occas, they are not checking BS often, but usually 120 range, last A1c 2013 was 5.6.Marland KitchenMarland Kitchen    Hypothy> on Levothyroid45mcg- 1/2tab/d... TSH 10/14 = 5.11... Again she is encouraged to take meds every day for max benefit...    ?Primary Hyperpara> last several calcium readings have been wnl & we continue to follow...    GERD/ Polyps> she has trouble swallowing big pills; refuses GI eval for EGD/ dilatation; eating soft foods, appetite fair & she can't stand unsupported for a weight; asked to eat 3 meals/d & take Ensure between meals as supplement...    DJD/ Osteoporosis> off Fosamax (they are not giving Ashley this med) + MVI/ VitD1000u; she is unable to stand & has to be lifted onto & off of the commode etc...    Dementia/ Anxiety> she has a senile dementia, less cooperative, family is committed to home care, not yet ready for hospice consideration; she refuses to take the Zoloft/ Ativan (rarely takes this)...    DERM> 11/2014 she has excoriated rash on arms, chest, back- rec refer to Derm for eval (they will call their Derm to see Ashley), try Atarax25Qid for itching & Lidex-E cream for now... She also has new draining water blisters on heel/ medial area left foot (no signs of cellulitis); advised wound care clinic & treat w/ mild soapy water, pat dry, cover w/ gauze & gauze kling wrap...    Adult FTT> the sum total of  all Ashley problems & Ashley inability to cooperate & assist in Ashley own care/ decisions... PLAN>> we spent 70min face to face time reviewing history, old records, examining pt, discussing options and plans w/ pt & Ashley adult children; use Xopenex NEBS Qid & add Budesonide 0.$RemoveBeforeDE'5mg'EhtUBAuLdbCIZDa$ Bid; needs to take prescribed meds compliantly including the Pred;  For rash> refer to Derm for eval (they will call their Derm to see Ashley), try Atarax25Qid for itching & Lidex-E cream for now;  For lesions left heel> advised wound care clinic & treat w/ mild soapy water, pat dry, cover w/ gauze & gauze kling wrap...           Problem List:    ADULT FTT >> see above  ASTHMA (ICD-493.90) - she is a non-smoker> Prev on TheoDur $RemoveBe'300mg'nqRnumIhP$ /d, PROAIR HFA 2spBid &Prn, FLOVENT 220 2spBid;  She stopped all of Ashley meds on Ashley own in June2012 (?why); then we restarted Ashley Rx as below>> ~  baseline CXR w/ hyperinflation, tort Ao, NAD... ~  4/12:  Notes some cough, congestion, intermittent wheezing> on Theo/ Proair/ Flovent;  Rec- Mucinex/ Fluids... ~  CXR 4/12 showed ectatic Ao, hyperinflation c/w emphysema, clear/ NAD.Marland Kitchen. ~  7/12:  We decided to treat w/ PRED $Remov'10mg'UtRdej$  Bid (weaned to $RemoveBe'10mg'XHOkDUadj$ /d) + PROAIR prn wheezing... ~  12/12: she presented w/ a mild AB exac; family needs to control all Ashley meds; we added ZPak, Spiriva, Mucinex, Fluids... ~  CXR 12/12 showed hyperinflation but clear, osteopenia, NAD... ~  3/13:  We adjusted meds to DULERA200-2spBid, SPIRIVA-once daily, PRED $RemoveBef'10mg'wqhYVZbSXU$ /d, THEODUR $RemoveBef'300mg'ZwTiOdjIad$ /d, PROAIR-prn, & MUCINEX-2Bid w/ fluids... ~  4/14: on Pred10, Theo300-1/2Bid, Dulera2Bid, Spiriva, Proair prn; c/o recent exac w/ incr SOB, cough, wheezing; CXR-NAD and we decided to try Depo, incr Pred to $Remov'20mg'UxMeXk$ /d & home NEB w/ XopenexTid. ~  10/14: she did not ret in 68mo as requested; family kept Ashley on Pred20, [place], Spiriva, Theo300, &  XopenexNEBS Tid; stable now but has nocturnal exac & ok to incr Xopenex to qid... ~  7/15: on Pred10-2tabsQam,  Theo300-1/2Bid, Dulera200-2Bid, Spiriva, Proair vs Xopenex NEBS prn; denies recent exac & review shows Levaquin called in 1/15 & 4/15 w/ good response;  Breathing well now w/o cough, sput, SOB, etc... ~  3/16: supposed to be on Pred10-2tabsQam, Theo300-1/2Bid, Dulera200-2Bid, Spiriva, Xopenex NEBS prn; son notes that she is using the NEBS w/ Xopenex Qid but not using Dulera/ Spiriva/ Theodur/ and won't take the Pred except maybe 2-3d per wk; I encouraged Ashley to take meds as prescribed & the Pred$RemoveB'20mg'LUyShTpi$ /d for now; we decided to continue the NEBS w/ Xopenex Qid & add BUDESONIDE 0.$RemoveBeforeDE'5mg'MUvyKhsfNuAoCLG$  Bid regularly.  HYPERTENSION (ICD-401.9) - back on LISINOPRIL $RemoveBefor'10mg'JppQpCLFbgGt$ /d'20mg'$  tab- 1/2); she had stopped all of Ashley meds on Ashley own June2012>  ~  cardiac cath 1991 was neg- norm coronaries (she may have had spasm). ~  labs 3/09 showed K=3.8, BUN=32, Creat=1.8 ~  labs 6/10 showed K=4.4, BUN=46, Creat=2.2.Marland Kitchen. rec> stop Maxzide & KCl, ROV 56mo. ~  labs 7/10 showed K=4.6, BUN=58, Creat=1.6.Marland Kitchen. rec> incr fluid intake, stay off Maxzide. ~  5/11:  BP=138/72, tol Rx well, no problems; denies HA, visual changes, CP, palipit, syncope, ch in dyspnea, etc; notes tr edema intermittently. ~  4/12:  BP= 138/84 & she remains essentially asymptomatic; K=3.9, BUN=25, Creat=1.4 ~  7/12:  BP= 122/82 off meds but poor intake, weak, wt loss, etc; K=3.3, BUN=23, Creat=1.3> rec K10caps two daily. ~  8/12:  BP= 110/72 back on Lisinopril $RemoveBefor'10mg'sholdshNpDmG$ /d due to incr BP at home; K=4.1 on 1tablesp KCL elixir daily. ~  12/12:  BP= 110/68 on Lisinopril monotherapy... Note: BNP= 61. ~  3/13:  BP= 94/60 on Lisin10 & asked to monitor BP at home & hold if BP<110 sys... ~  7/13:  BP= 142/82 on Lisin10 & asked to continue daily med Rx... ~  4/14: on Lisinopril10 + K10; BP=122/80 & she denies CP, palpit, syncope, edema; she is SOB due to the AB exac. ~  10/14: BP is stable on Lisin10 & measures 132/84 Norman... ~  7/15: on Lisinopril20-1/2 + K10; BP=122/80 & she denies CP,  palpit, syncope;  She has VI & dependent edema which goes down if she keeps Ashley legs elev, reminded to elim salt!  ~  3/16: on Lisinopril20-1/2 + K10; BP=120/84 & she denies cardiac symptoms...  ABNORMAL ELECTROCARDIOGRAM (ICD-794.31) - EKG shows NSR, IVCD, LAD, poor R progression... ~  2DEcho 5/11 ==> ordered but pt never returned to have this done...  CAROTID BRUIT (ICD-785.9) - back on ASA $Remo'81mg'uvQaC$ /d> she denies cerebral ischemic symptoms... ~  CDopplers 5/11 ==> ordered but pt never returned to have this done... ~  CDopplers completed 5/12 & showed mild plaque in bulbs, 0-39% bilat ICA stenoses, antegrade vertebral flow.  DIABETES MELLITUS (ICD-250.00) - back on METFORMIN but taking RIOMET $RemoveBefor'500mg'SWjPDQCcRjgH$ /30ml (1/2 tsp Bid) & appeared to tolerate well... ~  labs 1/08 showed BS=172, HgA1c=6.5 & Metformin $RemoveBef'500mg'yBNkMjqyfG$ /d started... ~  labs 3/09 showed BS= 99, A1c= 5.7 ~  labs 6/10 showed BS= 138, A1c= 6.0.Marland Kitchen. rec> same med. ~  labs 5/11 (wt=142#) showed BS= 141, A1c= 5.8 ~  Labs 4/12 (wt=129#) showed BS= 100, A1c= 5.9.Marland KitchenMarland Kitchen rec to decr Metformin to 1/2 tab each AM. ~  Labs 7/12 (wt=?<120#) & off all meds for 1+mo showed BS=77, A1c=6.2; rec continue MetformER- 1/2Qam (pt on Pred). ~  8/12:  Pt switched to Liq RIOMET 1/2 tsp Bid;  Labs  showed BS= 116 ~  Labs 12/12 on Riomet-1/2tspBid showed BS= 134 ~  Labs 7/13 on Riomet showed BS= 92, a1c= 5.6.Marland KitchenMarland Kitchen rec to STOP Riomet & Rx w/ diet alone for now... ~  4/14: off Riomet, on diet alone;  Labs Norman showed BS= 134; asked to incr oral fluids & attention to nutrition... ~  10/14: on Megace for appetite, BS=124, A1c wasn't done...  HYPOTHYROIDISM (ICD-244.9) - prev on LEVOTHYROID 75 micrograms/d (w/ good energy & clinically euthyroid); she stopped all meds on Ashley own 6/12; we restarted LEVOTHYROID 28mcg- 1/2 tab daily 7/12... ~  labs 1/08 showed TSH=1.84 on 75 micrograms/d... ~  labs 3/09 showed TSH= 5.05 ~  labs 6/10 showed TSH= 1.27 ~  labs 5/11 showed TSH= 0.64 ~  Labs  4/12 on Levo75 showed TSH= 1.61 ~  Labs 7/12 off meds showed TSH= 5.40 & rec to restart 1/2 of the 12mcg tab daily. ~  Labs 8/12 on Levo75-1/2 showed TSH= 3.47 & rec to continue same. ~  Labs 12/12 on Levothy75-1/2 showed TSH= 4.87 ~  Labs 7/13 on Levothy75-1/2 showed TSH= 3.97 ~  4/14: Med list indicates Levothyroid 81mcg- ?taking 1 tab daily? (they did not bring med bottles or list Norman)... ~  10/14: on Synthroid75-1/2 tab daily; Labs 0/14 showed TSH=5.11  ? of PRIMARY HYPERPARATHYROIDISM (ICD-252.01) - Ca++ levels in the 10.6-11.0 range... PO4- levels in the 2.1-2.4 range... PTH levels slowly rising 40-65... therefore following clinically w/ serial labs >> ~  labs 1/08 showed:  Ca++ 11.0, PTH= 42.4.Marland KitchenMarland KitchenMarland Kitchen ~  labs 3/09 showed Ca= 10.3, PO4= 3.2, PTH= 50 ~  labs 6/10 showed Ca= 10.9, PO4= 3.7, PTH= 65 ~  labs 5/11 showed Ca= 10.5-10.9, Phos= 3.0, PTH= 111... rec> continue to follow closely. ~  Labs 4/12 showed Ca= 11.0, PO4= not done, PTH= 80... NOT progressive- rec no calc supplements, incr fluids. ~  Labs 7/12 & 8/12 showed Ca= 9.9 ~  Labs 12/12 showed Ca= 10.2 ~  Labs 7/13 showed Ca= 9.9 ~  Labs 4/14 showed Ca= 10.3 ~  Labs 10/14 showed Ca= 10.3  GERD (ICD-530.81) - remote hx of PUD w/ GIB in the 80's... eval by DrDBrodie in the past... prev on Zantac and Reglan but hasn't filled these since 2009... denies nausea, vomiting, heartburn, diarrhea, constipation, blood in stool, abdominal pain, swelling, gas... she is rec to use OTC Prilosec vs Zantac Prn & avoid Tums etc... ~  7/12:  GI eval by DrDBrodie for adult FTT, wt loss & difficulty swallowing (?dysphagia for solids/pills/ liqs vs really just refusing to swallow)> Given Megace (pt wouldn't take it due to smell & taste), & did EGD> reported neg & dilatation performed...   COLONIC POLYPS (ICD-211.3) - there is a pos family history of colon cancer (in brother)... last colonoscopy was 5/98 [facility]) showing diminutive polyp (tubular  adenoma)... f/u planned 3-5 yrs, but she never went... she is overdue and needs to sched this important procedure but she has repeatedly refused GI referral...  RENAL INSUFFICIENCY (ICD-588.9)  ~  labs 1/08 showed BUN= 21, Creat= 1.2 ~  labs 3/09 showed BUN= 32, Creat= 1.8 ~  labs 6/10 showed BUN= 46, Creat= 2.2.Marland KitchenMarland Kitchen rec to stop Maxzide (on 1/2 daily) ~  labs 7/10 showed BUN= 58, Creat= 1.6.Marland Kitchen. rec> incr fluid intake. ~  labs 5/11 showed BUN= 28, Creat= 1.5 ~  Labs 4/12 showed BUN=25, Creat=1.4 ~  Labs 7/12 showed BUN= 23, Creat= 1.3 ~  Labs 8/12 showed BUN= 23, Creat= 1.0 ~  Labs 12/12 showed BUN= 19, Creat= 1.1 ~  Labs 7/13 showed BUN= 15, Creat= 1.1 ~  Labs 4/14 showed BUN= 40, Creat= 1.4 & she is not on a diuretic; asked to incr oral fluid intake... ~  Labs 10/14 showed BUN= 30, Creat= 1.3  HEADACHE (ICD-784.0)  DEGENERATIVE JOINT DISEASE (ICD-715.90) - mod DJD and walks w/ crutches "because of my ankles"... she's [facility] in the past... started on low dose Pred 6/10 for this & Asthma> improved & she weaned off the Pred on Ashley own... ~  6/10: CC= "my feet" c/o arthritic pain in feet & ankles... rec> trial Pred 39m/d w/ ROV 120mo~  7/10: pt states improved on the Pred which has been weaned... ~  7/12:  She is in wheelchair> can't vs won't stand ~  7/13:  Family states she's refused to stand, walk, exercise for ~108m69mow; exam shows swollen tender right knee; we will incr Pred20m65m& refer to Ortho. ~  4/14:  Family reports more of the same, and worsening FTT...  OSTEOPOROSIS (ICD-733.00) - currently taking ALENDRONATE 70mg52m no calcium supplement due to borderline hypercalcemia, MVI + Vit D 1000 u OTC supplement. ~  BMD 3/09 here showed TScores -2.9 in Spine, and -2.9 in right FemNeck... started on Alendronate 70mg/70m~  Vit D level 3/09 = 34... rec to start Vit D 1000 u OTC supplement. ~  Vit D level 6/10 = 50 ~  Vit D level 5/11 = 61 ~  5/12:  f/u BMD ==> TScores  showed -2.8 in Spine, and -2.7 in bilat FemNecks> Continue Alendronate, MVI, Vit D.  ANXIETY (ICD-300.00) >> she has a mild dementia as well w/ odd affect etc; on ZOLOFT 50mg/d35meems improved per family...  HEALTH MAINTENANCE: ~  GYN: she hasn't seen GYN (DrCollins) in yrs and needs PAP, Mammogram, etc (she has been reminded to set this up w/ each subseq visit here but declines to do so; asked to let us set [place] up for Ashley but she declines our assistance). ~  GI:  [facility]; advised f/u w/ each subseq visit here but she declines GI follow up; referral made to [facility] for this important f/u procedure but she never went. ~  Immunizations:  had Pneumovax 2001... Tetanus (TDAP) given 5/11... she refuses the seasonal Flu vaccines & is reminded of the importance of these yearly vaccinations.   Past Surgical History  Procedure Laterality Date  . Tonsillectomy and adenoidectomy    . Appendectomy      Outpatient Encounter Prescriptions as of 12/12/2014  Medication Sig  . aspirin 81 MG tablet Take 81 mg by mouth daily.    . Cholecalciferol (VITAMIN D) 1000 UNITS capsule Take 1,000 Units by mouth daily.    . ferrous sulfate 325 (65 FE) MG tablet Take 325 mg by mouth daily with breakfast.    . levalbuterol (XOPENEX) 0.63 MG/3ML nebulizer solution USE 1 VIAL 4 TIMES A DAY  . levothyroxine (SYNTHROID, LEVOTHROID) 75 MCG tablet take 1/2  tablet by mouth once daily  . lisinopril (PRINIVIL,ZESTRIL) 20 MG tablet take 1 tablet by mouth once daily  . LORazepam (ATIVAN) 1 MG tablet Take 0.5-1 tablets (0.5-1 mg total) by mouth 3 (three) times daily as needed for anxiety.  . megestrol (MEGACE ORAL) 40 MG/ML suspension Take 1 teaspoon by mouth three times daily with meals  . Multiple Vitamin (MULTIVITAMIN PO) Take 1 tablet by mouth daily.    .Marland Kitchen  predniSONE (DELTASONE) 10 MG tablet take 2 tablets by mouth once daily  . PROAIR HFA 108 (90 BASE) MCG/ACT inhaler  INHALE 2 PUFFS EVERY 6 HOURS IF NEEDED FOR WHEEZING  . sertraline (ZOLOFT) 50 MG tablet take 1 tablet by mouth once daily  . SPIRIVA HANDIHALER 18 MCG inhalation capsule inhale contents of 1 capsule by mouth once daily  . theophylline (THEODUR) 300 MG 12 hr tablet take 1 tablet by mouth once daily  . budesonide (PULMICORT) 0.5 MG/2ML nebulizer solution Take 2 mLs (0.5 mg total) by nebulization 2 (two) times daily.  . fluocinonide-emollient (LIDEX-E) 0.05 % cream Apply to affected area as directed.  . hydrOXYzine (ATARAX/VISTARIL) 10 MG tablet Take 1 tablet (10 mg total) by mouth 4 (four) times daily as needed for itching.  Marland Kitchen lisinopril (PRINIVIL,ZESTRIL) 20 MG tablet Take 10 mg by mouth daily.    . potassium chloride (K-DUR) 10 MEQ tablet Take 1 tablet (10 mEq total) by mouth daily.  . [DISCONTINUED] alendronate (FOSAMAX) 70 MG tablet TAKE 1 TABLET BY MOUTH EVERY WEEK  . [DISCONTINUED] fluticasone-salmeterol (ADVAIR HFA) 115-21 MCG/ACT inhaler Inhale 2 puffs into the lungs 2 (two) times daily. Rinse after each use.  . [DISCONTINUED] levofloxacin (LEVAQUIN) 25 MG/ML solution Take 10 mLs (250 mg total) by mouth 2 (two) times daily.  . [DISCONTINUED] levothyroxine (SYNTHROID, LEVOTHROID) 75 MCG tablet take 1 tablet by mouth once daily  . [DISCONTINUED] PROAIR HFA 108 (90 BASE) MCG/ACT inhaler inhale 2 puffs by mouth every 6 hours if needed for wheezing    Allergies  Allergen Reactions  . Amoxicillin-Pot Clavulanate     REACTION: rash, itching  . Cephalexin     REACTION: rash, itching  . Codeine     REACTION: rash, itching    Current Medications, Allergies, Past Medical History, Past Surgical History, Family History, and Social History were reviewed in [employer] record.    Review of Systems         See HPI - all other systems neg except as noted... The patient complains of difficulty walking, otherw she is very stoic & denies all symptoms.  The patient denies  anorexia, fever, weight loss, weight gain, vision loss, hoarseness, chest pain, syncope, peripheral edema, prolonged cough, headaches, hemoptysis, abdominal pain, melena, hematochezia, severe indigestion/heartburn, hematuria, incontinence, muscle weakness, suspicious skin lesions, transient blindness, depression, unusual weight change, abnormal bleeding, enlarged lymph nodes, and angioedema.     Objective:   Physical Exam     WD, Thin, 81 y/o WF, chronically ill appearing, now weaker, in wheelchair, difficulty standing unaided, & cachectic w/o focal localizing symptoms... GENERAL:  Alert & sl confused, less agitated & more alert... HEENT:  [place]/AT, EOM-wnl, PERRLA, EACs-clear, TMs-wnl, NOSE-clear, THROAT-clear & wnl. NECK:  Supple w/ fairROM; no JVD; normal carotid impulses w/ 1+ bilat bruits; no thyromegaly or nodules palpated; no lymphadenopathy. CHEST:  decr BS bilat, clear to P & A; without wheezes or rales; + scat rhonchi heard...  HEART:  Regular Rhythm; +gr1-2 sys murmur, no rubs or gallops detected... ABDOMEN:  Soft & nontender; normal bowel sounds; no organomegaly or masses palpated... EXT: without deformities, mild arthritic changes; no varicose veins/ +venous insuffic & 1+edema. NEURO:  CN's intact;  no focal neuro deficits, diffusely weak... DERM:  Excoriated rash on arms> chest> back; also has 2 water blister lesions on left heel area;  notes lipoma right arm & acrochordon right side of neck...  RADIOLOGY DATA:  Reviewed in the EPIC EMR & discussed w/  the patient...  LABORATORY DATA:  Reviewed in the EPIC EMR & discussed w/ the patient...   Assessment & Plan:    ADULT FTT>  FTT persists despite all efforts by [place] & family;  Continue Megace, ensure, encouragement;  They are not optimistic that she will walk again...   ASTHMA>  on Pred20, Theo300-1/2Bid, AdvairHFA115-2Bid, Spiriva, Xopenex NEBS Qid, Proair prn but family confirms poor compliance w/ oral meds & MDIs; Rec to use  NEBS w/ Xopenex Qid & BUDES0.45mBid; try to get Ashley to take the Pred 10-261mdaily for now...   HBP>  Controlled back on Ashley low dose ACE & we will continue to monitor Ashley BP as Ashley intake (hopefully) improves...   Abn EKG/ CBruit>  She never went for prev 2DEcho/ CDopplers (ordered 5/11) & seemed oblivious to recommendations; discussed w/ pt> stable on ASA 8148m & not interested in aggressive eval or treatments; She finally had CDopplers done 5/12 & they were OK= mild plaque & 0-39% ICA stenoses> rec continue ASA.  DM>  Off meds since 7/13 & BS= 124 on diet alone; eating fair & rec to avoid sugar & sweets...   Hypothy>  We had prev decr the Synthroid75 to 1/2 tab daily but she's back on 1 tab now, clinically euthyroid, asked to continue same & take med every day so we can assess efficacy...  ?Hyperpara>  Prev Ca up to 11 and PTH= 80 & not progressive, therefore continue to watch carefully & REC no calc supplements etc; Ca= 10.3 at last check & stable...  Hx Renal insuffic>  Creat is stable at 1.3 & I suspect due to poor oral intake- asked to incr oral fluids...  DJD/ Osteoporosis>  Mod severe DJD & now stable on Pred; f/u BMD on the Fosamax ==> sl improved, continue same.  DEPRESSION/ ?Psychiatric illness/ Senile Dementia/ Adult FTT/ Weakness> Change in personality, mental status, mood, family relations, etc over the past several yrs; we discussed plan that REQUIRES Ashley to take meds as outlined, take in adeq nutrition & Glucerna supplements, & drink fluids... On Zoloft 33m52m(when she will take it) & slowly improved...  Other medical issues as noted >> SED rate was elev at 67 ==> 22 on the Pred etc...   Patient's Medications  New Prescriptions   BUDESONIDE (PULMICORT) 0.5 MG/2ML NEBULIZER SOLUTION    Take 2 mLs (0.5 mg total) by nebulization 2 (two) times daily.   FLUOCINONIDE-EMOLLIENT (LIDEX-E) 0.05 % CREAM    Apply to affected area as directed.   HYDROXYZINE (ATARAX/VISTARIL) 10 MG  TABLET    Take 1 tablet (10 mg total) by mouth 4 (four) times daily as needed for itching.  Previous Medications   ASPIRIN 81 MG TABLET    Take 81 mg by mouth daily.     CHOLECALCIFEROL (VITAMIN D) 1000 UNITS CAPSULE    Take 1,000 Units by mouth daily.     FERROUS SULFATE 325 (65 FE) MG TABLET    Take 325 mg by mouth daily with breakfast.     LEVALBUTEROL (XOPENEX) 0.63 MG/3ML NEBULIZER SOLUTION    USE 1 VIAL 4 TIMES A DAY   LEVOTHYROXINE (SYNTHROID, LEVOTHROID) 75 MCG TABLET    take 1/2  tablet by mouth once daily   LISINOPRIL (PRINIVIL,ZESTRIL) 20 MG TABLET    Take 10 mg by mouth daily.     LISINOPRIL (PRINIVIL,ZESTRIL) 20 MG TABLET    take 1 tablet by mouth once daily   LORAZEPAM (ATIVAN) 1 MG TABLET  Take 0.5-1 tablets (0.5-1 mg total) by mouth 3 (three) times daily as needed for anxiety.   MEGESTROL (MEGACE ORAL) 40 MG/ML SUSPENSION    Take 1 teaspoon by mouth three times daily with meals   MULTIPLE VITAMIN (MULTIVITAMIN PO)    Take 1 tablet by mouth daily.     POTASSIUM CHLORIDE (K-DUR) 10 MEQ TABLET    Take 1 tablet (10 mEq total) by mouth daily.   PREDNISONE (DELTASONE) 10 MG TABLET    take 2 tablets by mouth once daily   PROAIR HFA 108 (90 BASE) MCG/ACT INHALER    INHALE 2 PUFFS EVERY 6 HOURS IF NEEDED FOR WHEEZING   SERTRALINE (ZOLOFT) 50 MG TABLET    take 1 tablet by mouth once daily   SPIRIVA HANDIHALER 18 MCG INHALATION CAPSULE    inhale contents of 1 capsule by mouth once daily   THEOPHYLLINE (THEODUR) 300 MG 12 HR TABLET    take 1 tablet by mouth once daily  Modified Medications   No medications on file  Discontinued Medications   ALENDRONATE (FOSAMAX) 70 MG TABLET    TAKE 1 TABLET BY MOUTH EVERY WEEK   FLUTICASONE-SALMETEROL (ADVAIR HFA) 115-21 MCG/ACT INHALER    Inhale 2 puffs into the lungs 2 (two) times daily. Rinse after each use.   LEVOFLOXACIN (LEVAQUIN) 25 MG/ML SOLUTION    Take 10 mLs (250 mg total) by mouth 2 (two) times daily.   LEVOTHYROXINE (SYNTHROID,  LEVOTHROID) 75 MCG TABLET    take 1 tablet by mouth once daily   PROAIR HFA 108 (90 BASE) MCG/ACT INHALER    inhale 2 puffs by mouth every 6 hours if needed for wheezing

## 2014-12-12 NOTE — Patient Instructions (Signed)
Today we updated your med list in our EPIC system...    Continue your current medications the same...  Try to get her to take her meds regularly...  We decided to add BUDESONIDE 0.5mg  for NEBULIZER twice daily...    Continue the Xopenex (Levalbuterol) 4 times daily...  For the itching/ rash>    Try the ATARAX (Hydroxyzine) 25mg  tabs- take 1 tab up to 4 times daily as needed for itching...    Try the LIDEX-E cream to rash as well...    We will need to refer to Dermatology if not responding...  For the left heel water blisters>    KEEP PRESSURE OFF THIS AREA    Clean w/ mild soapy ewater twice daily...    Pat dry & cover w/ dry gauze pads & Kerlex vs Kling gauze wrap...    We will arrange for referral to the wound care clinic....  Call for any questions & keep me informed of her progress.Marland KitchenMarland Kitchen

## 2014-12-17 ENCOUNTER — Other Ambulatory Visit: Payer: Self-pay | Admitting: Pulmonary Disease

## 2014-12-18 NOTE — Telephone Encounter (Signed)
Pt was seen 12/12/14 proair was not refilled. Refilled #1 2 rf.

## 2014-12-23 ENCOUNTER — Other Ambulatory Visit: Payer: Self-pay | Admitting: Pulmonary Disease

## 2014-12-24 ENCOUNTER — Telehealth: Payer: Self-pay | Admitting: Pulmonary Disease

## 2014-12-24 NOTE — Telephone Encounter (Signed)
Spoke with the pharmacist and verified that xopenex nebs should be taken qid  Nothing further needed

## 2014-12-30 ENCOUNTER — Encounter (HOSPITAL_BASED_OUTPATIENT_CLINIC_OR_DEPARTMENT_OTHER): Payer: Medicare Other | Attending: General Surgery

## 2014-12-31 ENCOUNTER — Other Ambulatory Visit: Payer: Self-pay | Admitting: Pulmonary Disease

## 2015-02-25 ENCOUNTER — Other Ambulatory Visit: Payer: Self-pay | Admitting: Pulmonary Disease

## 2015-03-03 ENCOUNTER — Telehealth: Payer: Self-pay | Admitting: Pulmonary Disease

## 2015-03-03 MED ORDER — FLUCONAZOLE 100 MG PO TABS: ORAL_TABLET | ORAL | Status: AC

## 2015-03-03 NOTE — Telephone Encounter (Signed)
Patients daughter says that patient is complaining of having trouble swallowing.  Daughter looked in her mouth and her tongue and throat has white spots.  She is concerned it may be thrush.  There has been a lot of children around patient that has had strep and ear infections.  Daughter wants to know if patient can get antibiotic.  Would like liquid form since she is having trouble swallowing. ([facility] - [place])  Allergies  Allergen Reactions  . Amoxicillin-Pot Clavulanate     REACTION: rash, itching  . Cephalexin     REACTION: rash, itching  . Codeine     REACTION: rash, itching

## 2015-03-03 NOTE — Telephone Encounter (Signed)
Per SN, Rx for Diflucan 100mg  # 8 with instructions of 2 pills po now and then 1 pill po daily until gone was sent to pt's pharmacy. Message was left for pt's daughter about medication. Nothing further is needed at this time

## 2015-04-08 ENCOUNTER — Encounter (HOSPITAL_BASED_OUTPATIENT_CLINIC_OR_DEPARTMENT_OTHER): Payer: Medicare Other | Attending: Surgery

## 2015-04-08 DIAGNOSIS — I10 Essential (primary) hypertension: Secondary | ICD-10-CM | POA: Insufficient documentation

## 2015-04-08 DIAGNOSIS — J449 Chronic obstructive pulmonary disease, unspecified: Secondary | ICD-10-CM | POA: Diagnosis not present

## 2015-04-08 DIAGNOSIS — M109 Gout, unspecified: Secondary | ICD-10-CM | POA: Diagnosis not present

## 2015-04-08 DIAGNOSIS — L120 Bullous pemphigoid: Secondary | ICD-10-CM | POA: Diagnosis present

## 2015-04-08 DIAGNOSIS — J45909 Unspecified asthma, uncomplicated: Secondary | ICD-10-CM | POA: Insufficient documentation

## 2015-04-08 DIAGNOSIS — N289 Disorder of kidney and ureter, unspecified: Secondary | ICD-10-CM | POA: Diagnosis not present

## 2015-04-08 DIAGNOSIS — E0590 Thyrotoxicosis, unspecified without thyrotoxic crisis or storm: Secondary | ICD-10-CM | POA: Insufficient documentation

## 2015-04-08 DIAGNOSIS — M1990 Unspecified osteoarthritis, unspecified site: Secondary | ICD-10-CM | POA: Insufficient documentation

## 2015-04-08 DIAGNOSIS — E119 Type 2 diabetes mellitus without complications: Secondary | ICD-10-CM | POA: Diagnosis not present

## 2015-04-08 DIAGNOSIS — F0390 Unspecified dementia without behavioral disturbance: Secondary | ICD-10-CM | POA: Insufficient documentation

## 2015-04-08 DIAGNOSIS — K219 Gastro-esophageal reflux disease without esophagitis: Secondary | ICD-10-CM | POA: Diagnosis not present

## 2015-04-08 LAB — GLUCOSE, CAPILLARY: Glucose-Capillary: 144 mg/dL — ABNORMAL HIGH (ref 65–99)

## 2015-04-17 ENCOUNTER — Other Ambulatory Visit: Payer: Self-pay | Admitting: Pulmonary Disease

## 2015-04-26 ENCOUNTER — Other Ambulatory Visit: Payer: Self-pay | Admitting: Pulmonary Disease

## 2015-05-29 ENCOUNTER — Other Ambulatory Visit: Payer: Self-pay | Admitting: Pulmonary Disease

## 2015-07-20 ENCOUNTER — Other Ambulatory Visit: Payer: Self-pay | Admitting: Pulmonary Disease

## 2015-09-04 ENCOUNTER — Other Ambulatory Visit: Payer: Self-pay | Admitting: Pulmonary Disease

## 2015-11-03 ENCOUNTER — Telehealth: Payer: Self-pay | Admitting: Pulmonary Disease

## 2015-11-03 NOTE — Telephone Encounter (Signed)
Per SN: Offer OV with SN or eval in ED Other alternative would be a hospice eval.  Spoke with pt's daughter and advised of Dr Jeannine Kitten recommendations.  She wants to discuss this with her brother and she will call us back tomorrow.

## 2015-11-03 NOTE — Telephone Encounter (Signed)
Spoke with pt's daughter. She reports that pt's appetite has worsened over the past 3-4 days.  Still taking Megace tid.  Not eating much food and only taking in some liquids.  Sleeping more during the day with some increased confusion.  Denies any changes in breathing.  Using nebs prn.  Daughter wanting to know if Dr Lenna Gilford has any further recommendations regarding her appetite, etc.  Please advise

## 2015-11-05 ENCOUNTER — Telehealth: Payer: Self-pay | Admitting: Pulmonary Disease

## 2015-11-05 MED ORDER — AZITHROMYCIN 200 MG/5ML PO SUSR: ORAL | Status: AC

## 2015-11-05 NOTE — Telephone Encounter (Signed)
Called spoke with daughter Butch Penny. She is aware of recs. RX has been sent in. nothing further needed

## 2015-11-05 NOTE — Telephone Encounter (Signed)
Offer azithromycin 200 mg/ 5 ml liquid.    Take 10 ml by mouth on first day, then 5 ml by mouth daily x 4 days.

## 2015-11-05 NOTE — Telephone Encounter (Signed)
Called spoke with Daughter donna. She reports pt has been exposed to 2 people that have strept throat. Now pt is c/o sore throat, feeling cold and no appetite. Not sure if she has fever. Has very little dry cough and wheezing. Offered appt but reports pt is too weak to come for an appointment. Wants liquid ABX called in for pt. Please advise Dr. Lenna Gilford thanks  Allergies  Allergen Reactions  . Amoxicillin-Pot Clavulanate     REACTION: rash, itching  . Cephalexin     REACTION: rash, itching  . Codeine     REACTION: rash, itching     Current Outpatient Prescriptions on File Prior to Visit  Medication Sig Dispense Refill  . aspirin 81 MG tablet Take 81 mg by mouth daily.      . budesonide (PULMICORT) 0.5 MG/2ML nebulizer solution Take 2 mLs (0.5 mg total) by nebulization 2 (two) times daily. 120 mL 12  . Cholecalciferol (VITAMIN D) 1000 UNITS capsule Take 1,000 Units by mouth daily.      . ferrous sulfate 325 (65 FE) MG tablet Take 325 mg by mouth daily with breakfast.      . fluconazole (DIFLUCAN) 100 MG tablet Take 2 pills by mouth now, and then 1 pill by mouth daily until gone. 8 tablet 0  . fluocinonide-emollient (LIDEX-E) 0.05 % cream apply to affected area as directed 60 g 2  . hydrOXYzine (ATARAX/VISTARIL) 10 MG tablet Take 1 tablet (10 mg total) by mouth 4 (four) times daily as needed for itching. 100 tablet 0  . levalbuterol (XOPENEX) 0.63 MG/3ML nebulizer solution USE 1 VIAL 4 TIMES A DAY 300 mL 11  . levothyroxine (SYNTHROID, LEVOTHROID) 75 MCG tablet take 1/2  tablet by mouth once daily    . levothyroxine (SYNTHROID, LEVOTHROID) 75 MCG tablet take 1 tablet by mouth once daily 30 tablet 3  . lisinopril (PRINIVIL,ZESTRIL) 20 MG tablet Take 10 mg by mouth daily.      Marland Kitchen lisinopril (PRINIVIL,ZESTRIL) 20 MG tablet take 1 tablet by mouth once daily 30 tablet 6  . LORazepam (ATIVAN) 1 MG tablet Take 0.5-1 tablets (0.5-1 mg total) by mouth 3 (three) times daily as needed for anxiety. 90  tablet 0  . megestrol (MEGACE) 40 MG/ML suspension TAKE 1 TEASPOON BY MOUTH THREE TIMES A DAY 240 mL PRN  . Multiple Vitamin (MULTIVITAMIN PO) Take 1 tablet by mouth daily.      . potassium chloride (K-DUR) 10 MEQ tablet Take 1 tablet (10 mEq total) by mouth daily. 30 tablet 6  . predniSONE (DELTASONE) 10 MG tablet take 2 tablet by mouth once daily 60 tablet 2  . PROAIR HFA 108 (90 BASE) MCG/ACT inhaler INHALE 2 PUFFS EVERY 6 HOURS IF NEEDED FOR WHEEZING 8.5 g 1  . PROAIR HFA 108 (90 BASE) MCG/ACT inhaler inhale 2 puffs by mouth every 6 hours if needed for wheezing 8.5 Inhaler 3  . sertraline (ZOLOFT) 50 MG tablet take 1 tablet by mouth once daily 30 tablet 5  . SPIRIVA HANDIHALER 18 MCG inhalation capsule inhale contents of 1 capsule by mouth once daily 30 capsule 11  . theophylline (THEODUR) 300 MG 12 hr tablet TAKE 1 TABLET BY MOUTH DAILY 30 tablet 3   No current facility-administered medications on file prior to visit.

## 2015-11-05 NOTE — Telephone Encounter (Signed)
SN already gone for the day. Please advise Dr. Annamaria Boots thanks

## 2015-11-13 ENCOUNTER — Inpatient Hospital Stay (HOSPITAL_COMMUNITY)
Admission: EM | Admit: 2015-11-13 | Discharge: 2015-11-25 | DRG: 871 | Disposition: E | Payer: Medicare Other | Attending: Internal Medicine | Admitting: Internal Medicine

## 2015-11-13 ENCOUNTER — Emergency Department (HOSPITAL_COMMUNITY): Payer: Medicare Other

## 2015-11-13 ENCOUNTER — Encounter (HOSPITAL_COMMUNITY): Payer: Self-pay | Admitting: Emergency Medicine

## 2015-11-13 DIAGNOSIS — I129 Hypertensive chronic kidney disease with stage 1 through stage 4 chronic kidney disease, or unspecified chronic kidney disease: Secondary | ICD-10-CM | POA: Diagnosis present

## 2015-11-13 DIAGNOSIS — R627 Adult failure to thrive: Secondary | ICD-10-CM | POA: Diagnosis not present

## 2015-11-13 DIAGNOSIS — J45909 Unspecified asthma, uncomplicated: Secondary | ICD-10-CM | POA: Diagnosis present

## 2015-11-13 DIAGNOSIS — J4540 Moderate persistent asthma, uncomplicated: Secondary | ICD-10-CM | POA: Diagnosis not present

## 2015-11-13 DIAGNOSIS — J181 Lobar pneumonia, unspecified organism: Secondary | ICD-10-CM | POA: Diagnosis not present

## 2015-11-13 DIAGNOSIS — M810 Age-related osteoporosis without current pathological fracture: Secondary | ICD-10-CM | POA: Diagnosis present

## 2015-11-13 DIAGNOSIS — R0902 Hypoxemia: Secondary | ICD-10-CM | POA: Diagnosis not present

## 2015-11-13 DIAGNOSIS — Z681 Body mass index (BMI) 19 or less, adult: Secondary | ICD-10-CM | POA: Diagnosis not present

## 2015-11-13 DIAGNOSIS — R7303 Prediabetes: Secondary | ICD-10-CM | POA: Diagnosis present

## 2015-11-13 DIAGNOSIS — B029 Zoster without complications: Secondary | ICD-10-CM | POA: Diagnosis present

## 2015-11-13 DIAGNOSIS — Z79818 Long term (current) use of other agents affecting estrogen receptors and estrogen levels: Secondary | ICD-10-CM

## 2015-11-13 DIAGNOSIS — E876 Hypokalemia: Secondary | ICD-10-CM | POA: Diagnosis present

## 2015-11-13 DIAGNOSIS — A419 Sepsis, unspecified organism: Secondary | ICD-10-CM | POA: Diagnosis present

## 2015-11-13 DIAGNOSIS — R5381 Other malaise: Secondary | ICD-10-CM | POA: Diagnosis present

## 2015-11-13 DIAGNOSIS — Z66 Do not resuscitate: Secondary | ICD-10-CM | POA: Diagnosis present

## 2015-11-13 DIAGNOSIS — E039 Hypothyroidism, unspecified: Secondary | ICD-10-CM | POA: Diagnosis present

## 2015-11-13 DIAGNOSIS — J850 Gangrene and necrosis of lung: Secondary | ICD-10-CM | POA: Diagnosis present

## 2015-11-13 DIAGNOSIS — N183 Chronic kidney disease, stage 3 unspecified: Secondary | ICD-10-CM | POA: Diagnosis present

## 2015-11-13 DIAGNOSIS — I472 Ventricular tachycardia: Secondary | ICD-10-CM | POA: Diagnosis not present

## 2015-11-13 DIAGNOSIS — F419 Anxiety disorder, unspecified: Secondary | ICD-10-CM | POA: Diagnosis present

## 2015-11-13 DIAGNOSIS — N179 Acute kidney failure, unspecified: Secondary | ICD-10-CM | POA: Diagnosis present

## 2015-11-13 DIAGNOSIS — Z7189 Other specified counseling: Secondary | ICD-10-CM | POA: Insufficient documentation

## 2015-11-13 DIAGNOSIS — E43 Unspecified severe protein-calorie malnutrition: Secondary | ICD-10-CM | POA: Insufficient documentation

## 2015-11-13 DIAGNOSIS — Z9119 Patient's noncompliance with other medical treatment and regimen: Secondary | ICD-10-CM

## 2015-11-13 DIAGNOSIS — Z7951 Long term (current) use of inhaled steroids: Secondary | ICD-10-CM

## 2015-11-13 DIAGNOSIS — J189 Pneumonia, unspecified organism: Secondary | ICD-10-CM | POA: Diagnosis not present

## 2015-11-13 DIAGNOSIS — Z515 Encounter for palliative care: Secondary | ICD-10-CM | POA: Insufficient documentation

## 2015-11-13 DIAGNOSIS — L8990 Pressure ulcer of unspecified site, unspecified stage: Secondary | ICD-10-CM | POA: Insufficient documentation

## 2015-11-13 DIAGNOSIS — R5383 Other fatigue: Secondary | ICD-10-CM | POA: Diagnosis not present

## 2015-11-13 DIAGNOSIS — F0390 Unspecified dementia without behavioral disturbance: Secondary | ICD-10-CM | POA: Diagnosis present

## 2015-11-13 DIAGNOSIS — I10 Essential (primary) hypertension: Secondary | ICD-10-CM | POA: Diagnosis present

## 2015-11-13 DIAGNOSIS — T17908A Unspecified foreign body in respiratory tract, part unspecified causing other injury, initial encounter: Secondary | ICD-10-CM

## 2015-11-13 LAB — COMPREHENSIVE METABOLIC PANEL
ALBUMIN: 2.2 g/dL — AB (ref 3.5–5.0)
ALK PHOS: 77 U/L (ref 38–126)
ALT: 21 U/L (ref 14–54)
ANION GAP: 14 (ref 5–15)
AST: 24 U/L (ref 15–41)
BILIRUBIN TOTAL: 0.7 mg/dL (ref 0.3–1.2)
BUN: 52 mg/dL — ABNORMAL HIGH (ref 6–20)
CALCIUM: 10.7 mg/dL — AB (ref 8.9–10.3)
CO2: 30 mmol/L (ref 22–32)
Chloride: 98 mmol/L — ABNORMAL LOW (ref 101–111)
Creatinine, Ser: 1.37 mg/dL — ABNORMAL HIGH (ref 0.44–1.00)
GFR, EST AFRICAN AMERICAN: 40 mL/min — AB (ref >60)
GFR, EST NON AFRICAN AMERICAN: 34 mL/min — AB (ref >60)
Glucose, Bld: 184 mg/dL — ABNORMAL HIGH (ref 65–99)
POTASSIUM: 4.5 mmol/L (ref 3.5–5.1)
Sodium: 142 mmol/L (ref 135–145)
TOTAL PROTEIN: 7.4 g/dL (ref 6.5–8.1)

## 2015-11-13 LAB — CBC WITH DIFFERENTIAL/PLATELET
BASOS PCT: 0 %
Basophils Absolute: 0 10*3/uL (ref 0.0–0.1)
Eosinophils Absolute: 0.1 10*3/uL (ref 0.0–0.7)
Eosinophils Relative: 1 %
HEMATOCRIT: 41 % (ref 36.0–46.0)
HEMOGLOBIN: 13.8 g/dL (ref 12.0–15.0)
LYMPHS ABS: 1.9 10*3/uL (ref 0.7–4.0)
Lymphocytes Relative: 10 %
MCH: 28.2 pg (ref 26.0–34.0)
MCHC: 33.7 g/dL (ref 30.0–36.0)
MCV: 83.8 fL (ref 78.0–100.0)
MONO ABS: 1 10*3/uL (ref 0.1–1.0)
MONOS PCT: 6 %
NEUTROS ABS: 15.4 10*3/uL — AB (ref 1.7–7.7)
NEUTROS PCT: 83 %
Platelets: 452 10*3/uL — ABNORMAL HIGH (ref 150–400)
RBC: 4.89 MIL/uL (ref 3.87–5.11)
RDW: 14.8 % (ref 11.5–15.5)
WBC: 18.4 10*3/uL — ABNORMAL HIGH (ref 4.0–10.5)

## 2015-11-13 LAB — I-STAT CG4 LACTIC ACID, ED: LACTIC ACID, VENOUS: 2.75 mmol/L — AB (ref 0.5–2.0)

## 2015-11-13 MED ORDER — LEVOFLOXACIN IN D5W 750 MG/150ML IV SOLN
750.0000 mg | Freq: Once | INTRAVENOUS | Status: AC
  Administered 2015-11-13: 750 mg via INTRAVENOUS
  Filled 2015-11-13: qty 150

## 2015-11-13 MED ORDER — SODIUM CHLORIDE 0.9 % IV BOLUS (SEPSIS)
1000.0000 mL | Freq: Once | INTRAVENOUS | Status: AC
  Administered 2015-11-13: 1000 mL via INTRAVENOUS

## 2015-11-13 MED ORDER — LEVOFLOXACIN IN D5W 500 MG/100ML IV SOLN
500.0000 mg | INTRAVENOUS | Status: DC
  Administered 2015-11-15: 500 mg via INTRAVENOUS
  Filled 2015-11-13: qty 100

## 2015-11-13 MED ORDER — SODIUM CHLORIDE 0.9 % IV BOLUS (SEPSIS)
500.0000 mL | INTRAVENOUS | Status: AC
  Administered 2015-11-14: 500 mL via INTRAVENOUS

## 2015-11-13 MED ORDER — VANCOMYCIN HCL IN DEXTROSE 1-5 GM/200ML-% IV SOLN
1000.0000 mg | Freq: Once | INTRAVENOUS | Status: AC
  Administered 2015-11-13: 1000 mg via INTRAVENOUS
  Filled 2015-11-13: qty 200

## 2015-11-13 MED ORDER — VANCOMYCIN HCL 500 MG IV SOLR
500.0000 mg | INTRAVENOUS | Status: DC
  Administered 2015-11-15 (×2): 500 mg via INTRAVENOUS
  Filled 2015-11-13 (×3): qty 500

## 2015-11-13 NOTE — ED Notes (Addendum)
Pt.'s family  reported worsening generalized weakness , poor oral intake and  lethargy for several days , hypotensive/lethargic at triage .

## 2015-11-13 NOTE — Progress Notes (Signed)
Pharmacy Code Sepsis Protocol  Time of code sepsis page: 2122 [x]  Antibiotics delivered at 2125 []  Antibiotics administered prior to code at  (if checked, omit next 2 questions)  Were antibiotics ordered at the time of the code sepsis page? Yes Was it required to contact the physician? [x]  Physician not contacted []  Physician contacted to order antibiotics for code sepsis []  Physician contacted to recommend changing antibiotics  Pharmacy consulted for: vancomycin and levaquin  Anti-infectives    Start     Dose/Rate Route Frequency Ordered Stop   11/17/2015 2115  levofloxacin (LEVAQUIN) IVPB 750 mg     750 mg 100 mL/hr over 90 Minutes Intravenous  Once 11/03/2015 2110     11/24/2015 2115  vancomycin (VANCOCIN) IVPB 1000 mg/200 mL premix     1,000 mg 200 mL/hr over 60 Minutes Intravenous  Once 11/04/2015 2111          Nurse education provided: []  Minutes left to administer antibiotics to achieve 1 hour goal []  Correct order of antibiotic administration []  Antibiotic Y-site compatibilities     Jakhari Space, Rande Lawman, PharmD 11/16/2015, 9:30 PM

## 2015-11-13 NOTE — ED Provider Notes (Signed)
CSN: [id number]     Arrival date & time 10/30/2015  2015 History   First MD Initiated Contact with Patient 11/05/2015 2053     Chief Complaint  Patient presents with  . Weakness   level V caveat: Decreased level consciousness HPI Pt has had trouble with a decline in her health over several months.  She has been eating less, losing weight and has been less active and less responsive. However, over the past week the patient has been acutely worse. She was exposed to people with strep throat so the primary care doctor called in an azithromycin prescription. Her symptoms have not improved and she has become more lethargic. Family members state that today she has not spoken to them at all.  She has felt warm but no definite fevers. She has been coughing. No vomiting or diarrhea Past Medical History  Diagnosis Date  . Asthma   . Hypertension   . Abnormal electrocardiogram   . Carotid bruit   . Diabetes mellitus   . Hypothyroidism   . Primary hyperparathyroidism ([place])   . GERD (gastroesophageal reflux disease)   . Hx of colonic polyps   . Renal insufficiency   . Headache(784.0)   . DJD (degenerative joint disease)   . Osteoporosis   . Anxiety    Past Surgical History  Procedure Laterality Date  . Tonsillectomy and adenoidectomy    . Appendectomy     Family History  Problem Relation Age of Onset  . Cerebral aneurysm Mother     hemorrhage/deceased at 60  . Heart failure Father     deceased age 53  . Heart failure Sister   . Lung cancer Brother   . Leukemia Brother   . Colon cancer Brother   . Stomach cancer Brother    Social History  Substance Use Topics  . Smoking status: Never Smoker   . Smokeless tobacco: Never Used  . Alcohol Use: No   OB History    No data available     Review of Systems  Unable to perform ROS: Dementia      Allergies  Amoxicillin-pot clavulanate; Cephalexin; and Codeine  Home Medications   Prior to Admission medications   Medication Sig Start  Date End Date Taking? Authorizing Provider  budesonide (PULMICORT) 0.5 MG/2ML nebulizer solution Take 2 mLs (0.5 mg total) by nebulization 2 (two) times daily. 12/12/14  Yes Noralee Space, MD  fluocinonide-emollient (LIDEX-E) 0.05 % cream Apply 1 application topically daily as needed. Apply to spots per daughter   Yes Historical Provider, MD  levalbuterol Penne Lash) 0.63 MG/3ML nebulizer solution USE 1 VIAL 4 TIMES A DAY 12/24/14  Yes Noralee Space, MD  megestrol (MEGACE) 40 MG/ML suspension TAKE 1 TEASPOON BY MOUTH THREE TIMES A DAY 07/20/15  Yes Noralee Space, MD  Multiple Vitamin (MULTIVITAMIN PO) Take 1 tablet by mouth daily.     Yes Historical Provider, MD  PROAIR HFA 108 (90 BASE) MCG/ACT inhaler INHALE 2 PUFFS EVERY 6 HOURS IF NEEDED FOR WHEEZING 10/03/14  Yes Noralee Space, MD  SPIRIVA HANDIHALER 18 MCG inhalation capsule inhale contents of 1 capsule by mouth once daily 08/03/13  Yes Noralee Space, MD  azithromycin [facility]) 200 MG/5ML suspension Take 10 ml by mouth on first day, then 5 ml by mouth daily x 4 days. Patient not taking: Reported on 11/01/2015 11/05/15   Deneise Lever, MD  fluconazole (DIFLUCAN) 100 MG tablet Take 2 pills by mouth now, and then 1 pill  by mouth daily until gone. Patient not taking: Reported on 11/20/2015 03/03/15   Noralee Space, MD  fluocinonide-emollient (LIDEX-E) 0.05 % cream apply to affected area as directed Patient not taking: Reported on 11/06/2015 01/01/15   Noralee Space, MD  hydrOXYzine (ATARAX/VISTARIL) 10 MG tablet Take 1 tablet (10 mg total) by mouth 4 (four) times daily as needed for itching. Patient not taking: Reported on 11/07/2015 12/12/14   Noralee Space, MD  levothyroxine (SYNTHROID, LEVOTHROID) 75 MCG tablet take 1 tablet by mouth once daily Patient not taking: Reported on 10/29/2015 02/27/15   Noralee Space, MD  lisinopril (PRINIVIL,ZESTRIL) 20 MG tablet Take 10 mg by mouth daily.   05/13/11 03/31/14  Noralee Space, MD  lisinopril (PRINIVIL,ZESTRIL) 20 MG  tablet take 1 tablet by mouth once daily Patient not taking: Reported on 11/15/2015    Noralee Space, MD  LORazepam (ATIVAN) 1 MG tablet Take 0.5-1 tablets (0.5-1 mg total) by mouth 3 (three) times daily as needed for anxiety. Patient not taking: Reported on 10/30/2015 01/07/13   Noralee Space, MD  potassium chloride (K-DUR) 10 MEQ tablet Take 1 tablet (10 mEq total) by mouth daily. 06/11/13 06/11/14  Noralee Space, MD  predniSONE (DELTASONE) 10 MG tablet take 2 tablet by mouth once daily Patient not taking: Reported on 11/20/2015 02/27/15   Noralee Space, MD  PROAIR HFA 108 (90 BASE) MCG/ACT inhaler inhale 2 puffs by mouth every 6 hours if needed for wheezing Patient not taking: Reported on 11/14/2015 09/07/15   Noralee Space, MD  sertraline (ZOLOFT) 50 MG tablet take 1 tablet by mouth once daily Patient not taking: Reported on 11/21/2015 06/08/13   Noralee Space, MD  theophylline (THEODUR) 300 MG 12 hr tablet TAKE 1 TABLET BY MOUTH DAILY Patient not taking: Reported on 11/07/2015 04/27/15   Noralee Space, MD   BP 112/62 mmHg  Pulse 84  Temp(Src) 99.4 F (37.4 C) (Rectal)  Resp 26  SpO2 100% Physical Exam  Constitutional: No distress.  Elderly, frail  HENT:  Head: Normocephalic and atraumatic.  Right Ear: External ear normal.  Left Ear: External ear normal.  Mouth/Throat: No oropharyngeal exudate.  Mucous membranes are dry  Eyes: Conjunctivae are normal. Right eye exhibits no discharge. Left eye exhibits no discharge. No scleral icterus.  Neck: Neck supple. No tracheal deviation present.  Cardiovascular: Normal rate, regular rhythm and intact distal pulses.   Pulmonary/Chest: Breath sounds normal. Accessory muscle usage present. No stridor. Tachypnea noted. No respiratory distress. She has no wheezes. She has no rales.  Abdominal: Soft. Bowel sounds are normal. She exhibits no distension. There is no tenderness. There is no rebound and no guarding.  Musculoskeletal: She exhibits no edema or  tenderness.  Decreased muscle mass  Neurological: She is alert. She has normal strength. No cranial nerve deficit (no facial droop, extraocular movements intact, no slurred speech) or sensory deficit. She exhibits normal muscle tone. She displays no seizure activity. Coordination normal. GCS eye subscore is 3. GCS verbal subscore is 4. GCS motor subscore is 5.  Skin: Skin is warm and dry. No rash noted.  Decreased skin turgor  Nursing note and vitals reviewed.   ED Course  Procedures  CRITICAL CARE Performed by: [id number] Total critical care time: 30 minutes Critical care time was exclusive of separately billable procedures and treating other patients. Critical care was necessary to treat or prevent imminent or life-threatening deterioration. Critical care was time spent personally by  me on the following activities: development of treatment plan with patient and/or surrogate as well as nursing, discussions with consultants, evaluation of patient's response to treatment, examination of patient, obtaining history from patient or surrogate, ordering and performing treatments and interventions, ordering and review of laboratory studies, ordering and review of radiographic studies, pulse oximetry and re-evaluation of patient's condition.  Labs Review Labs Reviewed  CBC WITH DIFFERENTIAL/PLATELET - Abnormal; Notable for the following:    WBC 18.4 (*)    Platelets 452 (*)    Neutro Abs 15.4 (*)    All other components within normal limits  COMPREHENSIVE METABOLIC PANEL - Abnormal; Notable for the following:    Chloride 98 (*)    Glucose, Bld 184 (*)    BUN 52 (*)    Creatinine, Ser 1.37 (*)    Calcium 10.7 (*)    Albumin 2.2 (*)    GFR calc non Af Amer 34 (*)    GFR calc Af Amer 40 (*)    All other components within normal limits  I-STAT CG4 LACTIC ACID, ED - Abnormal; Notable for the following:    Lactic Acid, Venous 2.75 (*)    All other components within normal limits  CULTURE, BLOOD  (ROUTINE X 2)  CULTURE, BLOOD (ROUTINE X 2)  URINE CULTURE  URINALYSIS, ROUTINE W REFLEX MICROSCOPIC (NOT AT [facility])  QUANTIFERON TB GOLD ASSAY (BLOOD)  I-STAT CG4 LACTIC ACID, ED  Randolm Idol, ED    Imaging Review Dg Chest Portable 1 View  11/21/2015  CLINICAL DATA:  Generalized weakness and lethargy for 1 week. EXAM: PORTABLE CHEST 1 VIEW COMPARISON:  01/04/2013. FINDINGS: Interval large area of increased density and cavitation with irregular walls in the upper half of the left hemithorax. The right lung remains clear and mildly hyperexpanded. The cardiac silhouette remains borderline enlarged. Diffuse osteopenia. IMPRESSION: 1. Interval large area of soft tissue density and cavitation in the upper half of the left hemithorax. Differential considerations include tuberculosis infection, fungal infection, cavitary pneumonia and cavitary malignancy. 2. Stable mild changes of COPD. Electronically Signed   By: Claudie Revering M.D.   On: 10/30/2015 21:13   I have personally reviewed and evaluated these images and lab results as part of my medical decision-making.   EKG Interpretation   Date/Time:  Friday November 13 2015 21:14:14 EST Ventricular Rate:  91 PR Interval:  150 QRS Duration: 103 QT Interval:  363 QTC Calculation: 447 R Axis:   -63 Text Interpretation:  Sinus rhythm Biatrial enlargement LAD, consider left  anterior fascicular block Abnormal R-wave progression, early transition  Consider anterior infarct Abnrm T, consider ischemia, anterolateral lds No  old tracing to compare Confirmed by Carrissa Taitano  MD-J, Lasha Echeverria [id number]) on 11/21/2015  11:17:28 PM      MDM   Final diagnoses:  CAP (community acquired pneumonia)    The patient's x-ray shows a large cavitation in the left upper chest. Differential includes possibility of cavitary pneumonia or malignancy.  I have started patient on IV antibiotics. In terms are also concerning for the possibility of sepsis. Laboratory tests and fluid  resuscitation of been ordered.   Labs shows elevated WBC count and renal insufficiency.    Plan on continued aggressive fluid resuscitation.  Admit to the hospital for further treatment.  I discussed goals of care with the family. Previously her primary doctor was discussing hospice. I have made the patient DO NOT RESUSCITATE. We will continue with IV fluids and antibiotics.    Dorie Rank, MD  10/31/2015 2329 

## 2015-11-13 NOTE — Progress Notes (Signed)
Pharmacy Antibiotic Note  Ashley Norman is a 82 y.o. female admitted on 11/20/2015 with sepsis.  Pharmacy has been consulted for vancomycin and levaquin dosing. Pt is afebrile and WBC is elevated at 18.4. Lactic acid is elevated at 2.75 and Scr is elevated at 1.37.   Plan: - Vancomycin 1gm IV x 1 then 500mg  IV Q24H - Levaquin 750mg  IV x 1 then 500mg  IV Q48H - F/u renal fxn, C&S, clinical status and trough at SS     Temp (24hrs), Avg:98.4 F (36.9 C), Min:97.4 F (36.3 C), Max:99.4 F (37.4 C)   Recent Labs Lab 11/17/2015 2112 11/06/2015 2128  WBC 18.4*  --   CREATININE 1.37*  --   LATICACIDVEN  --  2.75*    CrCl cannot be calculated (Unknown ideal weight.).    Allergies  Allergen Reactions  . Amoxicillin-Pot Clavulanate     REACTION: rash, itching  . Cephalexin     REACTION: rash, itching  . Codeine     REACTION: rash, itching    Antimicrobials this admission: Vanc 2/17>> Levaquin 2/17>>  Dose adjustments this admission: N/A  Microbiology results: Pending  Thank you for allowing pharmacy to be a part of this patient's care.  Othmar Ringer, Rande Lawman 11/04/2015 10:30 PM

## 2015-11-14 ENCOUNTER — Encounter (HOSPITAL_COMMUNITY): Payer: Self-pay | Admitting: Family Medicine

## 2015-11-14 DIAGNOSIS — A419 Sepsis, unspecified organism: Secondary | ICD-10-CM | POA: Diagnosis present

## 2015-11-14 DIAGNOSIS — N183 Chronic kidney disease, stage 3 unspecified: Secondary | ICD-10-CM | POA: Diagnosis present

## 2015-11-14 DIAGNOSIS — I10 Essential (primary) hypertension: Secondary | ICD-10-CM

## 2015-11-14 DIAGNOSIS — R7303 Prediabetes: Secondary | ICD-10-CM

## 2015-11-14 LAB — CBC WITH DIFFERENTIAL/PLATELET
BASOS ABS: 0 10*3/uL (ref 0.0–0.1)
BASOS PCT: 0 %
Eosinophils Absolute: 0.1 10*3/uL (ref 0.0–0.7)
Eosinophils Relative: 1 %
HEMATOCRIT: 29.4 % — AB (ref 36.0–46.0)
HEMOGLOBIN: 9.6 g/dL — AB (ref 12.0–15.0)
LYMPHS PCT: 5 %
Lymphs Abs: 1.2 10*3/uL (ref 0.7–4.0)
MCH: 27.6 pg (ref 26.0–34.0)
MCHC: 32.7 g/dL (ref 30.0–36.0)
MCV: 84.5 fL (ref 78.0–100.0)
MONO ABS: 1.1 10*3/uL — AB (ref 0.1–1.0)
Monocytes Relative: 5 %
NEUTROS ABS: 21.7 10*3/uL — AB (ref 1.7–7.7)
NEUTROS PCT: 90 %
PLATELETS: 391 10*3/uL (ref 150–400)
RBC: 3.48 MIL/uL — AB (ref 3.87–5.11)
RDW: 14.9 % (ref 11.5–15.5)
WBC: 24.2 10*3/uL — ABNORMAL HIGH (ref 4.0–10.5)

## 2015-11-14 LAB — BASIC METABOLIC PANEL
ANION GAP: 12 (ref 5–15)
BUN: 45 mg/dL — ABNORMAL HIGH (ref 6–20)
CALCIUM: 9.2 mg/dL (ref 8.9–10.3)
CO2: 24 mmol/L (ref 22–32)
Chloride: 106 mmol/L (ref 101–111)
Creatinine, Ser: 1.04 mg/dL — ABNORMAL HIGH (ref 0.44–1.00)
GFR, EST AFRICAN AMERICAN: 55 mL/min — AB (ref >60)
GFR, EST NON AFRICAN AMERICAN: 48 mL/min — AB (ref >60)
GLUCOSE: 107 mg/dL — AB (ref 65–99)
Potassium: 4 mmol/L (ref 3.5–5.1)
Sodium: 142 mmol/L (ref 135–145)

## 2015-11-14 LAB — I-STAT TROPONIN, ED: Troponin i, poc: 0.01 ng/mL (ref 0.00–0.08)

## 2015-11-14 LAB — CBG MONITORING, ED
GLUCOSE-CAPILLARY: 79 mg/dL (ref 65–99)
GLUCOSE-CAPILLARY: 142 mg/dL — AB (ref 65–99)
Glucose-Capillary: 82 mg/dL (ref 65–99)
Glucose-Capillary: 97 mg/dL (ref 65–99)

## 2015-11-14 LAB — GLUCOSE, CAPILLARY: GLUCOSE-CAPILLARY: 94 mg/dL (ref 65–99)

## 2015-11-14 LAB — CRYPTOCOCCAL ANTIGEN: CRYPTO AG: NEGATIVE

## 2015-11-14 LAB — I-STAT CG4 LACTIC ACID, ED: Lactic Acid, Venous: 2.45 mmol/L (ref 0.5–2.0)

## 2015-11-14 MED ORDER — MEGESTROL ACETATE 40 MG/ML PO SUSP
200.0000 mg | Freq: Two times a day (BID) | ORAL | Status: DC
  Administered 2015-11-15 (×3): 200 mg via ORAL
  Filled 2015-11-14 (×8): qty 5

## 2015-11-14 MED ORDER — SODIUM CHLORIDE 0.9 % IV SOLN
INTRAVENOUS | Status: DC
  Administered 2015-11-14 – 2015-11-15 (×3): via INTRAVENOUS

## 2015-11-14 MED ORDER — ACETAMINOPHEN 325 MG PO TABS
650.0000 mg | ORAL_TABLET | Freq: Four times a day (QID) | ORAL | Status: DC | PRN
  Filled 2015-11-14: qty 2

## 2015-11-14 MED ORDER — SODIUM CHLORIDE 0.9 % IV SOLN: INTRAVENOUS | Status: AC

## 2015-11-14 MED ORDER — CETYLPYRIDINIUM CHLORIDE 0.05 % MT LIQD
7.0000 mL | Freq: Two times a day (BID) | OROMUCOSAL | Status: DC
  Administered 2015-11-15 – 2015-11-22 (×12): 7 mL via OROMUCOSAL

## 2015-11-14 MED ORDER — ALBUTEROL SULFATE (2.5 MG/3ML) 0.083% IN NEBU
2.5000 mg | INHALATION_SOLUTION | Freq: Four times a day (QID) | RESPIRATORY_TRACT | Status: DC
  Administered 2015-11-14 (×2): 2.5 mg via RESPIRATORY_TRACT
  Filled 2015-11-14 (×3): qty 3

## 2015-11-14 MED ORDER — INSULIN ASPART 100 UNIT/ML ~~LOC~~ SOLN
0.0000 [IU] | SUBCUTANEOUS | Status: DC
  Administered 2015-11-14 – 2015-11-16 (×3): 1 [IU] via SUBCUTANEOUS
  Filled 2015-11-14: qty 1

## 2015-11-14 MED ORDER — BUDESONIDE 0.5 MG/2ML IN SUSP
0.5000 mg | Freq: Two times a day (BID) | RESPIRATORY_TRACT | Status: DC
  Administered 2015-11-15 – 2015-11-21 (×13): 0.5 mg via RESPIRATORY_TRACT
  Filled 2015-11-14 (×15): qty 2

## 2015-11-14 MED ORDER — LORAZEPAM 2 MG/ML IJ SOLN: 0.5000 mg | INTRAMUSCULAR | Status: DC | PRN

## 2015-11-14 MED ORDER — ADULT MULTIVITAMIN LIQUID CH
Freq: Every day | ORAL | Status: DC
  Administered 2015-11-15: 5 mL via ORAL
  Filled 2015-11-14 (×5): qty 5

## 2015-11-14 MED ORDER — HEPARIN SODIUM (PORCINE) 5000 UNIT/ML IJ SOLN
5000.0000 [IU] | Freq: Three times a day (TID) | INTRAMUSCULAR | Status: DC
  Administered 2015-11-14 – 2015-11-22 (×24): 5000 [IU] via SUBCUTANEOUS
  Filled 2015-11-14 (×21): qty 1

## 2015-11-14 NOTE — ED Notes (Addendum)
Per Dr Verlon Au, wake pt and have her sit up for an hour or so at a time and leave lights on while up. Also advised no in and out cath for urine. Aware pt incont.

## 2015-11-14 NOTE — ED Notes (Signed)
Milk given as requested. Son assisting pt.

## 2015-11-14 NOTE — ED Notes (Signed)
Offered pt water d/t had advised was thirsty. Pt refused. Family attempted to assist. Family refused for pt to be assessed for incont. D/t does not want pt moved d/t uncomfortable for pt.

## 2015-11-14 NOTE — ED Notes (Signed)
Will administer neb tx when finishes eating.

## 2015-11-14 NOTE — ED Notes (Addendum)
Recliner given to pt's daughter for comfort and Coke x 2 given as requested. Offered to move pt on to hospital bed for comfort - family member declined - states would rather not move pt d/t too painful for her. Asked if pt can advise if she is soiled - advised no - she will advise she is dry when she is not. States would rather leave pt in current position of comfort.

## 2015-11-14 NOTE — H&P (Signed)
[facility] History and Physical  Ashley Norman [id number] DOB: [birth year] DOA: 11/19/2015  Referring physician: ED physician PCP: Noralee Space, MD  Specialists:  Dr. Lenna Gilford (pulmonology), Dr. Olevia Perches (GI)   Chief Complaint:  Lethargy, weakness   HPI: Ashley Norman is a 82 y.o. female with PMH of asthma, hypertension, dementia, anxiety, and chronic kidney disease stage III who presents to the ED with progressively worsening generalized weakness and lethargy. History is obtained from the patient's daughter at the bedside, review of patient's chart, and discussion with the ED personnel. Patient had apparently been in her usual state, able to feed herself and conversive, up until approximately 3 weeks ago when she started to become less active. Over the past several days, her condition has acutely worsened to the point that she will not get out of bed or speak. She has not had any PO intake over this interval. She has exhibited a new cough and has reportedly felt warm, but no definite fever. Her urine output seems to have dropped off. She has never been in this condition previously. The patient calls out in apparent pain when someone tries to move her, but otherwise has made no complaints.  In ED, patient was found to Be afebrile, saturating adequately on room air, but tachypnea can to the 30s and tachycardic in the low 100s. Blood pressure is on the lower side but stable. EKG demonstrated a sinus rhythm with left axis deviation and a nonspecific T-wave abnormality in the anterolateral leads. Chest x-ray is remarkable for a large soft tissue density with cavitation that takes at the upper half of the left hemithorax. Basic blood work returns notable for a leukocytosis to 18,400, thrombocytosis to 450,000, an elevated lactic acid to a value of 2.75. Radiologist raises concern for TB versus fungal infection versus cavitary bacterial pneumonia, or even malignancy. Patient was bolused with 1.5 L  normal saline, blood cultures were obtained, and empiric antibiotic therapy with Levaquin and vancomycin was initiated. Patient remained hemodynamically stable in the emergency department and will be admitted for ongoing evaluation and management of sepsis suspected secondary to CAP, though concern remains for fungal, tuberculotic, or even malignant etiology  Where does patient live?   At home     Can patient participate in ADLs?  Some   Review of Systems:  Unable to obtain ROS secondary to patient's clinical condition with non-verbal state.    Allergy:  Allergies  Allergen Reactions  . Amoxicillin-Pot Clavulanate     REACTION: rash, itching  . Cephalexin     REACTION: rash, itching  . Codeine     REACTION: rash, itching    Past Medical History  Diagnosis Date  . Asthma   . Hypertension   . Abnormal electrocardiogram   . Carotid bruit   . Diabetes mellitus   . Hypothyroidism   . Primary hyperparathyroidism ([place])   . GERD (gastroesophageal reflux disease)   . Hx of colonic polyps   . Renal insufficiency   . Headache(784.0)   . DJD (degenerative joint disease)   . Osteoporosis   . Anxiety     Past Surgical History  Procedure Laterality Date  . Tonsillectomy and adenoidectomy    . Appendectomy      Social History:  reports that she has never smoked. She has never used smokeless tobacco. She reports that she does not drink alcohol or use illicit drugs.  Family History:  Family History  Problem Relation Age of Onset  . Cerebral  aneurysm Mother     hemorrhage/deceased at 2  . Heart failure Father     deceased age 83  . Heart failure Sister   . Lung cancer Brother   . Leukemia Brother   . Colon cancer Brother   . Stomach cancer Brother      Prior to Admission medications   Medication Sig Start Date End Date Taking? Authorizing Provider  budesonide (PULMICORT) 0.5 MG/2ML nebulizer solution Take 2 mLs (0.5 mg total) by nebulization 2 (two) times daily. 12/12/14   Yes Noralee Space, MD  fluocinonide-emollient (LIDEX-E) 0.05 % cream Apply 1 application topically daily as needed. Apply to spots per daughter   Yes Historical Provider, MD  levalbuterol Penne Lash) 0.63 MG/3ML nebulizer solution USE 1 VIAL 4 TIMES A DAY 12/24/14  Yes Noralee Space, MD  megestrol (MEGACE) 40 MG/ML suspension TAKE 1 TEASPOON BY MOUTH THREE TIMES A DAY 07/20/15  Yes Noralee Space, MD  Multiple Vitamin (MULTIVITAMIN PO) Take 1 tablet by mouth daily.     Yes Historical Provider, MD  PROAIR HFA 108 (90 BASE) MCG/ACT inhaler INHALE 2 PUFFS EVERY 6 HOURS IF NEEDED FOR WHEEZING 10/03/14  Yes Noralee Space, MD  SPIRIVA HANDIHALER 18 MCG inhalation capsule inhale contents of 1 capsule by mouth once daily 08/03/13  Yes Noralee Space, MD  azithromycin [facility]) 200 MG/5ML suspension Take 10 ml by mouth on first day, then 5 ml by mouth daily x 4 days. Patient not taking: Reported on 11/12/2015 11/05/15   Deneise Lever, MD  fluconazole (DIFLUCAN) 100 MG tablet Take 2 pills by mouth now, and then 1 pill by mouth daily until gone. Patient not taking: Reported on 11/02/2015 03/03/15   Noralee Space, MD  fluocinonide-emollient (LIDEX-E) 0.05 % cream apply to affected area as directed Patient not taking: Reported on 11/03/2015 01/01/15   Noralee Space, MD  hydrOXYzine (ATARAX/VISTARIL) 10 MG tablet Take 1 tablet (10 mg total) by mouth 4 (four) times daily as needed for itching. Patient not taking: Reported on 11/16/2015 12/12/14   Noralee Space, MD  levothyroxine (SYNTHROID, LEVOTHROID) 75 MCG tablet take 1 tablet by mouth once daily Patient not taking: Reported on 11/12/2015 02/27/15   Noralee Space, MD  lisinopril (PRINIVIL,ZESTRIL) 20 MG tablet Take 10 mg by mouth daily.   05/13/11 03/31/14  Noralee Space, MD  lisinopril (PRINIVIL,ZESTRIL) 20 MG tablet take 1 tablet by mouth once daily Patient not taking: Reported on 10/31/2015    Noralee Space, MD  LORazepam (ATIVAN) 1 MG tablet Take 0.5-1 tablets (0.5-1 mg  total) by mouth 3 (three) times daily as needed for anxiety. Patient not taking: Reported on 11/09/2015 01/07/13   Noralee Space, MD  potassium chloride (K-DUR) 10 MEQ tablet Take 1 tablet (10 mEq total) by mouth daily. 06/11/13 06/11/14  Noralee Space, MD  predniSONE (DELTASONE) 10 MG tablet take 2 tablet by mouth once daily Patient not taking: Reported on 11/20/2015 02/27/15   Noralee Space, MD  PROAIR HFA 108 (90 BASE) MCG/ACT inhaler inhale 2 puffs by mouth every 6 hours if needed for wheezing Patient not taking: Reported on 11/04/2015 09/07/15   Noralee Space, MD  sertraline (ZOLOFT) 50 MG tablet take 1 tablet by mouth once daily Patient not taking: Reported on 11/09/2015 06/08/13   Noralee Space, MD  theophylline (THEODUR) 300 MG 12 hr tablet TAKE 1 TABLET BY MOUTH DAILY Patient not taking: Reported on 11/17/2015 04/27/15  Noralee Space, MD    Physical Exam: Filed Vitals:   11/14/15 0030 11/14/15 0100 11/14/15 0145 11/14/15 0200  BP: 104/64 104/62 108/64 117/60  Pulse: 81 80 82 74  Temp:      TempSrc:      Resp: 31 35 36 32  SpO2: 99% 99% 96% 97%   General: Not in acute distress. Cachectic HEENT:       Eyes: PERRL, EOMI, no scleral icterus or conjunctival pallor.       ENT: No discharge from the ears or nose, oral mucosa dry, tacky.        Neck: No JVD, no bruit, no appreciable mass Heme: No cervical adenopathy, no pallor Cardiac: Rate ~100 and regular with grade III crescendo-decrescendo systolic murmur at LSB, No gallops or rubs. Pulm:  Left lung fields with crackles, friction rub. No pallor or cyanosis.  Abd: Soft, nondistended, nontender, no rebound pain or gaurding, no mass or organomegaly, BS present. Ext: No LE edema bilaterally. 2+DP/PT pulse bilaterally. Musculoskeletal: No gross deformity, no red, hot, swollen joints   Skin: No rashes or wounds on exposed surfaces  Neuro: Obtunded. Withdraws from pain. Brachial reflex 2+ bilaterally. Knee reflex 2+ bilaterally. Negative  Babinski's sign. No focal findings Psych: Unable to assess given clinical condition with obtundation.  Labs on Admission:  Basic Metabolic Panel:  Recent Labs Lab 11/07/2015 2112  NA 142  K 4.5  CL 98*  CO2 30  GLUCOSE 184*  BUN 52*  CREATININE 1.37*  CALCIUM 10.7*   Liver Function Tests:  Recent Labs Lab 10/31/2015 2112  AST 24  ALT 21  ALKPHOS 77  BILITOT 0.7  PROT 7.4  ALBUMIN 2.2*   No results for input(s): LIPASE, AMYLASE in the last 168 hours. No results for input(s): AMMONIA in the last 168 hours. CBC:  Recent Labs Lab 11/04/2015 2112  WBC 18.4*  NEUTROABS 15.4*  HGB 13.8  HCT 41.0  MCV 83.8  PLT 452*   Cardiac Enzymes: No results for input(s): CKTOTAL, CKMB, CKMBINDEX, TROPONINI in the last 168 hours.  BNP (last 3 results) No results for input(s): BNP in the last 8760 hours.  ProBNP (last 3 results) No results for input(s): PROBNP in the last 8760 hours.  CBG: No results for input(s): GLUCAP in the last 168 hours.  Radiological Exams on Admission: Dg Chest Portable 1 View  11/17/2015  CLINICAL DATA:  Generalized weakness and lethargy for 1 week. EXAM: PORTABLE CHEST 1 VIEW COMPARISON:  01/04/2013. FINDINGS: Interval large area of increased density and cavitation with irregular walls in the upper half of the left hemithorax. The right lung remains clear and mildly hyperexpanded. The cardiac silhouette remains borderline enlarged. Diffuse osteopenia. IMPRESSION: 1. Interval large area of soft tissue density and cavitation in the upper half of the left hemithorax. Differential considerations include tuberculosis infection, fungal infection, cavitary pneumonia and cavitary malignancy. 2. Stable mild changes of COPD. Electronically Signed   By: Claudie Revering M.D.   On: 11/12/2015 21:13    EKG: Independently reviewed.  Abnormal findings: Sinus, LAD, early R-wave transition, non-specific Tw abnormality in anterolateral leads   Assessment/Plan  1. Sepsis  secondary to CAP, organism not yet determined  - Cavitary pneumonia suspected, but Ddx also includes TB, fungus, and malignancy  - Treating empirically with Levaquin and vancomycin given beta-lactam allergy  - Check quantiferon, airborne precautions while pending  - Check aspergillus and cryptococcus antigens  - Check urine for antigens to strep pneumo or legionella  -  Trend lactate, procalcitonin  - IVF resuscitation   - May require BAL or biopsy for diagnosis, but family not wanting anything aggressive   2. Adult failure to thrive - Daughter describes a gradual decline over 3-4 yrs with dementia, and much more rapid over past 3 wks - Suspect the acute worsening can be attributed to the pulmonary process - Monitor for improvement with treatment of #1  - High-calorie supplements, Megace   3. CKD stage III  - SCr 1.37 on admission, up from apparent baseline of ~1.1  - Bump likely secondary to poor PO intake and pulmonary process  - Hydrating with IVF, avoiding nephrotoxins as feasible   - Repeat chem panel in am   4. Hypertension  - At goal currently  - Hold lisinopril given SCr bump and concern for worsening sepsis  - Will treat with IV agents prn    5. Insulin resistance  - No formal diabetes diagnosis, but borderline A1c  - Serum glucose 184 on presentation  - Will check CBGs and institute a SSI regimen prn  - Update A1c, pending      DVT ppx: SQ Heparin     Code Status: Full code Family Communication:   Yes, patient's daughter at bed side Disposition Plan: Admit to inpatient   Date of Service 11/14/2015    Vianne Bulls, MD [facility] Pager [phone]  If 7PM-7AM, please contact night-coverage [email or web address] Password [email or web address] 11/14/2015, 2:17 AM

## 2015-11-14 NOTE — Progress Notes (Signed)
Ashley Norman [id number] DOB: 1928/01/14 DOA: 11/17/2015 PCP: Noralee Space, MD  Brief narrative: 82 year old female Known history of asthma-poorly compliant with oral meds and MDIs Hypertension Carotid bruit with mild plaque in the past 01/2011 Diabetes mellitus Hypothyroid Primary hyperparathyroidism Discharge just disease with osteoporosis Senile dementia Excoriated rashes and adult failure to thrive  Admitted to [facility] 11/14/15 with general decline over a three-week period of time where she is less interactive and able to speak and had a new cough On admission she was tachycardic and found to have a large chest soft tissue density with cavitation taking up left hemithorax .White count was 18 thrombocytosis noted 4500 and lactic acid 2.7 Patient IV fluid bolus and   Past medical history-As per Problem list Chart reviewed as below-   Consultants:  none  Procedures:    Antibiotics:  Levaquin 750 2/18  Vancomycin 2/18   Subjective  More awake alert Poor ROS and actually more verbal now.    Objective      Objective: Filed Vitals:   11/14/15 0145 11/14/15 0200 11/14/15 0400 11/14/15 0600  BP: 108/64 117/60 100/61 103/55  Pulse: 82 74 76 79  Temp:      TempSrc:      Resp: 36 32 32 33  SpO2: 96% 97% 94% 92%    Intake/Output Summary (Last 24 hours) at 11/14/15 1346 Last data filed at 11/16/2015 2240  Gross per 24 hour  Intake    200 ml  Output      0 ml  Net    200 ml     Data Reviewed: Basic Metabolic Panel:  Recent Labs Lab 10/29/2015 2112 11/14/15 0259  NA 142 142  K 4.5 4.0  CL 98* 106  CO2 30 24  GLUCOSE 184* 107*  BUN 52* 45*  CREATININE 1.37* 1.04*  CALCIUM 10.7* 9.2   Liver Function Tests:  Recent Labs Lab 11/21/2015 2112  AST 24  ALT 21  ALKPHOS 77  BILITOT 0.7  PROT 7.4  ALBUMIN 2.2*   No results for input(s): LIPASE, AMYLASE in the last 168 hours. No results for input(s): AMMONIA in the last 168  hours. CBC:  Recent Labs Lab 11/08/2015 2112 11/14/15 0259  WBC 18.4* 24.2*  NEUTROABS 15.4* 21.7*  HGB 13.8 9.6*  HCT 41.0 29.4*  MCV 83.8 84.5  PLT 452* 391   Cardiac Enzymes: No results for input(s): CKTOTAL, CKMB, CKMBINDEX, TROPONINI in the last 168 hours. BNP: Invalid input(s): POCBNP CBG:  Recent Labs Lab 11/14/15 0856 11/14/15 1300  GLUCAP 82 79    No results found for this or any previous visit (from the past 240 hour(s)).   Studies:              All Imaging reviewed and is as per above notation   Scheduled Meds: . albuterol  2.5 mg Nebulization Q6H  . budesonide  0.5 mg Nebulization BID  . heparin  5,000 Units Subcutaneous 3 times per day  . insulin aspart  0-9 Units Subcutaneous 6 times per day  . megestrol  200 mg Oral BID  . multivitamin   Oral Daily   Continuous Infusions: . sodium chloride Stopped (11/14/15 0229)  . [START ON 11/15/2015] levofloxacin (LEVAQUIN) IV    . vancomycin       Assessment/Plan:    1. POC as above per DR. Opyd Rx as Necrotizing PNA.  GIve fluids abx, keep Airborne isolation-r/o TB Long discussion c family re: Goals of care  and family wonders about overall diagnosis ? Cancer.  Discussed might not have meaningful recovery and nxt 24 hours will guids regarding plans going foraward. Patient to be given Ativan 0.5 q4prn agitation     Verneita Griffes, MD  [facility] Pager [phone] 11/14/2015, 1:46 PM    LOS: 1 day

## 2015-11-14 NOTE — ED Notes (Signed)
Admitting MD in w/pt and family.

## 2015-11-14 NOTE — ED Notes (Addendum)
Dr Verlon Au ordering specialized mattress for pt. Requests text page when bed assigned. RN, EMT and pt's son assisted w/placing pillow under pt's legs for comfort d/t pt refuses to lie on right side - will only lie on left. Pt's brief remains dry at this time.

## 2015-11-14 NOTE — ED Notes (Addendum)
IV to right AC noted to have infiltrated - swelling noted - warm pack applied to arm. Son aware. Moved pt to [place] bed.

## 2015-11-15 ENCOUNTER — Inpatient Hospital Stay (HOSPITAL_COMMUNITY): Payer: Medicare Other

## 2015-11-15 DIAGNOSIS — J4540 Moderate persistent asthma, uncomplicated: Secondary | ICD-10-CM

## 2015-11-15 DIAGNOSIS — L8990 Pressure ulcer of unspecified site, unspecified stage: Secondary | ICD-10-CM | POA: Insufficient documentation

## 2015-11-15 LAB — GLUCOSE, CAPILLARY
GLUCOSE-CAPILLARY: 76 mg/dL (ref 65–99)
GLUCOSE-CAPILLARY: 137 mg/dL — AB (ref 65–99)
GLUCOSE-CAPILLARY: 118 mg/dL — AB (ref 65–99)
Glucose-Capillary: 72 mg/dL (ref 65–99)
Glucose-Capillary: 82 mg/dL (ref 65–99)
Glucose-Capillary: 94 mg/dL (ref 65–99)

## 2015-11-15 LAB — BASIC METABOLIC PANEL
ANION GAP: 11 (ref 5–15)
BUN: 36 mg/dL — AB (ref 6–20)
CHLORIDE: 104 mmol/L (ref 101–111)
CO2: 23 mmol/L (ref 22–32)
Calcium: 8.8 mg/dL — ABNORMAL LOW (ref 8.9–10.3)
Creatinine, Ser: 1.06 mg/dL — ABNORMAL HIGH (ref 0.44–1.00)
GFR calc Af Amer: 54 mL/min — ABNORMAL LOW (ref >60)
GFR, EST NON AFRICAN AMERICAN: 47 mL/min — AB (ref >60)
GLUCOSE: 81 mg/dL (ref 65–99)
POTASSIUM: 3.7 mmol/L (ref 3.5–5.1)
Sodium: 138 mmol/L (ref 135–145)

## 2015-11-15 LAB — CBC WITH DIFFERENTIAL/PLATELET
BASOS ABS: 0 10*3/uL (ref 0.0–0.1)
Basophils Relative: 0 %
Eosinophils Absolute: 0.1 10*3/uL (ref 0.0–0.7)
Eosinophils Relative: 0 %
HEMATOCRIT: 33.3 % — AB (ref 36.0–46.0)
HEMOGLOBIN: 10.9 g/dL — AB (ref 12.0–15.0)
LYMPHS ABS: 1.3 10*3/uL (ref 0.7–4.0)
LYMPHS PCT: 8 %
MCH: 27.7 pg (ref 26.0–34.0)
MCHC: 32.7 g/dL (ref 30.0–36.0)
MCV: 84.5 fL (ref 78.0–100.0)
Monocytes Absolute: 0.7 10*3/uL (ref 0.1–1.0)
Monocytes Relative: 4 %
NEUTROS ABS: 15.3 10*3/uL — AB (ref 1.7–7.7)
NEUTROS PCT: 88 %
PLATELETS: 353 10*3/uL (ref 150–400)
RBC: 3.94 MIL/uL (ref 3.87–5.11)
RDW: 14.9 % (ref 11.5–15.5)
WBC: 17.4 10*3/uL — AB (ref 4.0–10.5)

## 2015-11-15 LAB — HIV ANTIBODY (ROUTINE TESTING W REFLEX): HIV SCREEN 4TH GENERATION: NONREACTIVE

## 2015-11-15 MED ORDER — LEVALBUTEROL HCL 0.63 MG/3ML IN NEBU
0.6300 mg | INHALATION_SOLUTION | Freq: Four times a day (QID) | RESPIRATORY_TRACT | Status: DC
Start: 2015-11-15 — End: 2015-11-19
  Administered 2015-11-15 – 2015-11-19 (×14): 0.63 mg via RESPIRATORY_TRACT
  Filled 2015-11-15 (×14): qty 3

## 2015-11-15 MED ORDER — LEVALBUTEROL HCL 0.63 MG/3ML IN NEBU
0.6300 mg | INHALATION_SOLUTION | Freq: Four times a day (QID) | RESPIRATORY_TRACT | Status: DC | PRN
  Administered 2015-11-19: 0.63 mg via RESPIRATORY_TRACT
  Filled 2015-11-15 (×2): qty 3

## 2015-11-15 MED ORDER — TIOTROPIUM BROMIDE MONOHYDRATE 18 MCG IN CAPS
18.0000 ug | ORAL_CAPSULE | Freq: Every day | RESPIRATORY_TRACT | Status: DC
Start: 2015-11-15 — End: 2015-11-22
  Administered 2015-11-16 – 2015-11-19 (×3): 18 ug via RESPIRATORY_TRACT
  Filled 2015-11-15: qty 5

## 2015-11-15 MED ORDER — DEXTROSE 5 % IV SOLN
450.0000 mg | INTRAVENOUS | Status: AC
  Administered 2015-11-15 – 2015-11-21 (×7): 450 mg via INTRAVENOUS
  Filled 2015-11-15 (×7): qty 9

## 2015-11-15 NOTE — Progress Notes (Signed)
ANTIBIOTIC CONSULT NOTE - INITIAL  Pharmacy Consult for Acyclovir/Levofloxacin/Vancomycin Indication: pneumonia and shingles  Allergies  Allergen Reactions  . Amoxicillin-Pot Clavulanate     REACTION: rash, itching  . Cephalexin     REACTION: rash, itching  . Codeine     REACTION: rash, itching    Patient Measurements: IBW= 61 kg  Vital Signs: Temp: 98.3 F (36.8 C) (02/19 0830) Temp Source: Axillary (02/19 0830) BP: 131/56 mmHg (02/19 0830) Pulse Rate: 72 (02/19 0830) Intake/Output from previous day: 02/18 0701 - 02/19 0700 In: 1626.3 [P.O.:520; I.V.:1006.3; IV Piggyback:100] Out: 1 [Stool:1] Intake/Output from this shift:    Labs:  Recent Labs  11/11/2015 2112 11/14/15 0259 11/15/15 0859  WBC 18.4* 24.2* 17.4*  HGB 13.8 9.6* 10.9*  PLT 452* 391 353  CREATININE 1.37* 1.04* 1.06*   CrCl cannot be calculated (Unknown ideal weight.). No results for input(s): VANCOTROUGH, VANCOPEAK, VANCORANDOM, GENTTROUGH, GENTPEAK, GENTRANDOM, TOBRATROUGH, TOBRAPEAK, TOBRARND, AMIKACINPEAK, AMIKACINTROU, AMIKACIN in the last 72 hours.   Microbiology: Recent Results (from the past 720 hour(s))  Blood Culture (routine x 2)     Status: None (Preliminary result)   Collection Time: 11/08/2015  9:11 PM  Result Value Ref Range Status   Specimen Description BLOOD LEFT ARM  Final   Special Requests BOTTLES DRAWN AEROBIC AND ANAEROBIC 5ML  Final   Culture NO GROWTH < 24 HOURS  Final   Report Status PENDING  Incomplete  Blood Culture (routine x 2)     Status: None (Preliminary result)   Collection Time: 11/15/2015  9:23 PM  Result Value Ref Range Status   Specimen Description BLOOD RIGHT FOREARM  Final   Special Requests IN PEDIATRIC BOTTLE 1ML  Final   Culture NO GROWTH < 24 HOURS  Final   Report Status PENDING  Incomplete    Medical History: Past Medical History  Diagnosis Date  . Asthma   . Hypertension   . Abnormal electrocardiogram   . Carotid bruit   . Diabetes mellitus   .  Hypothyroidism   . Primary hyperparathyroidism ([place])   . GERD (gastroesophageal reflux disease)   . Hx of colonic polyps   . Renal insufficiency   . Headache(784.0)   . DJD (degenerative joint disease)   . Osteoporosis   . Anxiety     Medications:  Scheduled:  . antiseptic oral rinse  7 mL Mouth Rinse BID  . budesonide  0.5 mg Nebulization BID  . heparin  5,000 Units Subcutaneous 3 times per day  . insulin aspart  0-9 Units Subcutaneous 6 times per day  . levalbuterol  0.63 mg Nebulization QID  . levofloxacin (LEVAQUIN) IV  500 mg Intravenous Q48H  . megestrol  200 mg Oral BID  . multivitamin   Oral Daily  . tiotropium  18 mcg Inhalation Daily  . vancomycin  500 mg Intravenous Q24H   Infusions:  . sodium chloride 75 mL/hr at 11/15/15 0342   PRN: acetaminophen, levalbuterol, LORazepam Assessment: 54 YOF with PMH of asthma, DM, hypothyroid, HTN.  Pt has had a general decline over a three week period. Pharmacy is consulted to dose acyclovir for shingles and vanc/levaquin for rule out pneumonia.   LA 2.75>2.45, now Afebrile, WBC 17.4 SCr 1.04>1.06, CrCl ~28 ml/min   Vanc 2/17 >> Levaquin 2/17 >> Acyclovir 2/19 >>  Blood cx 2/17 > NGTD  Goal of Therapy:  Vancomycin trough level 15-20 mcg/ml  Plan:  Start Acyclovir 10 mg/kg (450 mg) IV q24h x 7 days  Continue Levofloxacin  500 mg IV Q48h Continue Vancomycin 500 mg IV Q24h Measure antibiotic drug levels at steady state Follow up culture results, monitor renal fxn  Vanc trough on 2/20 PM prior dose (2130)    Bennye Alm, PharmD Pharmacy Resident [phone] 11/15/2015,10:39 AM

## 2015-11-15 NOTE — Progress Notes (Signed)
Patient arrived on unit via overlay mattress bed with ED NT. Patient disoriented x4. Patient oriented to room and staff. Patient's son and daughter at bedside. Patient placed on telemetry monitor, CCMD notified. Patient's IV clean, dry and infusing. Patient denies pain. Patient on 2 L of oxygen. Patient placed on continuous pulse ox.   Skin:  Patient's skin assessment completed with two RN's. Noted were multiple pressure points, abrasions, and scratches. Left trochanter-unstageable : 6.5 cm x 5.5 Black with redness surrounding. Left lateral hip: 5.5cm x 3.5cm Red with non blanchable center. Coccyx-unstageable : 8cm x6cm, blackened area  3cmx1cm Right inner knee-unstageable :2.5cmx1.5 cm 30%red Left outer foot-unstageable : 2cmx1cm 30%black, 70% red Left outer pinkie-unstageable 2cmx.5cm Right inner heel-red nonblanchable 2.5cmx2.5cm Left inner shin, red DTI 3cmx2.5cm Left inner foot unstageable 2cmx1.5cm Right trochanter, red 4.5cmx2.5cm Between 4th and pinky toe on left foot-unstageable Right upper outer thigh-Red raised rash, scabbed vesicles   MD notified via text page.   Orders have been reviewed and implemented.  Call light has been placed within reach and bed alarm has been activated. RN will continue to monitor the patient.  Nena Polio BSN, RN  Phone Number: [phone]

## 2015-11-15 NOTE — Progress Notes (Signed)
Ashley Norman [id number] DOB: 12/24/1927 DOA: 10/28/2015 PCP: Noralee Space, MD  Brief narrative:  82 year old female Known history of asthma-poorly compliant with oral meds and MDIs Hypertension Carotid bruit with mild plaque in the past 01/2011 Diabetes mellitus Hypothyroid Primary hyperparathyroidism Discharge just disease with osteoporosis Senile dementia Excoriated rashes and adult failure to thrive  Admitted to [facility] 11/14/15 with general decline over a three-week period of time where she is less interactive and able to speak and had a new cough On admission she was tachycardic and found to have a large chest soft tissue density with cavitation taking up left hemithorax .White count was 18 thrombocytosis noted 4500 and lactic acid 2.7 Patient IV fluid bolused alos found to have a rash opn the L upper thigh c/w shingles   Past medical history-As per Problem list Chart reviewed as below-   Consultants:  none  Procedures:    Antibiotics:  Levaquin 750 2/18  Vancomycin 2/18   Subjective   "gimme a coke" I'm thirsty" Rest of ROS is diminshed.  Cannot eeally tell me where she is. No n/v/cp   Objective      Objective: Filed Vitals:   11/14/15 2123 11/15/15 0443 11/15/15 0823 11/15/15 0830  BP: 102/58 94/56  131/56  Pulse: 86 81  72  Temp: 98.6 F (37 C) 98.5 F (36.9 C)  98.3 F (36.8 C)  TempSrc: Oral Oral  Axillary  Resp: 20 19  20   SpO2: 98% 98% 100% 100%    Intake/Output Summary (Last 24 hours) at 11/15/15 1011 Last data filed at 11/15/15 0600  Gross per 24 hour  Intake 1626.25 ml  Output      1 ml  Net 1625.25 ml     Data Reviewed: Basic Metabolic Panel:  Recent Labs Lab 11/10/2015 2112 11/14/15 0259 11/15/15 0859  NA 142 142 138  K 4.5 4.0 3.7  CL 98* 106 104  CO2 30 24 23   GLUCOSE 184* 107* 81  BUN 52* 45* 36*  CREATININE 1.37* 1.04* 1.06*  CALCIUM 10.7* 9.2 8.8*   Liver Function Tests:  Recent  Labs Lab 11/05/2015 2112  AST 24  ALT 21  ALKPHOS 77  BILITOT 0.7  PROT 7.4  ALBUMIN 2.2*   No results for input(s): LIPASE, AMYLASE in the last 168 hours. No results for input(s): AMMONIA in the last 168 hours. CBC:  Recent Labs Lab 11/12/2015 2112 11/14/15 0259 11/15/15 0859  WBC 18.4* 24.2* 17.4*  NEUTROABS 15.4* 21.7* 15.3*  HGB 13.8 9.6* 10.9*  HCT 41.0 29.4* 33.3*  MCV 83.8 84.5 84.5  PLT 452* 391 353   Cardiac Enzymes: No results for input(s): CKTOTAL, CKMB, CKMBINDEX, TROPONINI in the last 168 hours. BNP: Invalid input(s): POCBNP CBG:  Recent Labs Lab 11/14/15 2014 11/14/15 2121 11/15/15 0039 11/15/15 0436 11/15/15 0731  GLUCAP 97 94 82 76 94    Recent Results (from the past 240 hour(s))  Blood Culture (routine x 2)     Status: None (Preliminary result)   Collection Time: 10/31/2015  9:11 PM  Result Value Ref Range Status   Specimen Description BLOOD LEFT ARM  Final   Special Requests BOTTLES DRAWN AEROBIC AND ANAEROBIC 5ML  Final   Culture NO GROWTH < 24 HOURS  Final   Report Status PENDING  Incomplete  Blood Culture (routine x 2)     Status: None (Preliminary result)   Collection Time: 11/24/2015  9:23 PM  Result Value Ref Range Status  Specimen Description BLOOD RIGHT FOREARM  Final   Special Requests IN PEDIATRIC BOTTLE 1ML  Final   Culture NO GROWTH < 24 HOURS  Final   Report Status PENDING  Incomplete    Studies:                All Imaging reviewed and is as per above notation    Scheduled Meds: . antiseptic oral rinse  7 mL Mouth Rinse BID  . budesonide  0.5 mg Nebulization BID  . heparin  5,000 Units Subcutaneous 3 times per day  . insulin aspart  0-9 Units Subcutaneous 6 times per day  . levalbuterol  0.63 mg Nebulization QID  . levofloxacin (LEVAQUIN) IV  500 mg Intravenous Q48H  . megestrol  200 mg Oral BID  . multivitamin   Oral Daily  . vancomycin  500 mg Intravenous Q24H   Continuous Infusions: . sodium chloride 75 mL/hr at  11/15/15 0342     Assessment/Plan:    1.  Sepsis 2/2 Necrotizing PNA.  GIve fluids abx, keep Airborne isolation-r/o TB.  Continue Levaquin IV, Vancomycin per pharmacy.  CBC + diff in am. Cont IVF.  Pending are asperigullus, crypotcoccus, Quantiferon.  Urine culture deferred given obvious source in lung.   Blood cult ng so far.  WBc trendign 18--24>>17.  RPt CXr to determine if just malrotation of film. 2. Shingles vs bullous pemphigoid ?-start Acyclovir per pharmacy. Duration Rx ~7-10 days-cont cream to the area 3. Severe asthma with poor compliance-cont budesonide 0.5 bid, continue Xopenex 0.3 qid neb-restart spiriva 18 and  Theophylline if prn but narrow therapeutic index drug 4. Hypertension-hold lisinopril 20 daily as admission bun/creat 52/1.37 5. Carotid bruit with mild plaque in the past 01/2011-continue ASA 6. H/o Diabetes mellitus-currently on no coverage-monitor am CBG on bmets.  Start diet 7. Hypothyroid-continue synthroid 75 daily 8. Primary hyperparathyroidism-s/p surgery 9. Senile dementia-severe.  Long discussion with family 2/18 re Goals of care.  Will continue to monitor for improvement    Called son-no answer Called daughter -no answer either Craig,Donna Daughter [phone]  Keep on tele    Verneita Griffes, MD  [facility] Pager [phone] 11/15/2015, 10:11 AM    LOS: 2 days

## 2015-11-16 ENCOUNTER — Inpatient Hospital Stay (HOSPITAL_COMMUNITY): Payer: Medicare Other

## 2015-11-16 LAB — GLUCOSE, CAPILLARY
GLUCOSE-CAPILLARY: 142 mg/dL — AB (ref 65–99)
GLUCOSE-CAPILLARY: 87 mg/dL (ref 65–99)
Glucose-Capillary: 95 mg/dL (ref 65–99)
Glucose-Capillary: 119 mg/dL — ABNORMAL HIGH (ref 65–99)
Glucose-Capillary: 90 mg/dL (ref 65–99)
Glucose-Capillary: 79 mg/dL (ref 65–99)

## 2015-11-16 LAB — HEMOGLOBIN A1C
Hgb A1c MFr Bld: 6.2 % — ABNORMAL HIGH (ref 4.8–5.6)
MEAN PLASMA GLUCOSE: 131 mg/dL

## 2015-11-16 MED ORDER — ENSURE ENLIVE PO LIQD
237.0000 mL | Freq: Three times a day (TID) | ORAL | Status: DC
  Administered 2015-11-18 – 2015-11-21 (×6): 237 mL via ORAL

## 2015-11-16 MED ORDER — CLINDAMYCIN HCL 300 MG PO CAPS
300.0000 mg | ORAL_CAPSULE | Freq: Three times a day (TID) | ORAL | Status: DC
  Administered 2015-11-16 – 2015-11-18 (×4): 300 mg via ORAL
  Filled 2015-11-16 (×6): qty 1

## 2015-11-16 MED ORDER — FUROSEMIDE 10 MG/ML IJ SOLN
60.0000 mg | Freq: Once | INTRAMUSCULAR | Status: AC
  Administered 2015-11-16: 60 mg via INTRAVENOUS
  Filled 2015-11-16: qty 6

## 2015-11-16 NOTE — Progress Notes (Signed)
Ashley Norman [id number] DOB: [birth year] DOA: 11/02/2015 PCP: Noralee Space, MD  Brief narrative:  82 year old female Known history of asthma-poorly compliant with oral meds and MDIs Hypertension Carotid bruit with mild plaque in the past 01/2011 Diabetes mellitus Hypothyroid Primary hyperparathyroidism Discharge just disease with osteoporosis Senile dementia Excoriated rashes and adult failure to thrive  Admitted to [facility] 11/14/15 with general decline over a three-week period of time where she is less interactive and able to speak and had a new cough On admission she was tachycardic and found to have a large chest soft tissue density with cavitation taking up left hemithorax .White count was 18 thrombocytosis noted 4500 and lactic acid 2.7 Patient IV fluid bolused alos found to have a rash opn the L upper thigh c/w shingles   Past medical history-As per Problem list Chart reviewed as below-   Consultants:  none  Procedures:    Antibiotics:  Levaquin 750 2/18  Vancomycin 2/18   Subjective   Intermittently confused but much better off than on admission and on 2/19 Tolerating diet only moderately Family reports close to baseline No nausea no vomiting    Objective      Objective: Filed Vitals:   11/16/15 0618 11/16/15 0911 11/16/15 1000 11/16/15 1218  BP: 105/38     Pulse: 75     Temp: 98.6 F (37 C)     TempSrc: Oral     Resp: 20     Height:   5' 6.93" (1.7 m)   Weight:      SpO2: 96% 95%  96%    Intake/Output Summary (Last 24 hours) at 11/16/15 1341 Last data filed at 11/16/15 1034  Gross per 24 hour  Intake    120 ml  Output      0 ml  Net    120 ml     Data Reviewed: Basic Metabolic Panel:  Recent Labs Lab 11/16/2015 2112 11/14/15 0259 11/15/15 0859  NA 142 142 138  K 4.5 4.0 3.7  CL 98* 106 104  CO2 30 24 23   GLUCOSE 184* 107* 81  BUN 52* 45* 36*  CREATININE 1.37* 1.04* 1.06*  CALCIUM 10.7* 9.2 8.8*    Liver Function Tests:  Recent Labs Lab 11/02/2015 2112  AST 24  ALT 21  ALKPHOS 77  BILITOT 0.7  PROT 7.4  ALBUMIN 2.2*   No results for input(s): LIPASE, AMYLASE in the last 168 hours. No results for input(s): AMMONIA in the last 168 hours. CBC:  Recent Labs Lab 11/19/2015 2112 11/14/15 0259 11/15/15 0859  WBC 18.4* 24.2* 17.4*  NEUTROABS 15.4* 21.7* 15.3*  HGB 13.8 9.6* 10.9*  HCT 41.0 29.4* 33.3*  MCV 83.8 84.5 84.5  PLT 452* 391 353   Cardiac Enzymes: No results for input(s): CKTOTAL, CKMB, CKMBINDEX, TROPONINI in the last 168 hours. BNP: Invalid input(s): POCBNP CBG:  Recent Labs Lab 11/15/15 2119 11/15/15 2352 11/16/15 0553 11/16/15 0734 11/16/15 1124  GLUCAP 118* 142* 87 95 119*    Recent Results (from the past 240 hour(s))  Blood Culture (routine x 2)     Status: None (Preliminary result)   Collection Time: 10/28/2015  9:11 PM  Result Value Ref Range Status   Specimen Description BLOOD LEFT ARM  Final   Special Requests BOTTLES DRAWN AEROBIC AND ANAEROBIC 5ML  Final   Culture NO GROWTH 3 DAYS  Final   Report Status PENDING  Incomplete  Blood Culture (routine x 2)  Status: None (Preliminary result)   Collection Time: 11/06/2015  9:23 PM  Result Value Ref Range Status   Specimen Description BLOOD RIGHT FOREARM  Final   Special Requests IN PEDIATRIC BOTTLE 1ML  Final   Culture NO GROWTH 3 DAYS  Final   Report Status PENDING  Incomplete    Studies:                All Imaging reviewed and is as per above notation    Scheduled Meds: . acyclovir  450 mg Intravenous Q24H  . antiseptic oral rinse  7 mL Mouth Rinse BID  . budesonide  0.5 mg Nebulization BID  . heparin  5,000 Units Subcutaneous 3 times per day  . insulin aspart  0-9 Units Subcutaneous 6 times per day  . levalbuterol  0.63 mg Nebulization QID  . levofloxacin (LEVAQUIN) IV  500 mg Intravenous Q48H  . multivitamin   Oral Daily  . tiotropium  18 mcg Inhalation Daily  . vancomycin   500 mg Intravenous Q24H   Continuous Infusions:     Assessment/Plan:    1.  Sepsis 2/2 Necrotizing PNA.  GIve fluids abx, keep Airborne isolation-r/o TB.  Transition IV Levaquin/vancomycin--> clindamycin. 300 every 8.  Will need at least 14-21 days given abscess  CBC + diff in am. Cont IVF.  Although unlikely labs that are Pending are asperigullus, crypotcoccus [neg], Quantiferon.    Blood cult ng so far.  WBc trendign 18--24>>17.  Rpt CXR 2/19 confirms cavitary lung lesion-repeat CBC a.m. 2. Shingles vs bullous pemphigoid ?-start Acyclovir per pharmacy. Duration Rx ~7-10 days-cont cream to the area 3. Severe asthma with poor compliance-cont budesonide 0.5 bid, continue Xopenex 0.3 qid neb-restart spiriva 18. Theophylline and has been held  4. Hypertension-hold lisinopril 20 daily as admission bun/creat 52/1.37-->36/1.06 on 11/16/15 5. Carotid bruit with mild plaque in the past 01/2011-continue ASA 6. H/o Diabetes mellitus-currently on no coverage-monitor am CBG on bmets.   7. Hypothyroid-continue synthroid 75 daily 8. Primary hyperparathyroidism-s/p surgery 9. Senile dementia-severe.  Long discussion with family 2/18 re Goals of care.  Expect patient will make some recovery.  Long discussion with family regarding whether this might be lung cancer?  Would not do bronch in someone with severe dementia and high risk procedure, as unlikely to change management   Discussed with one daughter on 11/15/15 DO NOT RESUSCITATE Possible skilled placement? Therapy to evaluate   Verneita Griffes, MD  [facility] Pager [phone] 11/16/2015, 1:41 PM    LOS: 3 days

## 2015-11-16 NOTE — Progress Notes (Signed)
Initial Nutrition Assessment  DOCUMENTATION CODES:   Severe malnutrition in context of chronic illness, Underweight  INTERVENTION:  Provide Ensure Enlive po TID, each supplement provides 350 kcal and 20 grams of protein.  Encourage adequate PO intake.   NUTRITION DIAGNOSIS:   Malnutrition related to chronic illness as evidenced by severe depletion of body fat, severe depletion of muscle mass.  GOAL:   Patient will meet greater than or equal to 90% of their needs  MONITOR:   PO intake, Supplement acceptance, Weight trends, Labs, I & O's, Diet advancement, Skin  REASON FOR ASSESSMENT:   Malnutrition Screening Tool    ASSESSMENT:   82 y.o. female with PMH of asthma, hypertension, dementia, anxiety, and chronic kidney disease stage III who presents to the ED with progressively worsening generalized weakness and lethargy. Pt with sepsis secondary to necrotizing PNA and found to have a rash opn the L upper thigh c/w shingles  Meal completion has been </= 50%. During time of visit, pt had only taken a couple of sips from meal tray. Family at bedside report pt has been experiencing poor po intake, especially over the past couple of days, with pt refusing to eat. Usual body weight reported to be ~103 lbs. Family reports they have to trying to get the patient to eat with offerings of multiple meals and snacks throughout the day with Ensure Shakes. RD to order Ensure to aid in caloric and protein needs. Pt was encouraged to eat her food at meals.   Nutrition-Focused physical exam completed. Findings are severe fat depletion, severe muscle depletion, and no edema.   Labs and medications reviewed.   Diet Order:  Diet full liquid Room service appropriate?: Yes; Fluid consistency:: Thin  Skin:  Wound (see comment) (Stage II on ischial, Unstageable on L hip, rash on L thigh)  Last BM:  2/20  Height:   Ht Readings from Last 1 Encounters:  11/16/15 5' 6.93" (1.7 m)    Weight:   Wt  Readings from Last 1 Encounters:  11/15/15 100 lb 1.4 oz (45.4 kg)    Ideal Body Weight:  59 kg  BMI:  Body mass index is 15.71 kg/(m^2).  Estimated Nutritional Needs:   Kcal:  1500-1700  Protein:  65-75 grams  Fluid:  >/= 1.5 L/day  EDUCATION NEEDS:   No education needs identified at this time  Corrin Parker, MS, RD, LDN Pager # [phone] After hours/ weekend pager # [phone]

## 2015-11-16 NOTE — Progress Notes (Signed)
Pt LS are crackly/wet. sats 84% on RA. Pt appears to have aspirated. MD notified. Held 1600 PO med and ensure.

## 2015-11-17 ENCOUNTER — Inpatient Hospital Stay (HOSPITAL_COMMUNITY): Payer: Medicare Other

## 2015-11-17 LAB — CBC WITH DIFFERENTIAL/PLATELET
BASOS ABS: 0 10*3/uL (ref 0.0–0.1)
Basophils Relative: 0 %
EOS PCT: 0 %
Eosinophils Absolute: 0 10*3/uL (ref 0.0–0.7)
HCT: 32.7 % — ABNORMAL LOW (ref 36.0–46.0)
Hemoglobin: 10.6 g/dL — ABNORMAL LOW (ref 12.0–15.0)
LYMPHS PCT: 8 %
Lymphs Abs: 1.2 10*3/uL (ref 0.7–4.0)
MCH: 26.4 pg (ref 26.0–34.0)
MCHC: 32.4 g/dL (ref 30.0–36.0)
MCV: 81.5 fL (ref 78.0–100.0)
MONO ABS: 0.8 10*3/uL (ref 0.1–1.0)
Monocytes Relative: 5 %
Neutro Abs: 13.8 10*3/uL — ABNORMAL HIGH (ref 1.7–7.7)
Neutrophils Relative %: 87 %
PLATELETS: 376 10*3/uL (ref 150–400)
RBC: 4.01 MIL/uL (ref 3.87–5.11)
RDW: 14.8 % (ref 11.5–15.5)
WBC: 15.8 10*3/uL — ABNORMAL HIGH (ref 4.0–10.5)

## 2015-11-17 LAB — COMPREHENSIVE METABOLIC PANEL
ALT: 12 U/L — ABNORMAL LOW (ref 14–54)
ANION GAP: 11 (ref 5–15)
AST: 19 U/L (ref 15–41)
Albumin: 1.7 g/dL — ABNORMAL LOW (ref 3.5–5.0)
Alkaline Phosphatase: 54 U/L (ref 38–126)
BUN: 26 mg/dL — ABNORMAL HIGH (ref 6–20)
CHLORIDE: 109 mmol/L (ref 101–111)
CO2: 24 mmol/L (ref 22–32)
Calcium: 9.2 mg/dL (ref 8.9–10.3)
Creatinine, Ser: 1.15 mg/dL — ABNORMAL HIGH (ref 0.44–1.00)
GFR, EST AFRICAN AMERICAN: 49 mL/min — AB (ref >60)
GFR, EST NON AFRICAN AMERICAN: 42 mL/min — AB (ref >60)
Glucose, Bld: 81 mg/dL (ref 65–99)
POTASSIUM: 3.3 mmol/L — AB (ref 3.5–5.1)
Sodium: 144 mmol/L (ref 135–145)
Total Bilirubin: 1.1 mg/dL (ref 0.3–1.2)
Total Protein: 5.6 g/dL — ABNORMAL LOW (ref 6.5–8.1)

## 2015-11-17 LAB — GLUCOSE, CAPILLARY
GLUCOSE-CAPILLARY: 70 mg/dL (ref 65–99)
GLUCOSE-CAPILLARY: 67 mg/dL (ref 65–99)
GLUCOSE-CAPILLARY: 90 mg/dL (ref 65–99)
GLUCOSE-CAPILLARY: 77 mg/dL (ref 65–99)
Glucose-Capillary: 88 mg/dL (ref 65–99)

## 2015-11-17 MED ORDER — GLUCOSE 40 % PO GEL
ORAL | Status: AC
  Administered 2015-11-17: 37.5 g
  Filled 2015-11-17: qty 1

## 2015-11-17 MED ORDER — DEXTROSE 50 % IV SOLN
INTRAVENOUS | Status: AC
  Administered 2015-11-17: 50 mL
  Filled 2015-11-17: qty 50

## 2015-11-17 MED ORDER — SODIUM CHLORIDE 0.9 % IV SOLN
INTRAVENOUS | Status: DC
  Administered 2015-11-20 – 2015-11-21 (×2): via INTRAVENOUS

## 2015-11-17 MED ORDER — COLLAGENASE 250 UNIT/GM EX OINT
TOPICAL_OINTMENT | Freq: Every day | CUTANEOUS | Status: DC
  Filled 2015-11-17: qty 30

## 2015-11-17 NOTE — Evaluation (Signed)
Clinical/Bedside Swallow Evaluation Patient Details  Name: Ashley Norman MRN: [id number] Date of Birth: 29-May-1928  Today's Date: 11/17/2015 Time: SLP Start Time (ACUTE ONLY): 1320 SLP Stop Time (ACUTE ONLY): 1345 SLP Time Calculation (min) (ACUTE ONLY): 25 min  Past Medical History:  Past Medical History  Diagnosis Date  . Asthma   . Hypertension   . Abnormal electrocardiogram   . Carotid bruit   . Diabetes mellitus   . Hypothyroidism   . Primary hyperparathyroidism ([place])   . GERD (gastroesophageal reflux disease)   . Hx of colonic polyps   . Renal insufficiency   . Headache(784.0)   . DJD (degenerative joint disease)   . Osteoporosis   . Anxiety    Past Surgical History:  Past Surgical History  Procedure Laterality Date  . Tonsillectomy and adenoidectomy    . Appendectomy     HPI:  82 y.o. female with PMH of asthma, GERD, DM, hypertension, dementia, anxiety, and chronic kidney disease stage III who presents to the ED with progressively worsening generalized weakness, lethargy, cough and decline in ADL's. Found to have sepsis secondary necrotizing PNAPer MD note, significantly decreased PO intake over this interval. mD  ote reports EKG demonstrated a sinus rhythm with left axis deviation and a nonspecific T-wave abnormality in the anterolateral leads. Chest x-ray large soft tissue density with cavitation that takes at the upper half of the left hemithorax. Blood work notable for a leukocytosis to 18,400, Radiologist raises concern for TB versus fungal infection versus cavitary bacterial pneumonia, or even malignancy.   Assessment / Plan / Recommendation Clinical Impression  Pt in a suboptimal position in bed after repositioned due to leaning to her left and legs contracted toward trunk. Delayed oral transit and suspected delayed swallow initiation. Although outward s/s aspiration not detected however pt at high risk due to dementia, poor positioning. Educated daughter re:  dysphagia and progression of dysphagia and what to possibly expect. Agree with Palliative consult. Recommend Dys 1 diet and thin liquids, upright as much as able, crush pills and straws allowed (limit size). ST to follow once more session.     Aspiration Risk  Severe aspiration risk    Diet Recommendation Dysphagia 1 (Puree);Thin liquid   Liquid Administration via: Straw;Cup Medication Administration: Crushed with puree Supervision: Staff to assist with self feeding;Full supervision/cueing for compensatory strategies Compensations: Slow rate;Small sips/bites Postural Changes: Seated upright at 90 degrees    Other  Recommendations Oral Care Recommendations: Oral care BID   Follow up Recommendations  None    Frequency and Duration min 2x/week  2 weeks       Prognosis Prognosis for Safe Diet Advancement: Fair Barriers to Reach Goals: Cognitive deficits      Swallow Study   General HPI: 82 y.o. female with PMH of asthma, GERD, DM, hypertension, dementia, anxiety, and chronic kidney disease stage III who presents to the ED with progressively worsening generalized weakness, lethargy, cough and decline in ADL's. Found to have sepsis secondary necrotizing PNAPer MD note, significantly decreased PO intake over this interval. mD  ote reports EKG demonstrated a sinus rhythm with left axis deviation and a nonspecific T-wave abnormality in the anterolateral leads. Chest x-ray large soft tissue density with cavitation that takes at the upper half of the left hemithorax. Blood work notable for a leukocytosis to 18,400, Radiologist raises concern for TB versus fungal infection versus cavitary bacterial pneumonia, or even malignancy. Type of Study: Bedside Swallow Evaluation Previous Swallow Assessment:  (none) Diet  Prior to this Study: NPO Temperature Spikes Noted: No Respiratory Status: Nasal cannula History of Recent Intubation: No Behavior/Cognition: Alert;Distractible;Requires  cueing;Confused;Cooperative Oral Cavity Assessment: Dry Oral Care Completed by SLP: No Oral Cavity - Dentition: Edentulous Self-Feeding Abilities: Needs assist;Needs set up Patient Positioning:  (semi upright position, leg contractures a barrier) Baseline Vocal Quality: Low vocal intensity Volitional Cough: Cognitively unable to elicit Volitional Swallow: Unable to elicit    Oral/Motor/Sensory Function Overall Oral Motor/Sensory Function:  (confused, unable, no focal weakness)   Ice Chips Ice chips: Not tested   Thin Liquid Thin Liquid: Impaired Presentation: Straw Oral Phase Impairments: Reduced labial seal Oral Phase Functional Implications: Right anterior spillage;Left anterior spillage Pharyngeal  Phase Impairments: Suspected delayed Swallow    Nectar Thick Nectar Thick Liquid: Not tested   Honey Thick Honey Thick Liquid: Not tested   Puree Puree: Impaired Oral Phase Impairments: Reduced lingual movement/coordination Pharyngeal Phase Impairments: Suspected delayed Swallow   Solid   GO   Solid: Not tested        Houston Siren 11/17/2015,3:17 PM Orbie Pyo Colvin Caroli.Ed [employer] [phone]

## 2015-11-17 NOTE — Consult Note (Addendum)
WOC consult requested for multiple pressure ulcers. Pt is very emeciated and contracted and incontinent with multiple systemic factors which can impair healing.  It is difficult to keep wounds from becoming soiled related to frequent incontinence.  Family member states she has not been eating.  Refer to extensive, through nursing notes on 2/18 for measurements and descriptions for present on admission pressure injuries: Patient's skin assessment completed with two RN's. Noted were multiple pressure points, abrasions, and scratches. Left trochanter-unstageable pressure injury : 6.5 cm x 5.5 100% Black eschar without odor or drainage with redness surrounding. Left lateral hip: 5.5cm x 3.5cm Reddish-dark purple deep tissue injury  Coccyx-unstageable pressure injury; .3X.3cm, 100% tightly adhered yellow slough  with deep tissue injury located nearby; 3cmx1cm Right inner knee: deep tissue injury :2.5cmx1.5 cm  Left outer foot-deep tissue injury in 2 locations : 2cmx1cm and 1X1cm,  Left outer pinkie-deep tissue injury 2cmx.5cm Right inner heel-red nonblanchable stage 1 pressure injury 2.5cmx2.5cm Left inner shin, red DTI 3cmx2.5cm Left inner foot deep tissue injury 2cmx1.5cm Right trochanter, stage 2 pressure injury, red and dry 4.5cmx2.5cm Right inner knee stage 2 pressure injury; 2X2X.1cm, pink and moist Right inner calf with deep tissue injury; 2X2cm Right upper outer thigh-Red raised rash, scabbed vesicles and across patchy areas of bilat buttocks; appearance consistent with possible shingles  Possible shingles would require systemic coverage and should be left open to air.  Foam dressings to deep tissue injuries and position off affected affected areas as often as possible.  Santyl to chemically debride nonviable tissue to sacrum and right hip.  Discussed plan of care with family member at the bedside. Pt is on an air mattress to reduce pressure. Multiple deep tissue injuries are high risk to evolve  into full thickness tissue loss despite optimal care. Please re-consult if further assistance is needed.  Thank-you,  Julien Girt MSN, [place], [place], [place], [place]

## 2015-11-17 NOTE — Progress Notes (Addendum)
Ashley Norman [id number] DOB: August 04, 1928 DOA: 11/15/2015 PCP: Noralee Space, MD  Brief narrative:  82 year old female Known history of asthma-poorly compliant with oral meds and MDIs Hypertension Carotid bruit with mild plaque in the past 01/2011 Diabetes mellitus Hypothyroid Primary hyperparathyroidism Discharge just disease with osteoporosis Senile dementia Excoriated rashes and adult failure to thrive  Admitted to [name] 11/14/15 with general decline over a three-week period of time where she is less interactive and able to speak and had a new cough On admission she was tachycardic and found to have a large chest soft tissue density with cavitation taking up left hemithorax .White count was 18 thrombocytosis noted 4500 and lactic acid 2.7 Patient IV fluid bolused alos found to have a rash opn the L upper thigh c/w shingles   Past medical history-As per Problem list Chart reviewed as below-   Consultants:  none  Procedures:    Antibiotics:  Levaquin 750 2/18-->2/20  Vancomycin 2/18-->2/20  Clindamycin 2/20------>11/28/15   Subjective   Awakens but is combative Not making sense Nursing reports low blood sugars this morning given Glucogel    Objective      Objective: Filed Vitals:   11/16/15 1607 11/16/15 1810 11/16/15 2110 11/16/15 2147  BP:  116/54  91/45  Pulse:  81  85  Temp:  97.4 F (36.3 C)  97.9 F (36.6 C)  TempSrc:  Oral  Oral  Resp:  20    Height:      Weight:    45.5 kg (100 lb 5 oz)  SpO2: 98% 95% 96% 95%    Intake/Output Summary (Last 24 hours) at 11/17/15 1031 Last data filed at 11/16/15 1034  Gross per 24 hour  Intake    120 ml  Output      0 ml  Net    120 ml     Data Reviewed: Basic Metabolic Panel:  Recent Labs Lab 11/11/2015 2112 11/14/15 0259 11/15/15 0859 11/17/15 0903  NA 142 142 138 144  K 4.5 4.0 3.7 3.3*  CL 98* 106 104 109  CO2 30 24 23 24   GLUCOSE 184* 107* 81 81  BUN 52* 45* 36*  26*  CREATININE 1.37* 1.04* 1.06* 1.15*  CALCIUM 10.7* 9.2 8.8* 9.2   Liver Function Tests:  Recent Labs Lab 11/18/2015 2112 11/17/15 0903  AST 24 19  ALT 21 12*  ALKPHOS 77 54  BILITOT 0.7 1.1  PROT 7.4 5.6*  ALBUMIN 2.2* 1.7*   No results for input(s): LIPASE, AMYLASE in the last 168 hours. No results for input(s): AMMONIA in the last 168 hours. CBC:  Recent Labs Lab 11/05/2015 2112 11/14/15 0259 11/15/15 0859 11/17/15 0903  WBC 18.4* 24.2* 17.4* 15.8*  NEUTROABS 15.4* 21.7* 15.3* 13.8*  HGB 13.8 9.6* 10.9* 10.6*  HCT 41.0 29.4* 33.3* 32.7*  MCV 83.8 84.5 84.5 81.5  PLT 452* 391 353 376   Cardiac Enzymes: No results for input(s): CKTOTAL, CKMB, CKMBINDEX, TROPONINI in the last 168 hours. BNP: Invalid input(s): POCBNP CBG:  Recent Labs Lab 11/16/15 1124 11/16/15 1658 11/16/15 2144 11/17/15 0648 11/17/15 0742  GLUCAP 119* 90 79 70 67    Recent Results (from the past 240 hour(s))  Blood Culture (routine x 2)     Status: None (Preliminary result)   Collection Time: 11/05/2015  9:11 PM  Result Value Ref Range Status   Specimen Description BLOOD LEFT ARM  Final   Special Requests BOTTLES DRAWN AEROBIC AND ANAEROBIC 5ML  Final  Culture NO GROWTH 3 DAYS  Final   Report Status PENDING  Incomplete  Blood Culture (routine x 2)     Status: None (Preliminary result)   Collection Time: 11/09/2015  9:23 PM  Result Value Ref Range Status   Specimen Description BLOOD RIGHT FOREARM  Final   Special Requests IN PEDIATRIC BOTTLE 1ML  Final   Culture NO GROWTH 3 DAYS  Final   Report Status PENDING  Incomplete    Studies:                All Imaging reviewed and is as per above notation    Scheduled Meds: . acyclovir  450 mg Intravenous Q24H  . antiseptic oral rinse  7 mL Mouth Rinse BID  . budesonide  0.5 mg Nebulization BID  . clindamycin  300 mg Oral 3 times per day  . dextrose      . feeding supplement (ENSURE ENLIVE)  237 mL Oral TID BM  . heparin  5,000 Units  Subcutaneous 3 times per day  . insulin aspart  0-9 Units Subcutaneous 6 times per day  . levalbuterol  0.63 mg Nebulization QID  . tiotropium  18 mcg Inhalation Daily   Continuous Infusions:     Assessment/Plan:    1.  Sepsis 2/2 Necrotizing Aspiration PNA [always recumbent onto L lat position]. DDX broad-Airborne isolation-r/o TB.  Transition IV Levaquin/vancomycin--> clindamycin 300 every 8-->stop date 11/28/15  given abscess.  Seems stable however had some increasing shortness of breath on 2/20. DDX  unlikely -labs that are Pending are asperigullus, crypotcoccus [neg], Quantiferon.    Blood cult ng so far.  WBC trending 18--24--17-->15.  Rpt CXR 2/19 and 2/21 confirms cavitary lung lesion- CBC + diff in am 2. Dysphagia-Dysphagia 1 and thin liquids per speech therapist input 3. No component of Pulm edema-mild effusion-re-start saline 50 cc/hr 11/17/15 4. Shingles vs bullous pemphigoid ?-start Acyclovir per pharmacy. Duration Rx ~7-10 days-cont cream to the area.  Stop date 11/22/68 5. Hypoglycemia noted 2/21-discontinued sliding scale coverage. Monitor blood glucose every 4 hourly and treat as appropriate 6. Severe asthma with poor compliance-cont budesonide 0.5 bid, continue Xopenex 0.3 qid neb-restart spiriva 18. Theophylline and has been held  7. Hypertension-hold lisinopril 20 daily as admission bun/creat 52/1.37-->36/1.06-->26/1.15 on 11/17/15 8. Carotid bruit with mild plaque in the past 01/2011-continue ASA 9. H/o Diabetes mellitus-currently on no coverage-monitor am CBG on bmets.   10. Hypothyroid-continue synthroid 75 daily 11. Primary hyperparathyroidism-s/p surgery 12. Multiple Decubiti-dressings as per wound nurse 13. Senile dementia-severe.  Long discussion with family 2/18 + 2/19.  Given mild improvement expect slow recovery from this.  Will consult Pallaitve care   Discussed with one daughter on 11/15/15 Discussed with family on telephone 2/21 DO NOT RESUSCITATE I discussed  with family possibility for palliative involvement and they want to think on this a little bit more. I will order palliative consult today 2/21 and family can cancel the same if not interested in the same Possible skilled placement? Therapy to evaluate   Verneita Griffes, MD  [facility] Pager [phone] 11/17/2015, 10:31 AM    LOS: 4 days

## 2015-11-18 DIAGNOSIS — R627 Adult failure to thrive: Secondary | ICD-10-CM

## 2015-11-18 DIAGNOSIS — J189 Pneumonia, unspecified organism: Secondary | ICD-10-CM

## 2015-11-18 DIAGNOSIS — L8990 Pressure ulcer of unspecified site, unspecified stage: Secondary | ICD-10-CM

## 2015-11-18 DIAGNOSIS — Z515 Encounter for palliative care: Secondary | ICD-10-CM | POA: Insufficient documentation

## 2015-11-18 DIAGNOSIS — E43 Unspecified severe protein-calorie malnutrition: Secondary | ICD-10-CM | POA: Insufficient documentation

## 2015-11-18 LAB — QUANTIFERON IN TUBE
QFT TB AG MINUS NIL VALUE: 0 [IU]/mL
QUANTIFERON MITOGEN VALUE: 0.06 [IU]/mL
QUANTIFERON NIL VALUE: 0.03 [IU]/mL
QUANTIFERON TB AG VALUE: 0.03 [IU]/mL
QUANTIFERON TB GOLD: UNDETERMINED

## 2015-11-18 LAB — CULTURE, BLOOD (ROUTINE X 2)
CULTURE: NO GROWTH
Culture: NO GROWTH

## 2015-11-18 LAB — COMPREHENSIVE METABOLIC PANEL
ALK PHOS: 49 U/L (ref 38–126)
ALT: 13 U/L — AB (ref 14–54)
AST: 15 U/L (ref 15–41)
Albumin: 1.6 g/dL — ABNORMAL LOW (ref 3.5–5.0)
Anion gap: 13 (ref 5–15)
BILIRUBIN TOTAL: 0.9 mg/dL (ref 0.3–1.2)
BUN: 26 mg/dL — AB (ref 6–20)
CALCIUM: 9.2 mg/dL (ref 8.9–10.3)
CO2: 22 mmol/L (ref 22–32)
CREATININE: 1.16 mg/dL — AB (ref 0.44–1.00)
Chloride: 109 mmol/L (ref 101–111)
GFR, EST AFRICAN AMERICAN: 48 mL/min — AB (ref >60)
GFR, EST NON AFRICAN AMERICAN: 42 mL/min — AB (ref >60)
Glucose, Bld: 72 mg/dL (ref 65–99)
Potassium: 3 mmol/L — ABNORMAL LOW (ref 3.5–5.1)
Sodium: 144 mmol/L (ref 135–145)
TOTAL PROTEIN: 5.3 g/dL — AB (ref 6.5–8.1)

## 2015-11-18 LAB — STREP PNEUMONIAE URINARY ANTIGEN: STREP PNEUMO URINARY ANTIGEN: NEGATIVE

## 2015-11-18 LAB — CBC
HEMATOCRIT: 28.7 % — AB (ref 36.0–46.0)
Hemoglobin: 9.7 g/dL — ABNORMAL LOW (ref 12.0–15.0)
MCH: 27.9 pg (ref 26.0–34.0)
MCHC: 33.8 g/dL (ref 30.0–36.0)
MCV: 82.5 fL (ref 78.0–100.0)
PLATELETS: 346 10*3/uL (ref 150–400)
RBC: 3.48 MIL/uL — AB (ref 3.87–5.11)
RDW: 15 % (ref 11.5–15.5)
WBC: 15.4 10*3/uL — AB (ref 4.0–10.5)

## 2015-11-18 LAB — URINALYSIS, ROUTINE W REFLEX MICROSCOPIC
GLUCOSE, UA: NEGATIVE mg/dL
Hgb urine dipstick: NEGATIVE
KETONES UR: NEGATIVE mg/dL
LEUKOCYTES UA: NEGATIVE
Nitrite: NEGATIVE
PH: 5 (ref 5.0–8.0)
Protein, ur: NEGATIVE mg/dL
SPECIFIC GRAVITY, URINE: 1.013 (ref 1.005–1.030)

## 2015-11-18 LAB — GLUCOSE, CAPILLARY
GLUCOSE-CAPILLARY: 66 mg/dL (ref 65–99)
GLUCOSE-CAPILLARY: 71 mg/dL (ref 65–99)
Glucose-Capillary: 73 mg/dL (ref 65–99)
Glucose-Capillary: 196 mg/dL — ABNORMAL HIGH (ref 65–99)
Glucose-Capillary: 131 mg/dL — ABNORMAL HIGH (ref 65–99)
Glucose-Capillary: 107 mg/dL — ABNORMAL HIGH (ref 65–99)
Glucose-Capillary: 135 mg/dL — ABNORMAL HIGH (ref 65–99)

## 2015-11-18 LAB — QUANTIFERON TB GOLD ASSAY (BLOOD)

## 2015-11-18 MED ORDER — DEXTROSE 50 % IV SOLN
INTRAVENOUS | Status: AC
  Administered 2015-11-18: 50 mL
  Filled 2015-11-18: qty 50

## 2015-11-18 MED ORDER — POTASSIUM CHLORIDE 10 MEQ/100ML IV SOLN
10.0000 meq | INTRAVENOUS | Status: AC
  Administered 2015-11-18 – 2015-11-19 (×4): 10 meq via INTRAVENOUS
  Filled 2015-11-18 (×4): qty 100

## 2015-11-18 MED ORDER — FUROSEMIDE 10 MG/ML IJ SOLN
40.0000 mg | Freq: Once | INTRAMUSCULAR | Status: AC
  Administered 2015-11-18: 40 mg via INTRAVENOUS

## 2015-11-18 MED ORDER — CLINDAMYCIN PHOSPHATE 300 MG/50ML IV SOLN
300.0000 mg | Freq: Three times a day (TID) | INTRAVENOUS | Status: DC
  Administered 2015-11-18 – 2015-11-22 (×11): 300 mg via INTRAVENOUS
  Filled 2015-11-18 (×14): qty 50

## 2015-11-18 MED ORDER — INSULIN ASPART 100 UNIT/ML ~~LOC~~ SOLN: 0.0000 [IU] | Freq: Three times a day (TID) | SUBCUTANEOUS | Status: DC

## 2015-11-18 MED ORDER — POTASSIUM CHLORIDE CRYS ER 20 MEQ PO TBCR: 40.0000 meq | EXTENDED_RELEASE_TABLET | Freq: Once | ORAL | Status: DC

## 2015-11-18 MED ORDER — FUROSEMIDE 10 MG/ML IJ SOLN
INTRAMUSCULAR | Status: AC
  Filled 2015-11-18: qty 4

## 2015-11-18 NOTE — Care Management Important Message (Signed)
Important Message  Patient Details  Name: Ashley Norman MRN: [id number] Date of Birth: 07/25/1928   Medicare Important Message Given:  Yes    Evolett Somarriba, Rory Percy, RN 11/18/2015, 10:59 AM

## 2015-11-18 NOTE — Progress Notes (Signed)
[facility] PROGRESS NOTE  Ashley Norman [id number] DOB: [birth year] DOA: 11/18/2015 PCP: Noralee Space, MD  brief narrative 82 year old female with progressive dementia with failure to thrive, asthma, hypertension, diabetes mellitus, who was admitted to the hospital/akinesis history 70 with progressive decline infarction with deconditioning, being poorly interactive, cardiac on presentation and was found to have a large chest soft tissue density with cavitation over her left hemithorax. She was septic with tachycardia, and obesity of 18 K, lactic acid of 2.7. Also had a rash over her left upper thigh concerning for shingles. Admitted to hospital service for sepsis due to organizing pneumonia. Patient showing poor improvement in overall symptoms. Discussed with family and palliative care consulted for goals of care discussion.   Assessment/Plan: Sepsis secondary to necrotizing pneumonia Concern for aspiration. Initially started on vancomycin and Levaquin and switch to endomysial. Patient afebrile but showing poor overall response to treatment. Blood cultures have been negative. She is severely deconditioned and contracted. Has very poor prognosis. Discussed with son on the phone and he agrees on also care discussion with palliative care. We'll continue current antibiotics and hold off on further imaging (CT chest) and consults (ID) depending upon palliative care discussion.  ? Shingles On empiric acyclovir.  Hypertension Hold lisinopril given acute kidney injury on presentation. Blood pressure stable.  History of carotid plaque Continue aspirin.  History of diabetes mellitus with hypoglycemia Activity due to poor by mouth intake. Will resume sensitive sliding scale coverage.   Hypothyroidism Continue Synthroid.  Hypokalemia Replenished  Multiple decubitus pressure ulcers Appreciate wound care follow-up   DVT prophylaxis: Subcutaneous heparin Diet: Dysphagia level  I   Code Status: DO NOT RESUSCITATE Family Communication: Discussed with son Dominica Severin on the phone and strain patient's overall poor prognosis. He is open to close of care discussion with palliative care but wants to have his sister present as well  Disposition Plan: Pending palliative care discussion.   Consultants:  Palliative care  Procedures:  None  Antibiotics:  ILevaquin 750 2/18-->2/20  Vancomycin 2/18-->2/20  Clindamycin 2/20------  HPI/Subjective: Seen and and examined. No overnight issues.  Objective: Filed Vitals:   11/18/15 0450 11/18/15 0815  BP: 106/59 124/60  Pulse: 93 88  Temp: 98.4 F (36.9 C) 98.7 F (37.1 C)  Resp: 20 17    Intake/Output Summary (Last 24 hours) at 11/18/15 1409 Last data filed at 11/18/15 0900  Gross per 24 hour  Intake      0 ml  Output      0 ml  Net      0 ml   Filed Weights   11/15/15 0900 11/15/15 2124 11/16/15 2147  Weight: 45.36 kg (100 lb) 45.4 kg (100 lb 1.4 oz) 45.5 kg (100 lb 5 oz)    Exam:   General:  Elderly female lying in bed in no acute distress, poorly communicative  HEENT: No pallor, dry mucosa  Chest: Poor respiratory effort bilaterally, no added sounds  CVS: Normal S1 and S2: Normal color gallop  GI: Soft, nondistended, nontender, bowel sounds present  Musculoskeletal: severely Contracted,   CNS: alert and awake, poorly communicative, oriented to self only  Data Reviewed: Basic Metabolic Panel:  Recent Labs Lab 11/03/2015 2112 11/14/15 0259 11/15/15 0859 11/17/15 0903 11/18/15 0815  NA 142 142 138 144 144  K 4.5 4.0 3.7 3.3* 3.0*  CL 98* 106 104 109 109  CO2 30 24 23 24 22   GLUCOSE 184* 107* 81 81 72  BUN 52* 45* 36* 26*  26*  CREATININE 1.37* 1.04* 1.06* 1.15* 1.16*  CALCIUM 10.7* 9.2 8.8* 9.2 9.2   Liver Function Tests:  Recent Labs Lab 11/24/2015 2112 11/17/15 0903 11/18/15 0815  AST 24 19 15   ALT 21 12* 13*  ALKPHOS 77 54 49  BILITOT 0.7 1.1 0.9  PROT 7.4 5.6* 5.3*   ALBUMIN 2.2* 1.7* 1.6*   No results for input(s): LIPASE, AMYLASE in the last 168 hours. No results for input(s): AMMONIA in the last 168 hours. CBC:  Recent Labs Lab 11/03/2015 2112 11/14/15 0259 11/15/15 0859 11/17/15 0903 11/18/15 0815  WBC 18.4* 24.2* 17.4* 15.8* 15.4*  NEUTROABS 15.4* 21.7* 15.3* 13.8*  --   HGB 13.8 9.6* 10.9* 10.6* 9.7*  HCT 41.0 29.4* 33.3* 32.7* 28.7*  MCV 83.8 84.5 84.5 81.5 82.5  PLT 452* 391 353 376 346   Cardiac Enzymes: No results for input(s): CKTOTAL, CKMB, CKMBINDEX, TROPONINI in the last 168 hours. BNP (last 3 results) No results for input(s): BNP in the last 8760 hours.  ProBNP (last 3 results) No results for input(s): PROBNP in the last 8760 hours.  CBG:  Recent Labs Lab 11/17/15 2044 11/18/15 0012 11/18/15 0443 11/18/15 0811 11/18/15 0928  GLUCAP 77 71 73 66 196*    Recent Results (from the past 240 hour(s))  Blood Culture (routine x 2)     Status: None   Collection Time: 10/29/2015  9:11 PM  Result Value Ref Range Status   Specimen Description BLOOD LEFT ARM  Final   Special Requests BOTTLES DRAWN AEROBIC AND ANAEROBIC 5ML  Final   Culture NO GROWTH 5 DAYS  Final   Report Status 11/18/2015 FINAL  Final  Blood Culture (routine x 2)     Status: None   Collection Time: 11/19/2015  9:23 PM  Result Value Ref Range Status   Specimen Description BLOOD RIGHT FOREARM  Final   Special Requests IN PEDIATRIC BOTTLE 1ML  Final   Culture NO GROWTH 5 DAYS  Final   Report Status 11/18/2015 FINAL  Final     Studies: Dg Chest Port 1 View  11/17/2015  CLINICAL DATA:  Pneumonia EXAM: PORTABLE CHEST 1 VIEW COMPARISON:  11/16/2015 FINDINGS: Continued dense consolidation in the left upper lobe with possible cavitation. Small to moderate left pleural effusion. No focal opacity on the right. Heart is borderline in size. There is hyperinflation of the lungs compatible with COPD. IMPRESSION: No significant change since prior study. Electronically  Signed   By: Rolm Baptise M.D.   On: 11/17/2015 08:19   Dg Chest Port 1 View  11/16/2015  CLINICAL DATA:  Aspiration into airway.  Cough today. EXAM: PORTABLE CHEST 1 VIEW COMPARISON:  Prior radiographs 11/15/2015, and 11/23/2015 FINDINGS: Left upper lobe consolidation with probable cavitary component, with perhaps decreased density peripherally compared to prior. Volume loss in the left hemithorax with probable left pleural effusion and left basilar opacity, unchanged. Patient remains rotated. No new abnormality in the right lung. Rounded soft tissue density projecting over the right lung base may be related to external soft tissue from the patient's arm, however attention to this at follow-up is recommended. Cardiomediastinal contours are unchanged. IMPRESSION: 1. Left upper lobe opacity with apparent cavitation, with perhaps decreased density in the periphery compared to prior exams. Unchanged volume loss in the left lung with probable left pleural effusion. 2. No focal abnormality in the right lung to suggest aspiration. Apparent rounded soft tissue density about the right lung base is likely external and related to patient's  arm, however attention to this at follow-up recommended. Electronically Signed   By: Jeb Levering M.D.   On: 11/16/2015 18:32    Scheduled Meds: . acyclovir  450 mg Intravenous Q24H  . antiseptic oral rinse  7 mL Mouth Rinse BID  . budesonide  0.5 mg Nebulization BID  . clindamycin  300 mg Oral 3 times per day  . collagenase   Topical Daily  . feeding supplement (ENSURE ENLIVE)  237 mL Oral TID BM  . heparin  5,000 Units Subcutaneous 3 times per day  . levalbuterol  0.63 mg Nebulization QID  . tiotropium  18 mcg Inhalation Daily   Continuous Infusions: . sodium chloride        Time spent: 25 minutes    Lucila Klecka, [place]  [facility] Pager [phone]. If 7PM-7AM, please contact night-coverage at [email or web address], password [facility] 11/18/2015, 2:09 PM  LOS: 5  days

## 2015-11-18 NOTE — Progress Notes (Signed)
Pharmacy Antibiotic Note  Ashley Norman is a 82 y.o. female admitted on 11/24/2015 with general decline.  Pharmacy has been consulted for acyclovir dosing for suspected shingles.  Plan: This patient's current antibiotics will be continued without adjustments.- Acyclovir 10 mg/kg (450 mg) IV q24h x 7 days - last dose due 2/25 - Clindamycin 300mg  PO q8h per MD- last dose due 3/4  Height: 5' 6.93" (170 cm) (historic height) Weight: 100 lb 5 oz (45.5 kg) IBW/kg (Calculated) : 61.44    Temp (24hrs), Avg:98.4 F (36.9 C), Min:98 F (36.7 C), Max:98.7 F (37.1 C)   Recent Labs Lab 10/30/2015 2112 11/06/2015 2128 11/14/15 0029 11/14/15 0259 11/15/15 0859 11/17/15 0903 11/18/15 0815  WBC 18.4*  --   --  24.2* 17.4* 15.8* 15.4*  CREATININE 1.37*  --   --  1.04* 1.06* 1.15* 1.16*  LATICACIDVEN  --  2.75* 2.45*  --   --   --   --     Estimated Creatinine Clearance: 25.5 mL/min (by C-G formula based on Cr of 1.16).    Allergies  Allergen Reactions  . Amoxicillin-Pot Clavulanate     REACTION: rash, itching  . Cephalexin     REACTION: rash, itching  . Codeine     REACTION: rash, itching    Antimicrobials this admission:  Vanc 2/17 >>2/20 Levaquin 2/17 >>2/20 Acyclovir 2/19 >> (2/25) Clinda 2/20>>(3/4)  Dose adjustments this admission: N/A  Microbiology results: 2/17 Blood cx: NGTD 2/18 aspergillus antibody: ip 2/18 cryptococcal ag: neg 2/17 quantiferon tb gold: ip  Thank you for allowing pharmacy to be a part of this patient's care. Shanley Furlough D. Huntleigh Doolen, PharmD, BCPS Clinical Pharmacist Pager: [phone] 11/18/2015 12:19 PM

## 2015-11-18 NOTE — Progress Notes (Signed)
Tele notified RN of patient having a long run of SVT. On call NP notified. RN will continue to monitor patient.  Ermalinda Memos, RN

## 2015-11-18 NOTE — Progress Notes (Signed)
Pt noted to be dypneic, with congested cough, 02 77% pt removed oxygen. Placed on 02, sats came up to 88%. Placed on non-rebreather mask, sats came up to the 90's administered breathing treatment. RT at bedside. Pt. Sat 94% on 4L 02. MD aware. Orders received.

## 2015-11-18 NOTE — Progress Notes (Signed)
Consult request received Patient seen and examined Discussed with Dr Clementeen Graham Call placed and message left for son Tuba Calliham this am. Call placed and discussed with daughter Butch Penny this afternoon: PLAN: Family meeting tomorrow to discuss goals of care with son and daughter around 4 or 4:15 PM tomorrow.  Full note and additional recommendations will follow.   Loistine Chance MD [place] team [phone]

## 2015-11-18 NOTE — Clinical Documentation Improvement (Signed)
Hospitalist  Can the diagnosis of pressure ulcer be further specified?  Please see note of Wound nurse.  Other  Clinically Undetermined    Supporting Information:  From wound nurse -  Left trochanter-unstageable pressure injury : 6.5 cm x 5.5 100% Black eschar without odor or drainage with redness surrounding.  Left lateral hip: 5.5cm x 3.5cm Reddish-dark purple deep tissue injury  Coccyx-unstageable pressure injury; .3X.3cm, 100% tightly adhered yellow slough  with deep tissue injury located nearby; 3cmx1cm  Right inner knee: deep tissue injury :2.5cmx1.5 cm  Left outer foot-deep tissue injury in 2 locations : 2cmx1cm and 1X1cm,  Left outer pinkie-deep tissue injury 2cmx.5cm  Right inner heel-red nonblanchable stage 1 pressure injury 2.5cmx2.5cm  Left inner shin, red DTI 3cmx2.5cm  Left inner foot deep tissue injury 2cmx1.5cm  Right trochanter, stage 2 pressure injury, red and dry 4.5cmx2.5cm  Right inner knee stage 2 pressure injury; 2X2X.1cm, pink and moist  Right inner calf with deep tissue injury; 2X2cm  Right upper outer thigh-Red raised rash, scabbed vesicles and across patchy areas of bilat buttocks; appearance consistent with possible shingles      Please exercise your independent, professional judgment when responding. A specific answer is not anticipated or expected.   Thank You,  [place] [phone]

## 2015-11-19 DIAGNOSIS — Z7189 Other specified counseling: Secondary | ICD-10-CM | POA: Insufficient documentation

## 2015-11-19 DIAGNOSIS — A419 Sepsis, unspecified organism: Principal | ICD-10-CM

## 2015-11-19 DIAGNOSIS — J181 Lobar pneumonia, unspecified organism: Secondary | ICD-10-CM | POA: Insufficient documentation

## 2015-11-19 LAB — LEGIONELLA ANTIGEN, URINE

## 2015-11-19 LAB — ASPERGILLUS ANTIBODY (COMPLEMENT FIX): Aspergillus Antibody by Complement Fix: <1:8 {titer}

## 2015-11-19 LAB — URINE CULTURE: CULTURE: NO GROWTH

## 2015-11-19 LAB — GLUCOSE, CAPILLARY
GLUCOSE-CAPILLARY: 77 mg/dL (ref 65–99)
GLUCOSE-CAPILLARY: 91 mg/dL (ref 65–99)
GLUCOSE-CAPILLARY: 109 mg/dL — AB (ref 65–99)
GLUCOSE-CAPILLARY: 85 mg/dL (ref 65–99)
Glucose-Capillary: 107 mg/dL — ABNORMAL HIGH (ref 65–99)
Glucose-Capillary: 82 mg/dL (ref 65–99)

## 2015-11-19 MED ORDER — PRO-STAT SUGAR FREE PO LIQD
30.0000 mL | Freq: Every day | ORAL | Status: DC
  Filled 2015-11-19 (×2): qty 30

## 2015-11-19 NOTE — Consult Note (Addendum)
Consultation Note Date: 11/19/2015   Patient Name: Ashley Norman  DOB: 1928-04-19  MRN: [id number]  Age / Sex: 82 y.o., female  PCP: Noralee Space, MD Referring Physician: Louellen Molder, MD  Reason for Consultation: Establishing goals of care    Clinical Assessment/Narrative: Patient is a elderly 82 year old lady who lives at home with her son. She has underlying past medical history significant for hypertension, diabetes, stage III chronic kidney disease. She has underlying severe protein calorie malnutrition and pressure ulcer. She has hypothyroidism and history of asthma. She has been admitted with 3 week period of being less interactive and having cough. Patient has been diagnosed with large chest soft tissue density with cavitation taking up most of her left hemithorax. She was found to have leukocytosis thrombocytosis elevated lactic acid. She was given IV fluid boluses. She was given antibiotics several oxygen and nebulized breathing treatments. Shunt has underlying progressive dementia and failure to thrive as well. For questionable shingles she has been also started on empiric acyclovir. Patient continues to not improve throughout the course of this hospitalization. Oral intake is minimal to nil. Patient is not very awake alert. Palliative care consultation for goals of care discussions.  Patient seen and examined. She is able to open her eyes when her name is called otherwise is not able to provide any history. She is pale and frail. She has bilateral upper and lower shotty contractures. Call placed and unable to reach son Tynesha Febles 24 as well as 2-20 [phone]. Hence, discussed with patient's daughter Butch Penny in detail over the phone. Scope of palliative medicine service introduced. Discussed about the patient's current failure to thrive in current scope of hospitalization. Discussed about the patient's high  likelihood of not making any kind of meaningful recovery. Discussed patient's high likelihood of dying within the next week or 2. Discussed about final palliative recommendations for transfer to residential hospice and a singular focus on comfort measures only.  All questions answered. Butch Penny will discuss with her other siblings. Patient has 6 children. Await her call back with final decision. Continue gentle treatments as her being given now until then.  Contacts/Participants in Discussion: Primary Decision Maker:    Relationship to Patient  HCPOA: no  There is no established healthcare power of attorney agent. Contact listed as scary Lapenna at [phone], and contact listed as patient's daughter Butch Penny.  SUMMARY OF RECOMMENDATIONS: Family meeting with patient's daughter Butch Penny over the phone. Recommended family meeting in person. Patient's son Glayds Rohweder works until late in the evening. Daughter Butch Penny also stated that it was hard to coordinate a time for everybody to meet. Full scope of palliative discussions undertaken over the phone. All questions answered to the best of my ability. Discussed about final recommendations regarding full scope of comfort measures and transitioning the patient to residential hospice towards the end of this hospitalization. Butch Penny will discuss with her siblings. Patient has 6 children in total. Daughter Butch Penny is been given information on how to contact the palliative service- [phone].  Code Status/Advance Care Planning: DNR    Code Status Orders        Start     Ordered   11/14/15 0207  Do not attempt resuscitation (DNR)   Continuous    Question Answer Comment  In the event of cardiac or respiratory ARREST Do not call a "code blue"   In the event of cardiac or respiratory ARREST Do not perform Intubation, CPR, defibrillation or ACLS   In the event  of cardiac or respiratory ARREST Use medication by any route, position, wound care, and other measures to  relive pain and suffering. May use oxygen, suction and manual treatment of airway obstruction as needed for comfort.      11/14/15 0210    Code Status History    Date Active Date Inactive Code Status Order ID Comments User Context   11/04/2015  9:16 PM 11/14/2015  2:10 AM DNR [id number]  Dorie Rank, MD ED      Other Directives:None  Symptom Management:   As above  Palliative Prophylaxis:   Delirium Protocol  Additional Recommendations (Limitations, Scope, Preferences):  Recommend full focus on comfort measures, transition to residential hospice.   Psycho-social/Spiritual:  Support System: [place] Desire for further Chaplaincy support:no Additional Recommendations: Education on Hospice  Prognosis: < 2 weeks  Discharge Planning: Hospice facility is highly recommended.   Chief Complaint/ Primary Diagnoses: Present on Admission:  . CAP (community acquired pneumonia) . Sepsis ([place]) . Essential hypertension . Asthma . Anxiety . Hypothyroidism . FTT (failure to thrive) in adult . Borderline diabetes mellitus . CKD (chronic kidney disease), stage III  I have reviewed the medical record, interviewed the patient and family, and examined the patient. The following aspects are pertinent.  Past Medical History  Diagnosis Date  . Asthma   . Hypertension   . Abnormal electrocardiogram   . Carotid bruit   . Diabetes mellitus   . Hypothyroidism   . Primary hyperparathyroidism ([place])   . GERD (gastroesophageal reflux disease)   . Hx of colonic polyps   . Renal insufficiency   . Headache(784.0)   . DJD (degenerative joint disease)   . Osteoporosis   . Anxiety    Social History   Social History  . Marital Status: Widowed    Spouse Name: N/A  . Number of Children: 6  . Years of Education: N/A   Social History Main Topics  . Smoking status: Never Smoker   . Smokeless tobacco: Never Used  . Alcohol Use: No  . Drug Use: No  . Sexual Activity: Not Asked   Other Topics  Concern  . None   Social History Narrative   Family History  Problem Relation Age of Onset  . Cerebral aneurysm Mother     hemorrhage/deceased at 75  . Heart failure Father     deceased age 56  . Heart failure Sister   . Lung cancer Brother   . Leukemia Brother   . Colon cancer Brother   . Stomach cancer Brother    Scheduled Meds: . acyclovir  450 mg Intravenous Q24H  . antiseptic oral rinse  7 mL Mouth Rinse BID  . budesonide  0.5 mg Nebulization BID  . clindamycin  300 mg Oral 3 times per day  . clindamycin (CLEOCIN) IV  300 mg Intravenous 3 times per day  . collagenase   Topical Daily  . feeding supplement (ENSURE ENLIVE)  237 mL Oral TID BM  . heparin  5,000 Units Subcutaneous 3 times per day  . insulin aspart  0-9 Units Subcutaneous TID WC  . levalbuterol  0.63 mg Nebulization QID  . tiotropium  18 mcg Inhalation Daily   Continuous Infusions: . sodium chloride     PRN Meds:.acetaminophen, levalbuterol, LORazepam Medications Prior to Admission:  Prior to Admission medications   Medication Sig Start Date End Date Taking? Authorizing Provider  budesonide (PULMICORT) 0.5 MG/2ML nebulizer solution Take 2 mLs (0.5 mg total) by nebulization 2 (two) times daily. 12/12/14  Yes Noralee Space, MD  fluocinonide-emollient (LIDEX-E) 0.05 % cream Apply 1 application topically daily as needed. Apply to spots per daughter   Yes Historical Provider, MD  levalbuterol Penne Lash) 0.63 MG/3ML nebulizer solution USE 1 VIAL 4 TIMES A DAY 12/24/14  Yes Noralee Space, MD  megestrol (MEGACE) 40 MG/ML suspension TAKE 1 TEASPOON BY MOUTH THREE TIMES A DAY 07/20/15  Yes Noralee Space, MD  Multiple Vitamin (MULTIVITAMIN PO) Take 1 tablet by mouth daily.     Yes Historical Provider, MD  PROAIR HFA 108 (90 BASE) MCG/ACT inhaler INHALE 2 PUFFS EVERY 6 HOURS IF NEEDED FOR WHEEZING 10/03/14  Yes Noralee Space, MD  SPIRIVA HANDIHALER 18 MCG inhalation capsule inhale contents of 1 capsule by mouth once daily  08/03/13  Yes Noralee Space, MD  azithromycin [facility]) 200 MG/5ML suspension Take 10 ml by mouth on first day, then 5 ml by mouth daily x 4 days. Patient not taking: Reported on 11/02/2015 11/05/15   Deneise Lever, MD  fluconazole (DIFLUCAN) 100 MG tablet Take 2 pills by mouth now, and then 1 pill by mouth daily until gone. Patient not taking: Reported on 10/28/2015 03/03/15   Noralee Space, MD  fluocinonide-emollient (LIDEX-E) 0.05 % cream apply to affected area as directed Patient not taking: Reported on 11/03/2015 01/01/15   Noralee Space, MD  hydrOXYzine (ATARAX/VISTARIL) 10 MG tablet Take 1 tablet (10 mg total) by mouth 4 (four) times daily as needed for itching. Patient not taking: Reported on 11/17/2015 12/12/14   Noralee Space, MD  levothyroxine (SYNTHROID, LEVOTHROID) 75 MCG tablet take 1 tablet by mouth once daily Patient not taking: Reported on 11/23/2015 02/27/15   Noralee Space, MD  lisinopril (PRINIVIL,ZESTRIL) 20 MG tablet Take 10 mg by mouth daily.   05/13/11 03/31/14  Noralee Space, MD  lisinopril (PRINIVIL,ZESTRIL) 20 MG tablet take 1 tablet by mouth once daily Patient not taking: Reported on 11/19/2015    Noralee Space, MD  LORazepam (ATIVAN) 1 MG tablet Take 0.5-1 tablets (0.5-1 mg total) by mouth 3 (three) times daily as needed for anxiety. Patient not taking: Reported on 11/10/2015 01/07/13   Noralee Space, MD  potassium chloride (K-DUR) 10 MEQ tablet Take 1 tablet (10 mEq total) by mouth daily. 06/11/13 06/11/14  Noralee Space, MD  predniSONE (DELTASONE) 10 MG tablet take 2 tablet by mouth once daily Patient not taking: Reported on 11/06/2015 02/27/15   Noralee Space, MD  PROAIR HFA 108 (90 BASE) MCG/ACT inhaler inhale 2 puffs by mouth every 6 hours if needed for wheezing Patient not taking: Reported on 11/11/2015 09/07/15   Noralee Space, MD  sertraline (ZOLOFT) 50 MG tablet take 1 tablet by mouth once daily Patient not taking: Reported on 10/29/2015 06/08/13   Noralee Space, MD  theophylline  (THEODUR) 300 MG 12 hr tablet TAKE 1 TABLET BY MOUTH DAILY Patient not taking: Reported on 10/29/2015 04/27/15   Noralee Space, MD   Allergies  Allergen Reactions  . Amoxicillin-Pot Clavulanate     REACTION: rash, itching  . Cephalexin     REACTION: rash, itching  . Codeine     REACTION: rash, itching    Review of Systems unable to verbalize Physical Exam Weak frail elderly lady resting in bed. Patient is on supplemental oxygen. She opens her eyes but is otherwise poorly communicative. She is not even up to state her name. Does not visibly appear to be in  any respiratory distress. S1-S2. Bilateral contractures upper extremities and lower extremities Abdomen soft nondistended nontender Patient opens eyes when her name is called mumbles incoherently. Vital Signs: BP 115/50 mmHg  Pulse 88  Temp(Src) 100.8 F (38.2 C) (Axillary)  Resp 18  Ht 5' 6.93" (1.7 m)  Wt 45.6 kg (100 lb 8.5 oz)  BMI 15.78 kg/m2  SpO2 92%  SpO2: SpO2: 92 % O2 Device:SpO2: 92 % O2 Flow Rate: .O2 Flow Rate (L/min): 2 L/min  IO: Intake/output summary:  Intake/Output Summary (Last 24 hours) at 11/19/15 1053 Last data filed at 11/19/15 0946  Gross per 24 hour  Intake    800 ml  Output   2750 ml  Net  -1950 ml    LBM: Last BM Date: 11/16/15 Baseline Weight: Weight: 45.36 kg (100 lb) Most recent weight: Weight: 45.6 kg (100 lb 8.5 oz)      Palliative Assessment/Data:  Flowsheet Rows        Most Recent Value   Intake Tab    Referral Department  Hospitalist   Unit at Time of Referral  Med/Surg Unit   Palliative Care Primary Diagnosis  Sepsis/Infectious Disease   Date Notified  11/17/15   Palliative Care Type  New Palliative care   Reason for referral  Clarify Goals of Care   Date of Admission  10/29/2015   Date first seen by Palliative Care  11/19/15   # of days IP prior to Palliative referral  4   Clinical Assessment    Palliative Performance Scale Score  20%   Pain Max last 24 hours  6   Pain  Min Last 24 hours  5   Dyspnea Max Last 24 Hours  5   Dyspnea Min Last 24 hours  4   Psychosocial & Spiritual Assessment    Palliative Care Outcomes    Patient/Family meeting held?  Yes   Who was at the meeting?  Family meeting with patient's daughter Lewanda Rife over the phone.   Palliative Care Outcomes  Clarified goals of care   Palliative Care follow-up planned  Yes, Facility      Additional Data Reviewed:  CBC:    Component Value Date/Time   WBC 15.4* 11/18/2015 0815   HGB 9.7* 11/18/2015 0815   HCT 28.7* 11/18/2015 0815   PLT 346 11/18/2015 0815   MCV 82.5 11/18/2015 0815   NEUTROABS 13.8* 11/17/2015 0903   LYMPHSABS 1.2 11/17/2015 0903   MONOABS 0.8 11/17/2015 0903   EOSABS 0.0 11/17/2015 0903   BASOSABS 0.0 11/17/2015 0903   Comprehensive Metabolic Panel:    Component Value Date/Time   NA 144 11/18/2015 0815   K 3.0* 11/18/2015 0815   CL 109 11/18/2015 0815   CO2 22 11/18/2015 0815   BUN 26* 11/18/2015 0815   CREATININE 1.16* 11/18/2015 0815   GLUCOSE 72 11/18/2015 0815   CALCIUM 9.2 11/18/2015 0815   CALCIUM 11.3* 01/24/2011 1110   AST 15 11/18/2015 0815   ALT 13* 11/18/2015 0815   ALKPHOS 49 11/18/2015 0815   BILITOT 0.9 11/18/2015 0815   PROT 5.3* 11/18/2015 0815   ALBUMIN 1.6* 11/18/2015 0815     Time In: 9:30 Time Out: 10:30 Time Total: 60 min  Greater than 50%  of this time was spent counseling and coordinating care related to the above assessment and plan.  Signed by: Loistine Chance, MD [phone] Loistine Chance, MD  11/19/2015, 10:53 AM  Please contact Palliative Medicine Team phone at [phone] for questions and concerns.

## 2015-11-19 NOTE — Progress Notes (Signed)
Nutrition Follow-up  DOCUMENTATION CODES:   Severe malnutrition in context of chronic illness, Underweight  INTERVENTION:  Continue Ensure Enlive po TID, each supplement provides 350 kcal and 20 grams of protein.  Provide 30 ml Prostat po once daily, each supplement provides 100 kcal and 15 grams of protein.    Encourage adequate PO intake.   NUTRITION DIAGNOSIS:   Malnutrition related to chronic illness as evidenced by severe depletion of body fat, severe depletion of muscle mass; ongoing  GOAL:   Patient will meet greater than or equal to 90% of their needs; not met  MONITOR:   PO intake, Supplement acceptance, Weight trends, Labs, I & O's, Diet advancement, Skin  REASON FOR ASSESSMENT:   Malnutrition Screening Tool    ASSESSMENT:   82 y.o. female with PMH of asthma, hypertension, dementia, anxiety, and chronic kidney disease stage III who presents to the ED with progressively worsening generalized weakness and lethargy. Pt with sepsis secondary to necrotizing PNA and found to have a rash opn the L upper thigh c/w shingles  Meal completion has been very poor. Per MD note, pt with very poor prognosis. Palliative has been consulted and goals of care to be finalized later today or tomorrow. RD to continue with Ensure and additionally order Prostat to aid in caloric and protein needs as intake has been poor.   Labs and medications reviewed.   Diet Order:  DIET - DYS 1 Room service appropriate?: Yes; Fluid consistency:: Thin  Skin:  Wound (see comment) (Unstg ulcer L hip and coccyx, stg 2 R knee and hip,DTI L leg)  Last BM:  2/22  Height:   Ht Readings from Last 1 Encounters:  11/16/15 5' 6.93" (1.7 m)    Weight:   Wt Readings from Last 1 Encounters:  11/18/15 100 lb 8.5 oz (45.6 kg)    Ideal Body Weight:  59 kg  BMI:  Body mass index is 15.78 kg/(m^2).  Estimated Nutritional Needs:   Kcal:  1500-1700  Protein:  65-75 grams  Fluid:  >/= 1.5  L/day  EDUCATION NEEDS:   No education needs identified at this time  Corrin Parker, MS, RD, LDN Pager # [phone] After hours/ weekend pager # [phone]

## 2015-11-19 NOTE — Progress Notes (Addendum)
[facility] PROGRESS NOTE  Ashley Norman [id number] DOB: 1928-09-06 DOA: 11/06/2015 PCP: Noralee Space, MD  brief narrative 82 year old female with progressive dementia with failure to thrive, asthma, hypertension, diabetes mellitus, who was admitted to the hospital/akinesis history 30 with progressive decline infarction with deconditioning, being poorly interactive, cardiac on presentation and was found to have a large chest soft tissue density with cavitation over her left hemithorax. She was septic with tachycardia, and obesity of 18 K, lactic acid of 2.7. Also had a rash over her rt upper thigh concerning for shingles. Admitted to hospital service for sepsis due to organizing pneumonia. Patient showing poor improvement in overall symptoms. Discussed with family and palliative care consulted for goals of care discussion.   Assessment/Plan: Sepsis secondary to necrotizing pneumonia Concern for aspiration. Initially started on vancomycin and Levaquin and switch to endomysial. Patient afebrile but showing poor overall response to treatment. Blood cultures have been negative. She is severely deconditioned and contracted. Has very poor prognosis.  Palliative care discussing with family on goals of care. Recommending residential hospice. Will have final answer from family by later today or tomorrow.  ? Shingles over right thigh On empiric acyclovir.  Hypertension Holding lisinopril given acute kidney injury on presentation. Blood pressure stable.  History of carotid plaque Continue aspirin.  History of diabetes mellitus with hypoglycemia  due to poor by mouth intake.  resumed sensitive sliding scale coverage.   Hypothyroidism Continue Synthroid.  Hypokalemia Replenished  Multiple decubitus pressure ulcers Appreciate wound care follow-up   DVT prophylaxis: Subcutaneous heparin Diet: Dysphagia level I   Code Status: DO NOT RESUSCITATE Family Communication: Discussed  with son Dominica Severin on the phone on 2/22.  Disposition Plan: Pending final  Discussion by palliative care with family. Her severe deconditioning and poor improvement she will likely need residential hospice.   Consultants:  Palliative care  Procedures:  None  Antibiotics:  ILevaquin 750 2/18-->2/20  Vancomycin 2/18-->2/20  Clindamycin 2/20------  HPI/Subjective: Seen and and examined. Was hypoxic requiring NRB briefly yesterday,. tmax of 100.49F  Objective: Filed Vitals:   11/19/15 0350 11/19/15 0929  BP: 110/58 115/50  Pulse: 93 88  Temp: 98.4 F (36.9 C) 100.8 F (38.2 C)  Resp: 22 18    Intake/Output Summary (Last 24 hours) at 11/19/15 1318 Last data filed at 11/19/15 0946  Gross per 24 hour  Intake    770 ml  Output   2750 ml  Net  -1980 ml   Filed Weights   11/15/15 2124 11/16/15 2147 11/18/15 2116  Weight: 45.4 kg (100 lb 1.4 oz) 45.5 kg (100 lb 5 oz) 45.6 kg (100 lb 8.5 oz)    Exam:   General:  Elderly female lying in bed in no acute distress, poorly communicative  HEENT: , dry mucosa  Chest: Poor respiratory effort bilaterally, no added sounds  CVS: Normal S1 and S2: Normal color gallop  GI: Soft, nondistended, nontender, bowel sounds present  Musculoskeletal: severely Contracted, shingles over rt thigh  CNS: alert and awake, poorly communicative, oriented to self only  Data Reviewed: Basic Metabolic Panel:  Recent Labs Lab 11/05/2015 2112 11/14/15 0259 11/15/15 0859 11/17/15 0903 11/18/15 0815  NA 142 142 138 144 144  K 4.5 4.0 3.7 3.3* 3.0*  CL 98* 106 104 109 109  CO2 30 24 23 24 22   GLUCOSE 184* 107* 81 81 72  BUN 52* 45* 36* 26* 26*  CREATININE 1.37* 1.04* 1.06* 1.15* 1.16*  CALCIUM 10.7* 9.2 8.8* 9.2 9.2  Liver Function Tests:  Recent Labs Lab 11/07/2015 2112 11/17/15 0903 11/18/15 0815  AST 24 19 15   ALT 21 12* 13*  ALKPHOS 77 54 49  BILITOT 0.7 1.1 0.9  PROT 7.4 5.6* 5.3*  ALBUMIN 2.2* 1.7* 1.6*   No results for  input(s): LIPASE, AMYLASE in the last 168 hours. No results for input(s): AMMONIA in the last 168 hours. CBC:  Recent Labs Lab 11/10/2015 2112 11/14/15 0259 11/15/15 0859 11/17/15 0903 11/18/15 0815  WBC 18.4* 24.2* 17.4* 15.8* 15.4*  NEUTROABS 15.4* 21.7* 15.3* 13.8*  --   HGB 13.8 9.6* 10.9* 10.6* 9.7*  HCT 41.0 29.4* 33.3* 32.7* 28.7*  MCV 83.8 84.5 84.5 81.5 82.5  PLT 452* 391 353 376 346   Cardiac Enzymes: No results for input(s): CKTOTAL, CKMB, CKMBINDEX, TROPONINI in the last 168 hours. BNP (last 3 results) No results for input(s): BNP in the last 8760 hours.  ProBNP (last 3 results) No results for input(s): PROBNP in the last 8760 hours.  CBG:  Recent Labs Lab 11/18/15 2058 11/19/15 0016 11/19/15 0421 11/19/15 0927 11/19/15 1218  GLUCAP 135* 107* 77 91 82    Recent Results (from the past 240 hour(s))  Blood Culture (routine x 2)     Status: None   Collection Time: 10/31/2015  9:11 PM  Result Value Ref Range Status   Specimen Description BLOOD LEFT ARM  Final   Special Requests BOTTLES DRAWN AEROBIC AND ANAEROBIC 5ML  Final   Culture NO GROWTH 5 DAYS  Final   Report Status 11/18/2015 FINAL  Final  Blood Culture (routine x 2)     Status: None   Collection Time: 11/11/2015  9:23 PM  Result Value Ref Range Status   Specimen Description BLOOD RIGHT FOREARM  Final   Special Requests IN PEDIATRIC BOTTLE 1ML  Final   Culture NO GROWTH 5 DAYS  Final   Report Status 11/18/2015 FINAL  Final  Urine culture     Status: None (Preliminary result)   Collection Time: 11/18/15  7:36 PM  Result Value Ref Range Status   Specimen Description URINE, CATHETERIZED  Final   Special Requests NONE  Final   Culture NO GROWTH < 24 HOURS  Final   Report Status PENDING  Incomplete     Studies: No results found.  Scheduled Meds: . acyclovir  450 mg Intravenous Q24H  . antiseptic oral rinse  7 mL Mouth Rinse BID  . budesonide  0.5 mg Nebulization BID  . clindamycin (CLEOCIN) IV   300 mg Intravenous 3 times per day  . collagenase   Topical Daily  . feeding supplement (ENSURE ENLIVE)  237 mL Oral TID BM  . heparin  5,000 Units Subcutaneous 3 times per day  . insulin aspart  0-9 Units Subcutaneous TID WC  . tiotropium  18 mcg Inhalation Daily   Continuous Infusions: . sodium chloride        Time spent: 25 minutes    Jordan Caraveo, [place]  [facility] Pager [phone]. If 7PM-7AM, please contact night-coverage at [email or web address], password [facility] 11/19/2015, 1:18 PM  LOS: 6 days

## 2015-11-19 NOTE — Progress Notes (Signed)
When RT arrived for pt's scheduled neb tx, [place] was on pt's forehead, and SpO2 was 88%. [place] placed back in pt's nose. Pt's meds were scanned and given, but pt only kept aerosol mask on for about a minute and refused to put it back on. Pt appeared to be in no obvious respiratory distress at that time, with clear bbs. RT will continue to monitor.

## 2015-11-19 NOTE — Progress Notes (Signed)
SLP Cancellation Note  Patient Details Name: Ashley Norman MRN: [id number] DOB: 1927-11-10   Cancelled treatment:       Reason Eval/Treat Not Completed: Fatigue/lethargy limiting ability to participate   Toye Rouillard,PAT, M.S., CCC-SLP 11/19/2015, 2:59 PM

## 2015-11-19 NOTE — Progress Notes (Signed)
Levalbuterol .63mg  QID changed to PRN. Pt seems to be more annoyed with tx/mask and takes off when trying to administer. Will give PRN if indicated. RT will continue to monitor.

## 2015-11-20 DIAGNOSIS — R5381 Other malaise: Secondary | ICD-10-CM

## 2015-11-20 LAB — GLUCOSE, CAPILLARY
GLUCOSE-CAPILLARY: 72 mg/dL (ref 65–99)
GLUCOSE-CAPILLARY: 81 mg/dL (ref 65–99)
GLUCOSE-CAPILLARY: 87 mg/dL (ref 65–99)
Glucose-Capillary: 75 mg/dL (ref 65–99)
Glucose-Capillary: 76 mg/dL (ref 65–99)
Glucose-Capillary: 76 mg/dL (ref 65–99)

## 2015-11-20 NOTE — Progress Notes (Signed)
11/20/2015 5:56 PM  Offered again to reposition patient, to which she declined.  She asked for some water--drank a few sips but would not eat her dinner.  She denied being in any pain, asked where her daughter went.  Pt appears comfortable at this time but when communicating sounds withdrawn and sad.    Ashley Norman

## 2015-11-20 NOTE — Progress Notes (Signed)
11/20/2015  7:01 PM  Pt had what appears to have been 8 beat run of vtach during an episode of SVT.  Dr. Clementeen Graham notified.  Will monitor. Ashley Norman

## 2015-11-20 NOTE — Progress Notes (Signed)
[facility] PROGRESS NOTE  Ashley Norman [id number] DOB: [birth year] DOA: 10/30/2015 PCP: Noralee Space, MD  brief narrative 82 year old female with progressive dementia with failure to thrive, asthma, hypertension, diabetes mellitus,CKD III,  who was admitted to the hospital with  progressive decline in function with deconditioning, being poorly interactive,  found to have a large chest soft tissue density with cavitation over her left hemithorax. She was septic with tachycardia, WBC 18 K, lactic acid of 2.7. Also had a rash over her rt upper thigh concerning for shingles. Admitted to hospital service for sepsis due to organizing pneumonia. Patient showing poor improvement in overall symptoms. Discussed with family and palliative care consulted for goals of care discussion.   Assessment/Plan: Sepsis secondary to necrotizing pneumonia Concern for aspiration. Initially started on vancomycin and Levaquin and switch to endomysial. Patient afebrile but showing poor overall response to treatment. Blood cultures have been negative. She is severely deconditioned and contracted. Has very poor prognosis.  Palliative care discussing with family on goals of care. Recommending residential hospice. Discussed with daughter again on the phone today regarding goals of care. She is trying gather all her siblings (82 of them) and have a mutual decision. Ive asked her if we  can have a meeting with everyone present sometime tomorrow.   ? Shingles over right thigh On empiric acyclovir.  Hypertension Holding lisinopril given acute kidney injury on presentation. Blood pressure stable.  History of carotid plaque Continue aspirin.  History of diabetes mellitus with hypoglycemia  due to poor by mouth intake.  resumed sensitive sliding scale coverage.   Hypothyroidism Continue Synthroid.  Hypokalemia Replenished  Multiple decubitus pressure ulcers Appreciate wound care follow-up   DVT  prophylaxis: Subcutaneous heparin Diet: Dysphagia level I   Code Status: DO NOT RESUSCITATE Family Communication: Discussed with daughter Butch Penny on the phone  Disposition Plan: Pending final  Discussion by palliative care with family. Her severe deconditioning and poor improvement she will likely need residential vs home hospice   Consultants:  Palliative care  Procedures:  None  Antibiotics:  ILevaquin 750 2/18-->2/20  Vancomycin 2/18-->2/20  Clindamycin 2/20------  HPI/Subjective: Seen and and examined. Was hypoxic requiring NRB briefly yesterday,. tmax of 100.54F  Objective: Filed Vitals:   11/20/15 0436 11/20/15 0837  BP: 127/69 112/55  Pulse: 62 91  Temp: 98.6 F (37 C) 98.5 F (36.9 C)  Resp: 18 18    Intake/Output Summary (Last 24 hours) at 11/20/15 1254 Last data filed at 11/20/15 1100  Gross per 24 hour  Intake    920 ml  Output    700 ml  Net    220 ml   Filed Weights   11/15/15 2124 11/16/15 2147 11/18/15 2116  Weight: 45.4 kg (100 lb 1.4 oz) 45.5 kg (100 lb 5 oz) 45.6 kg (100 lb 8.5 oz)    Exam:   General:  Fatigued, poorly communicative  HEENT: , dry mucosa  Chest: Poor respiratory effort bilaterally, no added sounds  CVS: Normal S1 and S2: Normal color gallop  GI: Soft, nondistended, nontender, bowel sounds present  Musculoskeletal: severely Contracted, shingles over rt thigh  CNS:  poorly communicative,   Data Reviewed: Basic Metabolic Panel:  Recent Labs Lab 11/23/2015 2112 11/14/15 0259 11/15/15 0859 11/17/15 0903 11/18/15 0815  NA 142 142 138 144 144  K 4.5 4.0 3.7 3.3* 3.0*  CL 98* 106 104 109 109  CO2 30 24 23 24 22   GLUCOSE 184* 107* 81 81 72  BUN 52*  45* 36* 26* 26*  CREATININE 1.37* 1.04* 1.06* 1.15* 1.16*  CALCIUM 10.7* 9.2 8.8* 9.2 9.2   Liver Function Tests:  Recent Labs Lab 11/11/2015 2112 11/17/15 0903 11/18/15 0815  AST 24 19 15   ALT 21 12* 13*  ALKPHOS 77 54 49  BILITOT 0.7 1.1 0.9  PROT 7.4 5.6*  5.3*  ALBUMIN 2.2* 1.7* 1.6*   No results for input(s): LIPASE, AMYLASE in the last 168 hours. No results for input(s): AMMONIA in the last 168 hours. CBC:  Recent Labs Lab 11/21/2015 2112 11/14/15 0259 11/15/15 0859 11/17/15 0903 11/18/15 0815  WBC 18.4* 24.2* 17.4* 15.8* 15.4*  NEUTROABS 15.4* 21.7* 15.3* 13.8*  --   HGB 13.8 9.6* 10.9* 10.6* 9.7*  HCT 41.0 29.4* 33.3* 32.7* 28.7*  MCV 83.8 84.5 84.5 81.5 82.5  PLT 452* 391 353 376 346   Cardiac Enzymes: No results for input(s): CKTOTAL, CKMB, CKMBINDEX, TROPONINI in the last 168 hours. BNP (last 3 results) No results for input(s): BNP in the last 8760 hours.  ProBNP (last 3 results) No results for input(s): PROBNP in the last 8760 hours.  CBG:  Recent Labs Lab 11/19/15 1951 11/20/15 0009 11/20/15 0432 11/20/15 0842 11/20/15 1150  GLUCAP 85 81 72 75 76    Recent Results (from the past 240 hour(s))  Blood Culture (routine x 2)     Status: None   Collection Time: 11/08/2015  9:11 PM  Result Value Ref Range Status   Specimen Description BLOOD LEFT ARM  Final   Special Requests BOTTLES DRAWN AEROBIC AND ANAEROBIC 5ML  Final   Culture NO GROWTH 5 DAYS  Final   Report Status 11/18/2015 FINAL  Final  Blood Culture (routine x 2)     Status: None   Collection Time: 11/10/2015  9:23 PM  Result Value Ref Range Status   Specimen Description BLOOD RIGHT FOREARM  Final   Special Requests IN PEDIATRIC BOTTLE 1ML  Final   Culture NO GROWTH 5 DAYS  Final   Report Status 11/18/2015 FINAL  Final  Urine culture     Status: None   Collection Time: 11/18/15  7:36 PM  Result Value Ref Range Status   Specimen Description URINE, CATHETERIZED  Final   Special Requests NONE  Final   Culture NO GROWTH 1 DAY  Final   Report Status 11/19/2015 FINAL  Final     Studies: No results found.  Scheduled Meds: . acyclovir  450 mg Intravenous Q24H  . antiseptic oral rinse  7 mL Mouth Rinse BID  . budesonide  0.5 mg Nebulization BID  .  clindamycin (CLEOCIN) IV  300 mg Intravenous 3 times per day  . collagenase   Topical Daily  . feeding supplement (ENSURE ENLIVE)  237 mL Oral TID BM  . feeding supplement (PRO-STAT SUGAR FREE 64)  30 mL Oral Daily  . heparin  5,000 Units Subcutaneous 3 times per day  . insulin aspart  0-9 Units Subcutaneous TID WC  . tiotropium  18 mcg Inhalation Daily   Continuous Infusions: . sodium chloride 50 mL/hr at 11/20/15 1210      Time spent: 25 minutes    Atleigh Gruen, [place]  [facility] Pager [phone]. If 7PM-7AM, please contact night-coverage at [email or web address], password [facility] 11/20/2015, 12:54 PM  LOS: 7 days

## 2015-11-20 NOTE — Progress Notes (Signed)
Pharmacy Antibiotic Note  Ashley Norman is a 82 y.o. female admitted on 11/10/2015 with general decline.  Pharmacy has been consulted for acyclovir dosing for suspected shingles.  Plan: This patient's current antibiotics will be continued without adjustments.- Acyclovir 10 mg/kg (450 mg) IV q24h x 7 days - last dose due 2/25 - Clindamycin 300mg  PO q8h per MD- last dose due 3/4 - stop dates as above are entered. Pharmacy to sign off.  Height: 5' 6.93" (170 cm) (historic height) Weight: 100 lb 8.5 oz (45.6 kg) IBW/kg (Calculated) : 61.44    Temp (24hrs), Avg:98.4 F (36.9 C), Min:98.2 F (36.8 C), Max:98.6 F (37 C)   Recent Labs Lab 10/28/2015 2112 11/16/2015 2128 11/14/15 0029 11/14/15 0259 11/15/15 0859 11/17/15 0903 11/18/15 0815  WBC 18.4*  --   --  24.2* 17.4* 15.8* 15.4*  CREATININE 1.37*  --   --  1.04* 1.06* 1.15* 1.16*  LATICACIDVEN  --  2.75* 2.45*  --   --   --   --     Estimated Creatinine Clearance: 25.5 mL/min (by C-G formula based on Cr of 1.16).    Allergies  Allergen Reactions  . Amoxicillin-Pot Clavulanate     REACTION: rash, itching  . Cephalexin     REACTION: rash, itching  . Codeine     REACTION: rash, itching    Antimicrobials this admission:  Vanc 2/17 >>2/20 Levaquin 2/17 >>2/20 Acyclovir 2/19 >> (2/25) Clinda 2/20>>(3/4)  Dose adjustments this admission: N/A  Microbiology results:  2/17 Blood cx: neg 2/17 quantiferon tb gold: indeterminate 2/18 aspergillus antibody: antibody not detected 2/18 cryptococcal ag: neg 2/22 strep pneumo: neg 2/22 legionella: neg 2/22 urine: neg  Thank you for allowing pharmacy to be a part of this patient's care.  Dylin Ihnen D. Aneesah Hernan, PharmD, BCPS Clinical Pharmacist Pager: [phone] 11/20/2015 11:30 AM

## 2015-11-20 NOTE — Progress Notes (Signed)
11/20/2015 2:19 PM  Thus far today patient has refused all turns, will not let me change her dressings, and has not taken PO medications.  When attempting to turn patient, she yells out in pain and demands you to stop.  I attempted to change her dressings but she would not let me look at the [facility] didn't want to move for them to be seen.  Pt has been left side lying all day but prefers to lay that way for comfort.  IV meds have been administered per order.  Pt has also eaten very little.  Daughter came in briefly--this RN updated her on patient status and events of today.  Will continue to monitor patient.  Ashley Norman

## 2015-11-21 DIAGNOSIS — I472 Ventricular tachycardia: Secondary | ICD-10-CM

## 2015-11-21 LAB — BASIC METABOLIC PANEL
Anion gap: 9 (ref 5–15)
BUN: 20 mg/dL (ref 6–20)
CHLORIDE: 105 mmol/L (ref 101–111)
CO2: 27 mmol/L (ref 22–32)
CREATININE: 0.9 mg/dL (ref 0.44–1.00)
Calcium: 9 mg/dL (ref 8.9–10.3)
GFR calc Af Amer: >60 mL/min (ref >60)
GFR calc non Af Amer: 57 mL/min — ABNORMAL LOW (ref >60)
GLUCOSE: 131 mg/dL — AB (ref 65–99)
POTASSIUM: 3.3 mmol/L — AB (ref 3.5–5.1)
Sodium: 141 mmol/L (ref 135–145)

## 2015-11-21 LAB — GLUCOSE, CAPILLARY
GLUCOSE-CAPILLARY: 152 mg/dL — AB (ref 65–99)
GLUCOSE-CAPILLARY: 106 mg/dL — AB (ref 65–99)
Glucose-Capillary: 118 mg/dL — ABNORMAL HIGH (ref 65–99)
Glucose-Capillary: 103 mg/dL — ABNORMAL HIGH (ref 65–99)
Glucose-Capillary: 98 mg/dL (ref 65–99)

## 2015-11-21 LAB — MAGNESIUM: MAGNESIUM: 1.3 mg/dL — AB (ref 1.7–2.4)

## 2015-11-21 NOTE — Progress Notes (Signed)
11/21/2015 11:28 AM  Pt still refusing to turn, will not let me touch her dressings, and cries out in pain when you move her slightly to do Foley care.  Pt is not eating, will only take a few sips of Ensure here and there.  Pt does not appear to be in any pain at rest, states she is comfortable the way she is.  Keeps asking where her family is at and when they are coming.  Will continue to monitor patient. Princella Pellegrini

## 2015-11-21 NOTE — Progress Notes (Signed)
[facility] PROGRESS NOTE  Ashley Norman [id number] DOB: [birth year] DOA: 10/30/2015 PCP: Noralee Space, MD  brief narrative 82 year old female with progressive dementia with failure to thrive, asthma, hypertension, diabetes mellitus,CKD III,  who was admitted to the hospital with  progressive decline in function with deconditioning, being poorly interactive,  found to have a large chest soft tissue density with cavitation over her left hemithorax. She was septic with tachycardia, WBC 18 K, lactic acid of 2.7. Also had a rash over her rt upper thigh concerning for shingles. Admitted to hospital service for sepsis due to organizing pneumonia. Patient showing poor improvement in overall symptoms. Discussed with family and palliative care consulted for goals of care discussion.   Assessment/Plan: Sepsis secondary to necrotizing pneumonia Concern for aspiration. Initially started on vancomycin and Levaquin and switch to clindamycin. poor overall response to treatment. Blood cultures have been negative. She is severely deconditioned and contracted. Has very poor prognosis.  Palliative care discussing with family on goals of care. Recommending residential hospice. Discussed with daughter again on the phone today regarding goals of care. She is trying gather all her siblings (44 of them) and have a mutual decision. Ive asked her if we  can have a meeting with everyone during the weekend. havent heard anything back.   ? Shingles over right thigh On empiric acyclovir.  Hypertension Holding lisinopril given acute kidney injury on presentation. Blood pressure stable.  History of carotid plaque Continue aspirin.  History of diabetes mellitus with hypoglycemia  due to poor by mouth intake.  resumed sensitive sliding scale coverage.   Hypothyroidism Continue Synthroid.  Hypokalemia Replenished  Multiple decubitus pressure ulcers Appreciate wound care follow-up  NSVT  Check mg and  k in am   DVT prophylaxis: Subcutaneous heparin Diet: Dysphagia level I   Code Status: DO NOT RESUSCITATE Family Communication: Discussed with daughter Butch Penny on the phone on 2/24  Disposition Plan: Pending final  Discussion by palliative care with family. Her severe deconditioning and poor improvement she will likely need residential vs home hospice   Consultants:  Palliative care  Procedures:  None  Antibiotics:  ILevaquin 750 2/18-->2/20  Vancomycin 2/18-->2/20  Clindamycin 2/20------  HPI/Subjective: Seen and and examined. Poor po intake  Objective: Filed Vitals:   11/21/15 0428 11/21/15 0812  BP: 143/64 127/61  Pulse: 92 83  Temp: 98.4 F (36.9 C) 98 F (36.7 C)  Resp: 16 18    Intake/Output Summary (Last 24 hours) at 11/21/15 1343 Last data filed at 11/21/15 1136  Gross per 24 hour  Intake 1621.67 ml  Output    500 ml  Net 1121.67 ml   Filed Weights   11/15/15 2124 11/16/15 2147 11/18/15 2116  Weight: 45.4 kg (100 lb 1.4 oz) 45.5 kg (100 lb 5 oz) 45.6 kg (100 lb 8.5 oz)    Exam:   General:  Fatigued, poorly communicative  HEENT: , dry mucosa  Chest: no added sounds  CVS: Normal S1 and S2  GI: Soft, nondistended, nontender, bowel sounds present  Musculoskeletal: severely Contracted, shingles over rt thigh, foley+  CNS:  poorly communicative,   Data Reviewed: Basic Metabolic Panel:  Recent Labs Lab 11/15/15 0859 11/17/15 0903 11/18/15 0815  NA 138 144 144  K 3.7 3.3* 3.0*  CL 104 109 109  CO2 23 24 22   GLUCOSE 81 81 72  BUN 36* 26* 26*  CREATININE 1.06* 1.15* 1.16*  CALCIUM 8.8* 9.2 9.2   Liver Function Tests:  Recent Labs Lab  11/17/15 0903 11/18/15 0815  AST 19 15  ALT 12* 13*  ALKPHOS 54 49  BILITOT 1.1 0.9  PROT 5.6* 5.3*  ALBUMIN 1.7* 1.6*   No results for input(s): LIPASE, AMYLASE in the last 168 hours. No results for input(s): AMMONIA in the last 168 hours. CBC:  Recent Labs Lab 11/15/15 0859  11/17/15 0903 11/18/15 0815  WBC 17.4* 15.8* 15.4*  NEUTROABS 15.3* 13.8*  --   HGB 10.9* 10.6* 9.7*  HCT 33.3* 32.7* 28.7*  MCV 84.5 81.5 82.5  PLT 353 376 346   Cardiac Enzymes: No results for input(s): CKTOTAL, CKMB, CKMBINDEX, TROPONINI in the last 168 hours. BNP (last 3 results) No results for input(s): BNP in the last 8760 hours.  ProBNP (last 3 results) No results for input(s): PROBNP in the last 8760 hours.  CBG:  Recent Labs Lab 11/20/15 1954 11/21/15 0101 11/21/15 0419 11/21/15 0810 11/21/15 1149  GLUCAP 76 152* 106* 118* 103*    Recent Results (from the past 240 hour(s))  Blood Culture (routine x 2)     Status: None   Collection Time: 11/18/2015  9:11 PM  Result Value Ref Range Status   Specimen Description BLOOD LEFT ARM  Final   Special Requests BOTTLES DRAWN AEROBIC AND ANAEROBIC 5ML  Final   Culture NO GROWTH 5 DAYS  Final   Report Status 11/18/2015 FINAL  Final  Blood Culture (routine x 2)     Status: None   Collection Time: 11/12/2015  9:23 PM  Result Value Ref Range Status   Specimen Description BLOOD RIGHT FOREARM  Final   Special Requests IN PEDIATRIC BOTTLE 1ML  Final   Culture NO GROWTH 5 DAYS  Final   Report Status 11/18/2015 FINAL  Final  Urine culture     Status: None   Collection Time: 11/18/15  7:36 PM  Result Value Ref Range Status   Specimen Description URINE, CATHETERIZED  Final   Special Requests NONE  Final   Culture NO GROWTH 1 DAY  Final   Report Status 11/19/2015 FINAL  Final     Studies: No results found.  Scheduled Meds: . antiseptic oral rinse  7 mL Mouth Rinse BID  . budesonide  0.5 mg Nebulization BID  . clindamycin (CLEOCIN) IV  300 mg Intravenous 3 times per day  . collagenase   Topical Daily  . feeding supplement (ENSURE ENLIVE)  237 mL Oral TID BM  . feeding supplement (PRO-STAT SUGAR FREE 64)  30 mL Oral Daily  . heparin  5,000 Units Subcutaneous 3 times per day  . insulin aspart  0-9 Units Subcutaneous TID WC   . tiotropium  18 mcg Inhalation Daily   Continuous Infusions: . sodium chloride 50 mL/hr at 11/21/15 1150      Time spent: 20 minutes    Apoorva Bugay, [place]  [facility] Pager [phone]. If 7PM-7AM, please contact night-coverage at [email or web address], password [facility] 11/21/2015, 1:43 PM  LOS: 8 days

## 2015-11-22 DIAGNOSIS — J850 Gangrene and necrosis of lung: Secondary | ICD-10-CM

## 2015-11-25 NOTE — Progress Notes (Addendum)
Pt found to be unresponsive with no respirations, no pulse with pupils fixed and dilated. Cooling of the body noted to head. Pronounced by two RNs, Rockie Neighbours, RN and Earleen Reaper, RN. Page to on-call K. Sophia for notification, awaiting call back. Dorthey Sawyer, RN

## 2015-11-25 NOTE — Discharge Summary (Signed)
Physician Discharge Summary  Ashley Norman [id number] DOB: 03/20/1928 DOA: 11/01/2015  PCP: Noralee Space, MD  Admit date: 10/28/2015 Discharge date: 21-Dec-2015  Time spent: 25 minutes  Patient expired on December 21, 2015 ay 6:14 am  Discharge Diagnoses:  Principal Problem:   Sepsis [facility])   Active Problems:   Hypothyroidism   Anxiety   Essential hypertension   Asthma   FTT (failure to thrive) in adult   Borderline diabetes mellitus   CAP (community acquired pneumonia)   CKD (chronic kidney disease), stage III   Pressure ulcer   Protein-calorie malnutrition, severe   Encounter for palliative care   Goals of care, counseling/discussion   Lobar pneumonia ([place])      Filed Weights   11/16/15 2147 11/18/15 2116 12/21/15 0626  Weight: 45.5 kg (100 lb 5 oz) 45.6 kg (100 lb 8.5 oz) 45.6 kg (100 lb 8.5 oz)    History of present illness:  82 year old female with progressive dementia with failure to thrive, asthma, hypertension, diabetes mellitus,CKD III, who was admitted to the hospital with progressive decline in function with deconditioning, being poorly interactive, found to have a large chest soft tissue density with cavitation over her left hemithorax. She was septic with tachycardia, WBC 18 K, lactic acid of 2.7. Also had a rash over her rt upper thigh concerning for shingles. Admitted to hospital service for sepsis due to organizing pneumonia. Patient showing poor improvement in overall symptoms. Discussed with family and palliative care consulted for goals of care discussion.  Hospital Course:  Sepsis secondary to necrotizing pneumonia Concern for aspiration. Initially started on vancomycin and Levaquin and switch to clindamycin. poor overall response to treatment. Blood cultures have been negative. She was  severely deconditioned and contracted, severe malnourished. Had very poor prognosis.  Palliative care discussing with family on goals of care. Recommended  residential hospice. Discussed with son and daughter while pt in the hospital , they were undecided yet and wanted to gather the whole family to come up with a decision. Last 3 days patient was increasingly lethargic and not eating. This morning patient was found unresponsive and pronounced ead at 6:14 am. Family informed and during my visit they were at bedside.   Shingles over right thigh Was treated with  empiric acyclovir.  Hypertension   Severe deconditioning, malnutrition and failure to thrive   History of diabetes mellitus with hypoglycemia   Hypothyroidism   Hypokalemia   Multiple decubitus pressure ulcers    Consultants:  Palliative care  Procedures:  None  Antibiotics:  ILevaquin 750 2/18-->2/20  Vancomycin 2/18-->2/20  Clindamycin 2/20------      Discharge Instructions         Allergies  Allergen Reactions  . Amoxicillin-Pot Clavulanate     REACTION: rash, itching  . Cephalexin     REACTION: rash, itching  . Codeine     REACTION: rash, itching      The results of significant diagnostics from this hospitalization (including imaging, microbiology, ancillary and laboratory) are listed below for reference.    Significant Diagnostic Studies: Dg Chest Port 1 View  11/17/2015  CLINICAL DATA:  Pneumonia EXAM: PORTABLE CHEST 1 VIEW COMPARISON:  11/16/2015 FINDINGS: Continued dense consolidation in the left upper lobe with possible cavitation. Small to moderate left pleural effusion. No focal opacity on the right. Heart is borderline in size. There is hyperinflation of the lungs compatible with COPD. IMPRESSION: No significant change since prior study. Electronically Signed   By: Rolm Baptise M.D.  On: 11/17/2015 08:19   Dg Chest Port 1 View  11/16/2015  CLINICAL DATA:  Aspiration into airway.  Cough today. EXAM: PORTABLE CHEST 1 VIEW COMPARISON:  Prior radiographs 11/15/2015, and 11/17/2015 FINDINGS: Left upper lobe consolidation with  probable cavitary component, with perhaps decreased density peripherally compared to prior. Volume loss in the left hemithorax with probable left pleural effusion and left basilar opacity, unchanged. Patient remains rotated. No new abnormality in the right lung. Rounded soft tissue density projecting over the right lung base may be related to external soft tissue from the patient's arm, however attention to this at follow-up is recommended. Cardiomediastinal contours are unchanged. IMPRESSION: 1. Left upper lobe opacity with apparent cavitation, with perhaps decreased density in the periphery compared to prior exams. Unchanged volume loss in the left lung with probable left pleural effusion. 2. No focal abnormality in the right lung to suggest aspiration. Apparent rounded soft tissue density about the right lung base is likely external and related to patient's arm, however attention to this at follow-up recommended. Electronically Signed   By: Jeb Levering M.D.   On: 11/16/2015 18:32   Dg Chest Port 1 View  11/15/2015  CLINICAL DATA:  Pneumonia.  Subsequent encounter. EXAM: PORTABLE CHEST 1 VIEW COMPARISON:  11/07/2015 FINDINGS: Left upper lobe cavity and consolidation is without significant change from the prior study allowing for differences in positioning and technique. Probable small left pleural effusion. Right lung is hyperexpanded but clear. IMPRESSION: Left upper lobe consolidation and apparent cavity. This is without significant change from the prior exam. No new abnormalities. Electronically Signed   By: Lajean Manes M.D.   On: 11/15/2015 15:21   Dg Chest Portable 1 View  11/03/2015  CLINICAL DATA:  Generalized weakness and lethargy for 1 week. EXAM: PORTABLE CHEST 1 VIEW COMPARISON:  01/04/2013. FINDINGS: Interval large area of increased density and cavitation with irregular walls in the upper half of the left hemithorax. The right lung remains clear and mildly hyperexpanded. The cardiac  silhouette remains borderline enlarged. Diffuse osteopenia. IMPRESSION: 1. Interval large area of soft tissue density and cavitation in the upper half of the left hemithorax. Differential considerations include tuberculosis infection, fungal infection, cavitary pneumonia and cavitary malignancy. 2. Stable mild changes of COPD. Electronically Signed   By: Claudie Revering M.D.   On: 11/04/2015 21:13    Microbiology: Recent Results (from the past 240 hour(s))  Blood Culture (routine x 2)     Status: None   Collection Time: 11/05/2015  9:11 PM  Result Value Ref Range Status   Specimen Description BLOOD LEFT ARM  Final   Special Requests BOTTLES DRAWN AEROBIC AND ANAEROBIC 5ML  Final   Culture NO GROWTH 5 DAYS  Final   Report Status 11/18/2015 FINAL  Final  Blood Culture (routine x 2)     Status: None   Collection Time: 11/19/2015  9:23 PM  Result Value Ref Range Status   Specimen Description BLOOD RIGHT FOREARM  Final   Special Requests IN PEDIATRIC BOTTLE 1ML  Final   Culture NO GROWTH 5 DAYS  Final   Report Status 11/18/2015 FINAL  Final  Urine culture     Status: None   Collection Time: 11/18/15  7:36 PM  Result Value Ref Range Status   Specimen Description URINE, CATHETERIZED  Final   Special Requests NONE  Final   Culture NO GROWTH 1 DAY  Final   Report Status 11/19/2015 FINAL  Final     Labs: Basic Metabolic  Panel:  Recent Labs Lab 11/17/15 0903 11/18/15 0815 11/21/15 1455  NA 144 144 141  K 3.3* 3.0* 3.3*  CL 109 109 105  CO2 24 22 27   GLUCOSE 81 72 131*  BUN 26* 26* 20  CREATININE 1.15* 1.16* 0.90  CALCIUM 9.2 9.2 9.0  MG  --   --  1.3*   Liver Function Tests:  Recent Labs Lab 11/17/15 0903 11/18/15 0815  AST 19 15  ALT 12* 13*  ALKPHOS 54 49  BILITOT 1.1 0.9  PROT 5.6* 5.3*  ALBUMIN 1.7* 1.6*   No results for input(s): LIPASE, AMYLASE in the last 168 hours. No results for input(s): AMMONIA in the last 168 hours. CBC:  Recent Labs Lab 11/17/15 0903  11/18/15 0815  WBC 15.8* 15.4*  NEUTROABS 13.8*  --   HGB 10.6* 9.7*  HCT 32.7* 28.7*  MCV 81.5 82.5  PLT 376 346   Cardiac Enzymes: No results for input(s): CKTOTAL, CKMB, CKMBINDEX, TROPONINI in the last 168 hours. BNP: BNP (last 3 results) No results for input(s): BNP in the last 8760 hours.  ProBNP (last 3 results) No results for input(s): PROBNP in the last 8760 hours.  CBG:  Recent Labs Lab 11/21/15 0101 11/21/15 0419 11/21/15 0810 11/21/15 1149 11/21/15 1712  GLUCAP 152* 106* 118* 103* 98       Signed:  Louellen Molder MD.  [facility] 12/07/15, 9:36 AM

## 2015-11-25 DEATH — deceased

## 2017-04-26 IMAGING — CR DG CHEST 1V PORT
2 series · 2 of 2 positions shown · non-contrast
Comparison: 11/13/2015

CLINICAL DATA: Pneumonia.  Subsequent encounter.

EXAM:
PORTABLE CHEST 1 VIEW

[AP (1 of 2)]
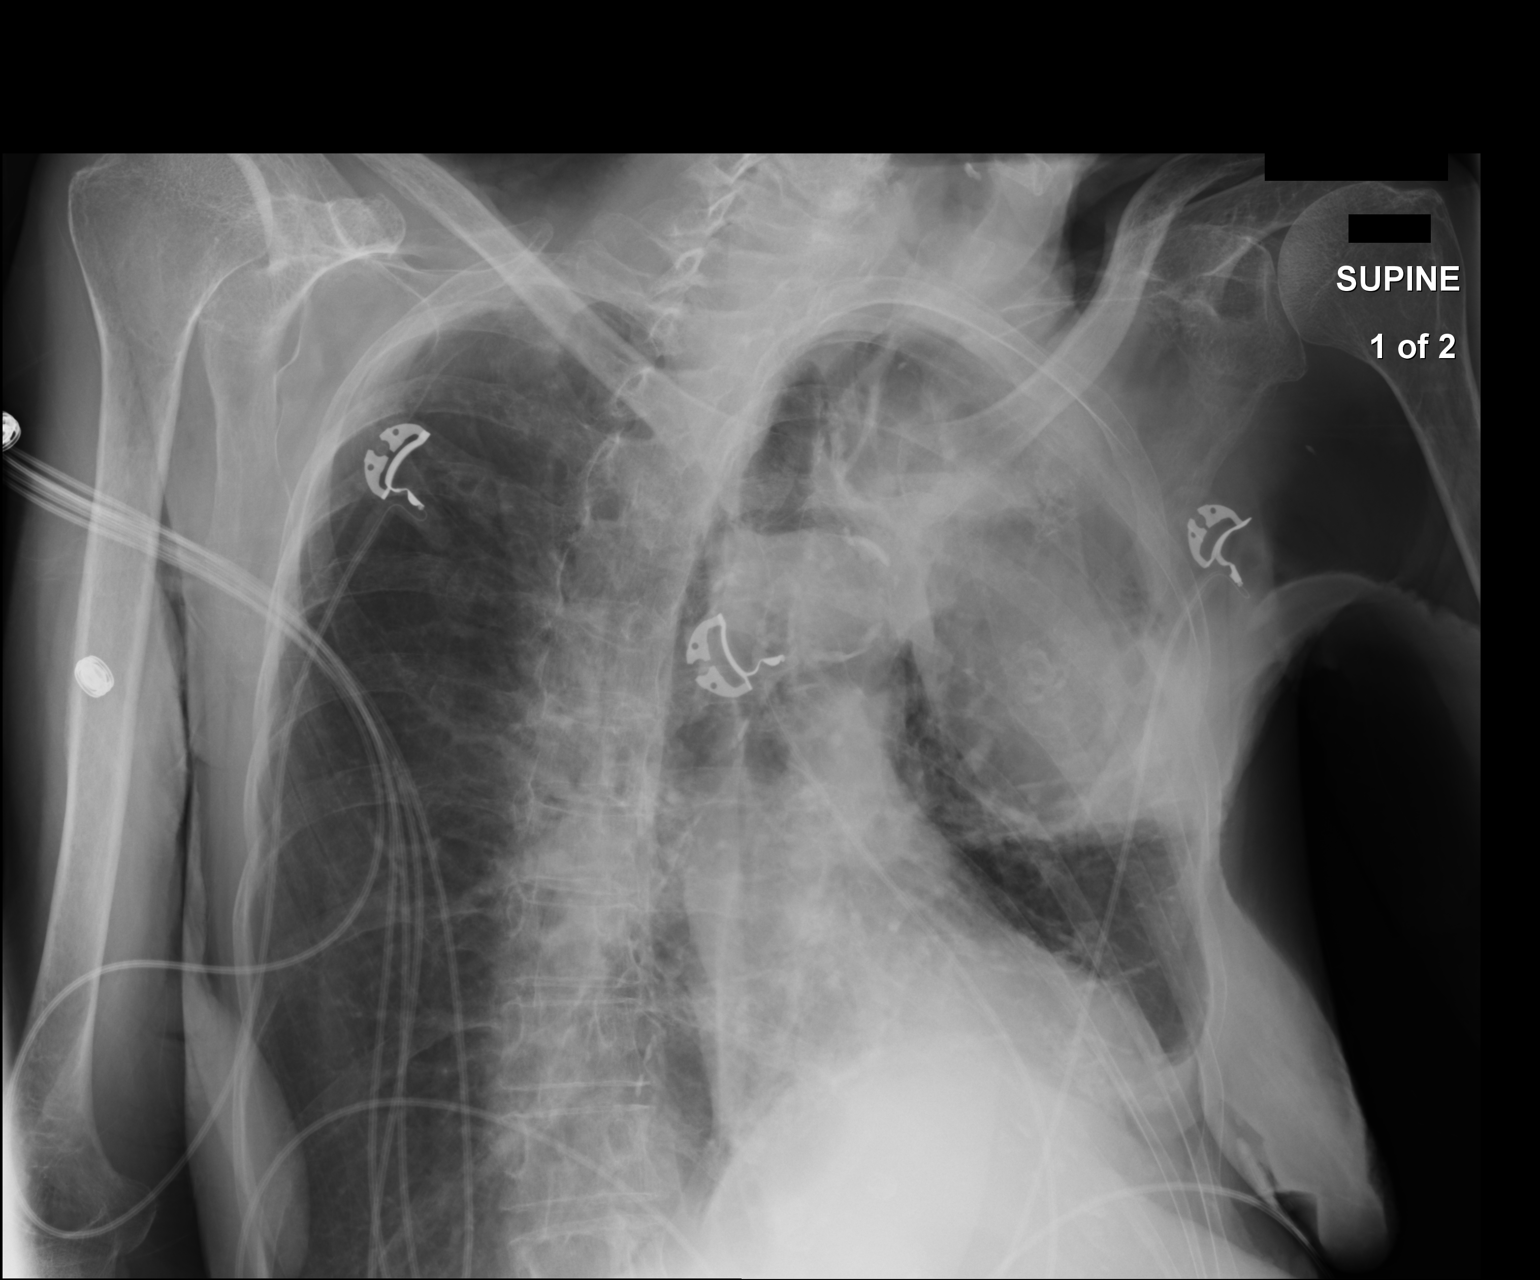

[AP (2 of 2)]
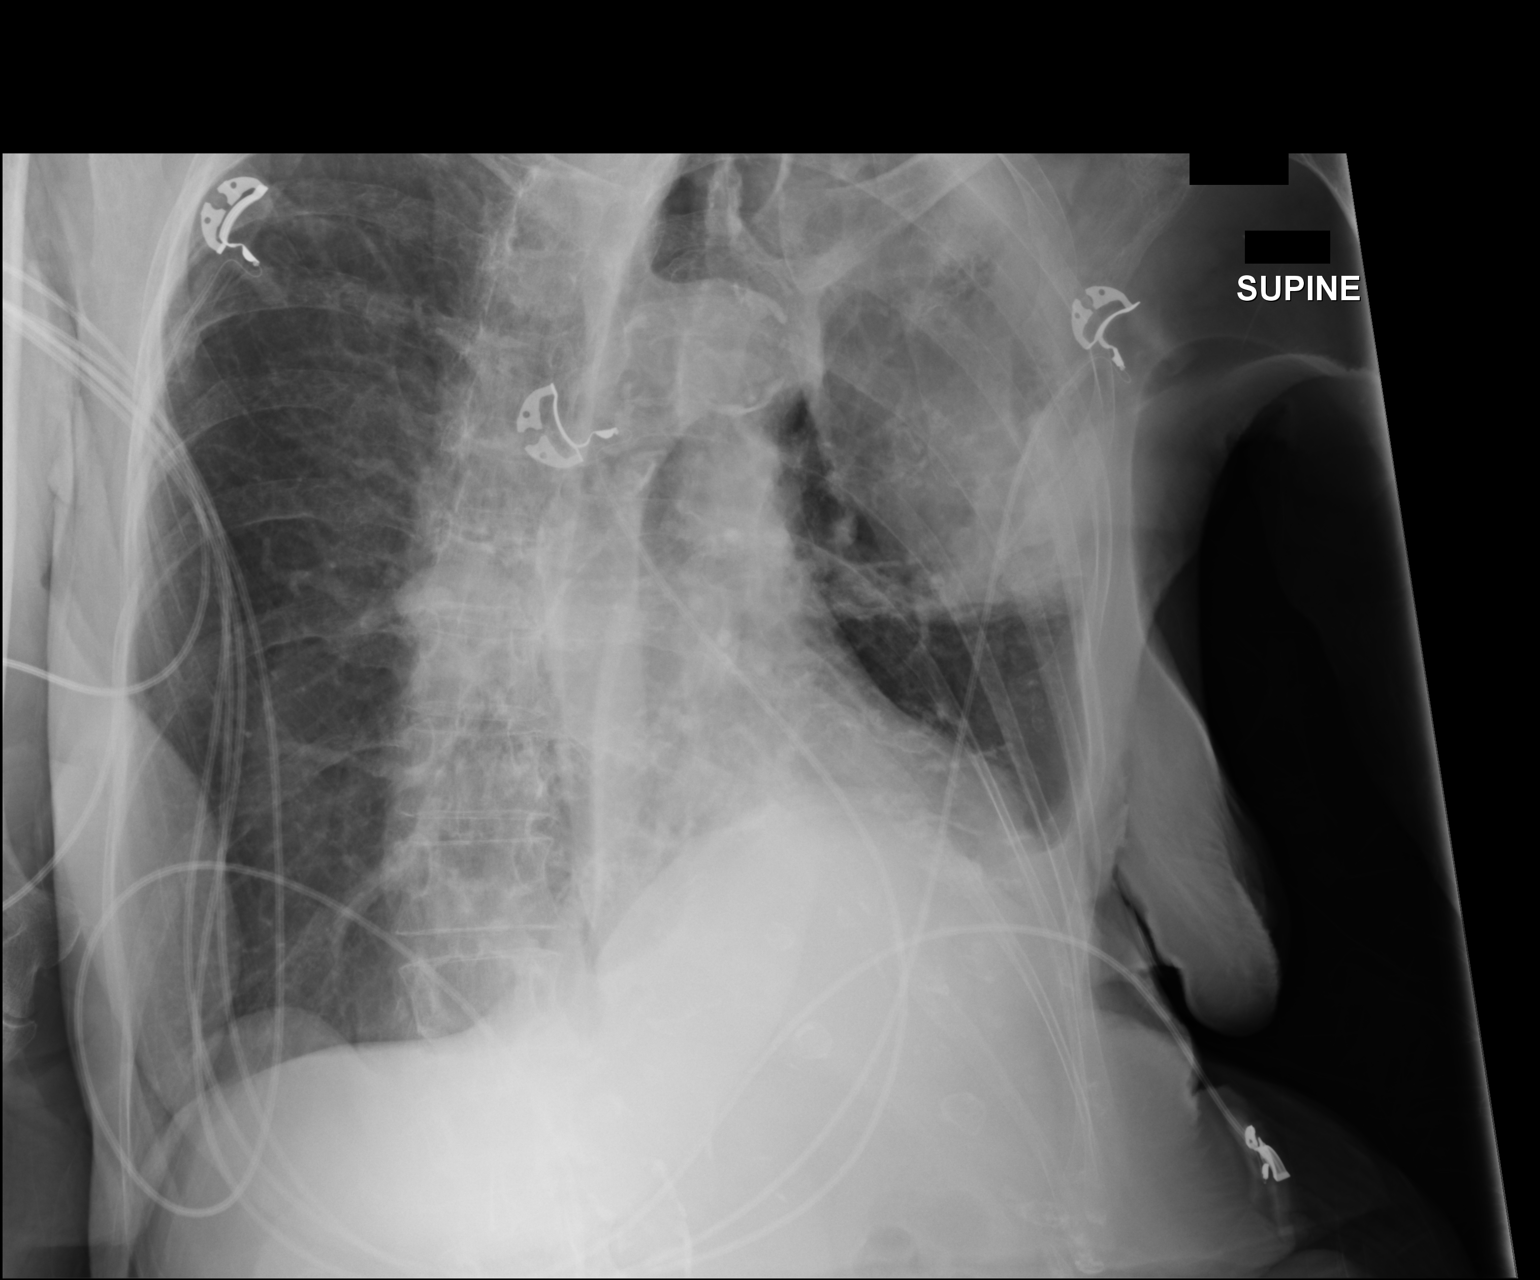

[2 of 2 positions shown; findings below may reference images not displayed]

FINDINGS: Left upper lobe cavity and consolidation is without significant
change from the prior study allowing for differences in positioning
and technique.

Probable small left pleural effusion.

Right lung is hyperexpanded but clear.
IMPRESSION: Left upper lobe consolidation and apparent cavity. This is without
significant change from the prior exam. No new abnormalities.

## 2017-04-27 IMAGING — CR DG CHEST 1V PORT
1 series · 1 of 1 positions shown · non-contrast
Comparison: Prior radiographs 11/15/2015, and 11/13/2015

CLINICAL DATA: Aspiration into airway.  Cough today.

EXAM:
PORTABLE CHEST 1 VIEW

[AP]
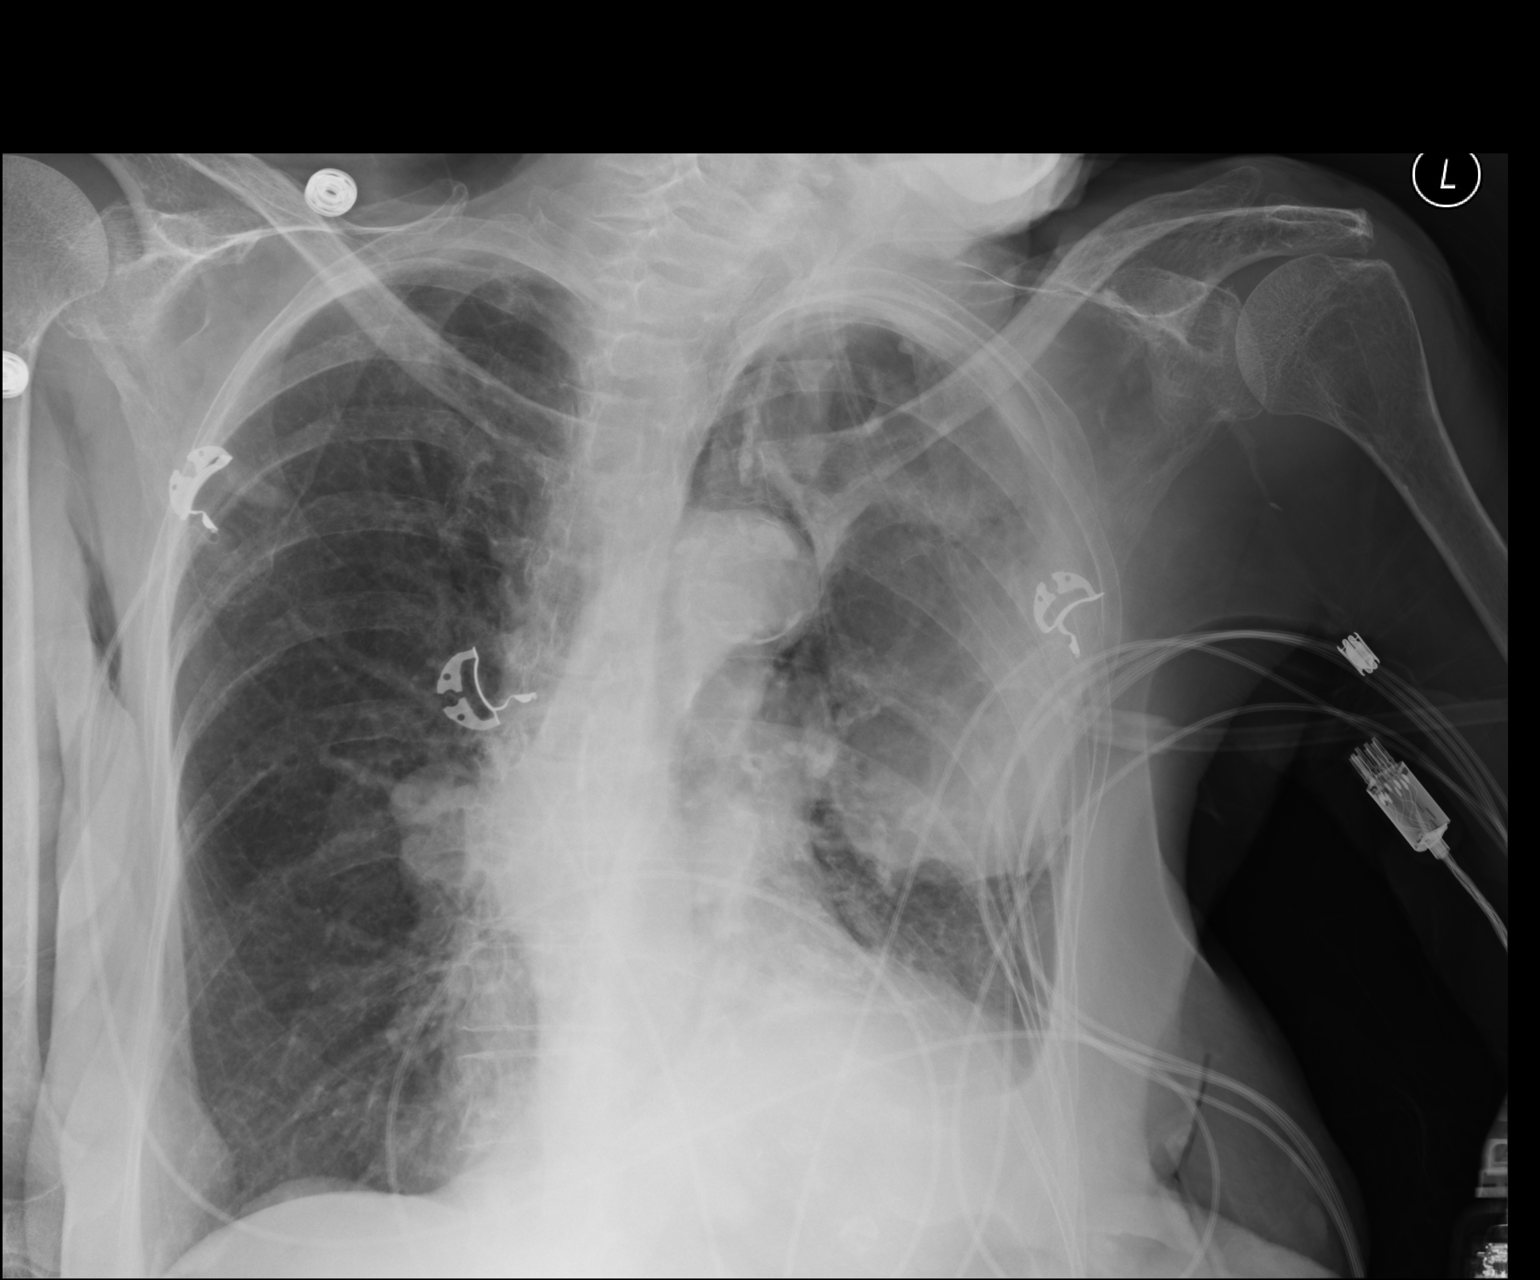

[1 of 1 positions shown; findings below may reference images not displayed]

FINDINGS: Left upper lobe consolidation with probable cavitary component, with
perhaps decreased density peripherally compared to prior. Volume
loss in the left hemithorax with probable left pleural effusion and
left basilar opacity, unchanged. Patient remains rotated. No new
abnormality in the right lung. Rounded soft tissue density
projecting over the right lung base may be related to external soft
tissue from the patient's arm, however attention to this at
follow-up is recommended. Cardiomediastinal contours are unchanged.
IMPRESSION: 1. Left upper lobe opacity with apparent cavitation, with perhaps
decreased density in the periphery compared to prior exams.
Unchanged volume loss in the left lung with probable left pleural
effusion.
2. No focal abnormality in the right lung to suggest aspiration.
Apparent rounded soft tissue density about the right lung base is
likely external and related to patient's arm, however attention to
this at follow-up recommended.

## 2017-04-28 IMAGING — CR DG CHEST 1V PORT
1 series · 1 of 1 positions shown · non-contrast
Comparison: 11/16/2015

CLINICAL DATA: Pneumonia

EXAM:
PORTABLE CHEST 1 VIEW

[AP]
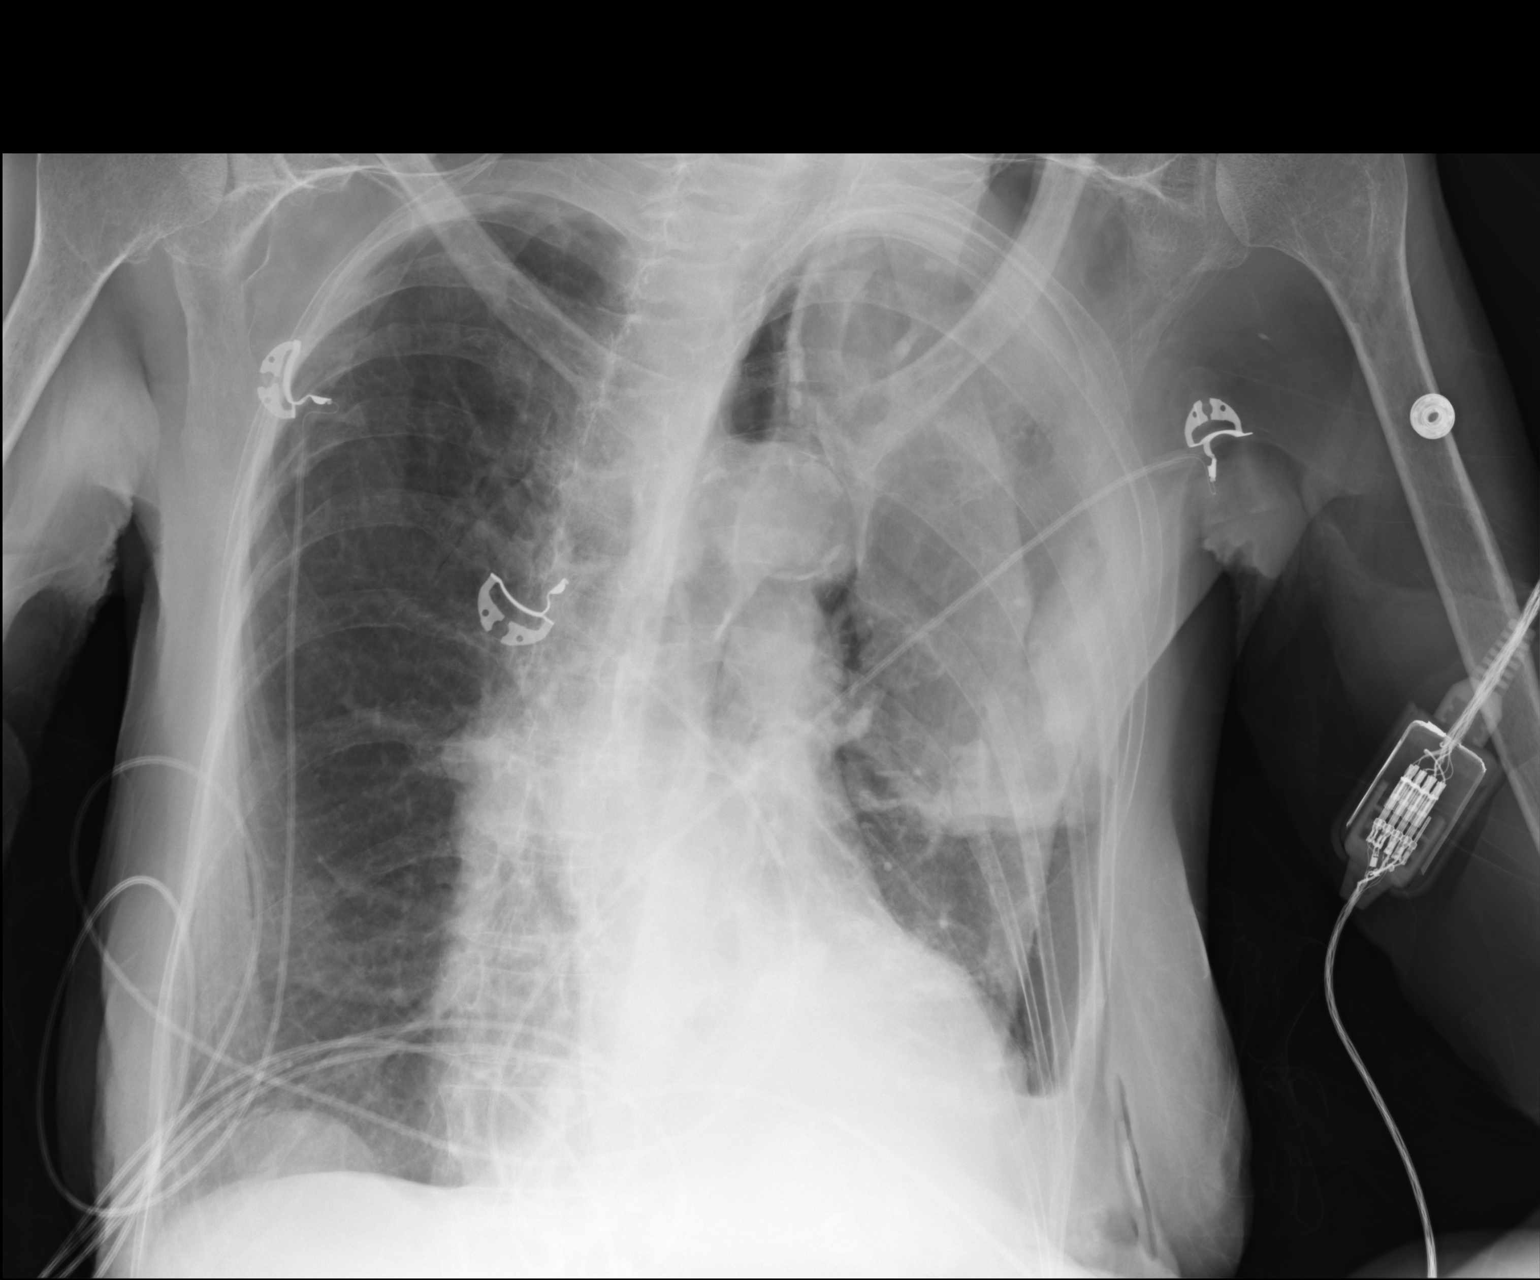

[1 of 1 positions shown; findings below may reference images not displayed]

FINDINGS: Continued dense consolidation in the left upper lobe with possible
cavitation. Small to moderate left pleural effusion. No focal
opacity on the right. Heart is borderline in size. There is
hyperinflation of the lungs compatible with COPD.
IMPRESSION: No significant change since prior study.
